# Patient Record
Sex: Female | Born: 1995 | Race: White | Hispanic: No | Marital: Married | Smoking: Never smoker
Health system: Southern US, Community
[De-identification: ages and names within clinical notes are randomized; demographics above are authoritative.]

## PROBLEM LIST (undated history)

## (undated) DIAGNOSIS — O3680X0 Pregnancy with inconclusive fetal viability, not applicable or unspecified: Secondary | ICD-10-CM

## (undated) DIAGNOSIS — Z8041 Family history of malignant neoplasm of ovary: Secondary | ICD-10-CM

## (undated) DIAGNOSIS — O009 Unspecified ectopic pregnancy without intrauterine pregnancy: Secondary | ICD-10-CM

## (undated) DIAGNOSIS — K661 Hemoperitoneum: Secondary | ICD-10-CM

## (undated) HISTORY — DX: Unspecified ectopic pregnancy without intrauterine pregnancy: O00.90

## (undated) HISTORY — DX: Family history of malignant neoplasm of ovary: Z80.41

## (undated) HISTORY — PX: ARTHROSCOPIC REPAIR ACL: SUR80

## (undated) MED ORDER — ERYTHROMYCIN 5 MG/G EYE OINTMENT
5 mg/gram (0. %) | OPHTHALMIC | Status: AC
Start: ? — End: 2013-05-17

## (undated) MED ORDER — DEXTROMETHORPHAN-GUAIFENESIN SR 30 MG-600 MG 12 HR TAB
600-30 mg | ORAL_TABLET | Freq: Two times a day (BID) | ORAL | Status: DC
Start: ? — End: 2014-03-29

---
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
## 2014-03-29 ENCOUNTER — Inpatient Hospital Stay
Admit: 2014-03-29 | Discharge: 2014-03-30 | Disposition: A | Discharge status: Home / Self Care | Payer: BLUE CROSS/BLUE SHIELD | Attending: Emergency Medicine

## 2014-03-29 DIAGNOSIS — N390 Urinary tract infection, site not specified: Secondary | ICD-10-CM

## 2014-03-29 NOTE — ED Notes (Signed)
Had a fever yesterday and headaches x 3 days and body aches with vomiting yesterday.  VOmiting x 6 in last 24 hours.

## 2014-03-29 NOTE — ED Notes (Signed)
Discharged by provider.

## 2014-03-29 NOTE — ED Provider Notes (Signed)
HPI Comments: Deanna Hooper is a 18 y.o. female who presents ambulatory to ER with c/o nausea, vomiting, and headache since past few days. Notes headache began approx 3 days ago and has been off and on in nature. Notes began with nausea, vomiting, and diarrhea yesterday. Denies any abdominal pain, numbness, weakness, blurred vision, and other visual changes. Denies taking anything for sx at home. States nausea and vomiting increasing over the past day and states "I can't keep anything down". Notes multiple people in household with similar sx that began after her sx. Denies any other complaints.   She specifically denies any fevers, chills, chest pain, shortness of breath, rash,  abdominal pain, urinary/bowel changes, sweating or weight loss.    PCP: None   PMHx significant for: Past Medical History:    MRSA infection                                                PSHx significant for: Past Surgical History:    HX ORTHOPAEDIC                                                Social Hx: Tobacco use: denies; EtOH use: denies; Illicit Drug use: denies     -- Chlorhexidine -- Unknown (comments)    --  States she's allergic    There are no other complaints, changes or physical findings at this time.        The history is provided by the patient.        Past Medical History:   Diagnosis Date   ??? MRSA infection        Past Surgical History:   Procedure Laterality Date   ??? Hx orthopaedic           History reviewed. No pertinent family history.    History     Social History   ??? Marital Status: SINGLE     Spouse Name: N/A     Number of Children: N/A   ??? Years of Education: N/A     Occupational History   ??? Not on file.     Social History Main Topics   ??? Smoking status: Never Smoker    ??? Smokeless tobacco: Never Used   ??? Alcohol Use: No   ??? Drug Use: No   ??? Sexual Activity: Not on file     Other Topics Concern   ??? Not on file     Social History Narrative                ALLERGIES: Chlorhexidine      Review of Systems    Constitutional: Negative.  Negative for fever, chills, appetite change and fatigue.   HENT: Negative for congestion and sore throat.    Eyes: Negative.  Negative for visual disturbance.   Respiratory: Negative.  Negative for cough and shortness of breath.    Cardiovascular: Negative.  Negative for chest pain, palpitations and leg swelling.   Gastrointestinal: Positive for nausea and vomiting. Negative for abdominal pain, diarrhea and constipation.   Genitourinary: Negative.  Negative for dysuria, hematuria, flank pain, vaginal bleeding and vaginal discharge.   Musculoskeletal: Negative.  Negative for back pain and neck pain.  Skin: Negative.  Negative for rash.   Neurological: Positive for headaches. Negative for dizziness, syncope, weakness and numbness.   Hematological: Negative.    Psychiatric/Behavioral: Negative.    All other systems reviewed and are negative.      Filed Vitals:    03/29/14 1903   BP: 119/71   Pulse: 91   Temp: 98.9 ??F (37.2 ??C)   Resp: 18   Height: 5\' 8"  (1.727 m)   Weight: 79.379 kg (175 lb)   SpO2: 98%            Physical Exam   Constitutional: She is oriented to person, place, and time. She appears well-developed and well-nourished. No distress.   HENT:   Head: Normocephalic and atraumatic.   Right Ear: Hearing, tympanic membrane, external ear and ear canal normal.   Left Ear: Hearing, tympanic membrane, external ear and ear canal normal.   Nose: Nose normal. Right sinus exhibits no maxillary sinus tenderness and no frontal sinus tenderness. Left sinus exhibits no maxillary sinus tenderness and no frontal sinus tenderness.   Mouth/Throat: Uvula is midline, oropharynx is clear and moist and mucous membranes are normal. No oropharyngeal exudate, posterior oropharyngeal edema, posterior oropharyngeal erythema or tonsillar abscesses.   Eyes: Conjunctivae and EOM are normal. Pupils are equal, round, and reactive to light.   Neck: Normal range of motion. Neck supple.    Cardiovascular: Normal rate, S1 normal, S2 normal, normal heart sounds, intact distal pulses and normal pulses.  Exam reveals no gallop and no friction rub.    No murmur heard.  Pulses:       Dorsalis pedis pulses are 2+ on the right side, and 2+ on the left side.   Pulmonary/Chest: Effort normal and breath sounds normal. No accessory muscle usage. No respiratory distress. She has no decreased breath sounds. She has no wheezes. She has no rhonchi. She has no rales.   Abdominal: Soft. Normal appearance and bowel sounds are normal. She exhibits no distension. There is no hepatosplenomegaly. There is no tenderness. There is no rebound, no guarding and no CVA tenderness.   Musculoskeletal: Normal range of motion. She exhibits no edema or tenderness.   Lymphadenopathy:     She has no cervical adenopathy.   Neurological: She is alert and oriented to person, place, and time. She has normal strength. No sensory deficit.   Skin: Skin is warm and dry. No rash noted. She is not diaphoretic. No erythema. No pallor.   Psychiatric: She has a normal mood and affect. Her behavior is normal.   Nursing note and vitals reviewed.       MDM  Number of Diagnoses or Management Options  Nausea with vomiting:   Urinary tract infection without hematuria, site unspecified:   Diagnosis management comments: DDx: UTI, gastroenteritis, URI, sinusitis       Amount and/or Complexity of Data Reviewed  Clinical lab tests: ordered and reviewed  Discuss the patient with other providers: yes    Patient Progress  Patient progress: stable      Procedures                                                    10:03 PM  Pt has been reevaluated. There are no new complaints, changes, or physical findings at this time. Medications have been reviewed w/ pt and/or  family. Pt and/or family's questions have been answered. Pt and/or family expressed good understanding of the dx/tx/rx and is in agreement with plan  of care. Pt instructed and agreed to f/u w/ PCP and to return to ED upon further deterioration. Pt is ready for discharge.    LABORATORY TESTS:  Recent Results (from the past 12 hour(s))   URINALYSIS W/ REFLEX CULTURE    Collection Time: 03/29/14  7:29 PM   Result Value Ref Range    Color YELLOW/STRAW      Appearance CLEAR CLEAR      Specific gravity 1.025 1.003 - 1.030      pH (UA) 6.5 5.0 - 8.0      Protein TRACE (A) NEG mg/dL    Glucose NEGATIVE  NEG mg/dL    Ketone TRACE (A) NEG mg/dL    Bilirubin NEGATIVE  NEG      Blood NEGATIVE  NEG      Urobilinogen 1.0 0.2 - 1.0 EU/dL    Nitrites NEGATIVE  NEG      Leukocyte Esterase SMALL (A) NEG      WBC 5-10 0 - 4 /hpf    RBC 0-5 0 - 5 /hpf    Epithelial cells MANY (A) FEW /lpf    Bacteria 2+ (A) NEG /hpf    UA:UC IF INDICATED URINE CULTURE ORDERED (A) CNI      Mucus 2+ (A) NEG /lpf   HCG URINE, QL. - POC    Collection Time: 03/29/14  9:51 PM   Result Value Ref Range    Pregnancy test,urine (POC) NEGATIVE  NEG         IMAGING RESULTS:  NA    MEDICATIONS GIVEN:  Medications   ondansetron (ZOFRAN) injection 4 mg (4 mg IntraVENous Given 03/29/14 2155)   butalbital-acetaminophen-caffeine (FIORICET, ESGIC) per tablet 1 Tab (1 Tab Oral Given 03/29/14 2155)       IMPRESSION:  1. Urinary tract infection without hematuria, site unspecified    2. Nausea with vomiting        PLAN:  1. Rx keflex, zofran  2. F/u with PCP      Return to ED if worse

## 2014-03-29 NOTE — ED Notes (Deleted)
Pt. Ambulatory to the bathroom.

## 2014-03-30 LAB — URINALYSIS W/ REFLEX CULTURE
Bilirubin: NEGATIVE
Blood: NEGATIVE
Glucose: NEGATIVE mg/dL
Nitrites: NEGATIVE
Specific gravity: 1.025 (ref 1.003–1.030)
Urobilinogen: 1 EU/dL (ref 0.2–1.0)
pH (UA): 6.5 (ref 5.0–8.0)

## 2014-03-30 LAB — HCG URINE, QL. - POC: Pregnancy test,urine (POC): NEGATIVE

## 2014-03-30 MED ORDER — ONDANSETRON (PF) 4 MG/2 ML INJECTION
4 mg/2 mL | INTRAMUSCULAR | Status: AC
Start: 2014-03-30 — End: 2014-03-29
  Administered 2014-03-30: 03:00:00 via INTRAVENOUS

## 2014-03-30 MED ORDER — CEPHALEXIN 500 MG CAP
500 mg | ORAL_CAPSULE | Freq: Three times a day (TID) | ORAL | Status: AC
Start: 2014-03-30 — End: 2014-04-05

## 2014-03-30 MED ORDER — BUTALBITAL-ACETAMINOPHEN-CAFFEINE 50 MG-325 MG-40 MG TAB
50-325-40 mg | ORAL | Status: AC
Start: 2014-03-30 — End: 2014-03-29
  Administered 2014-03-30: 03:00:00 via ORAL

## 2014-03-30 MED ORDER — ONDANSETRON 4 MG TAB, RAPID DISSOLVE
4 mg | ORAL_TABLET | Freq: Three times a day (TID) | ORAL | Status: DC | PRN
Start: 2014-03-30 — End: 2015-06-04

## 2014-03-30 MED FILL — BUTALBITAL-ACETAMINOPHEN-CAFFEINE 50 MG-325 MG-40 MG TAB: 50-325-40 mg | ORAL | Qty: 1

## 2014-03-30 MED FILL — ONDANSETRON (PF) 4 MG/2 ML INJECTION: 4 mg/2 mL | INTRAMUSCULAR | Qty: 2

## 2014-03-31 LAB — CULTURE, URINE
Colonies Counted: >100000
Colony Count: >100000

## 2015-06-04 ENCOUNTER — Inpatient Hospital Stay
Admit: 2015-06-04 | Discharge: 2015-06-05 | Disposition: A | Discharge status: Home / Self Care | Payer: BLUE CROSS/BLUE SHIELD | Attending: Emergency Medicine

## 2015-06-04 DIAGNOSIS — R112 Nausea with vomiting, unspecified: Secondary | ICD-10-CM

## 2015-06-04 LAB — URINALYSIS W/ REFLEX CULTURE
Bilirubin: NEGATIVE
Blood: NEGATIVE
Glucose: NEGATIVE mg/dL
Ketone: NEGATIVE mg/dL
Leukocyte Esterase: NEGATIVE
Nitrites: NEGATIVE
Protein: NEGATIVE mg/dL
Specific gravity: 1.03 (ref 1.003–1.030)
Urobilinogen: 1 EU/dL (ref 0.2–1.0)
pH (UA): 6 (ref 5.0–8.0)

## 2015-06-04 LAB — METABOLIC PANEL, COMPREHENSIVE
A-G Ratio: 0.9 — ABNORMAL LOW (ref 1.1–2.2)
ALT (SGPT): 23 U/L (ref 12–78)
AST (SGOT): 15 U/L (ref 15–37)
Albumin: 3.6 g/dL (ref 3.5–5.0)
Alk. phosphatase: 69 U/L (ref 45–117)
Anion gap: 8 mmol/L (ref 5–15)
BUN/Creatinine ratio: 14 (ref 12–20)
BUN: 11 MG/DL (ref 6–20)
Bilirubin, total: 0.2 MG/DL (ref 0.2–1.0)
CO2: 27 mmol/L (ref 21–32)
Calcium: 8.4 MG/DL — ABNORMAL LOW (ref 8.5–10.1)
Chloride: 105 mmol/L (ref 97–108)
Creatinine: 0.81 MG/DL (ref 0.55–1.02)
GFR est AA: >60 mL/min/{1.73_m2} (ref >60)
GFR est non-AA: >60 mL/min/{1.73_m2} (ref >60)
Globulin: 3.8 g/dL (ref 2.0–4.0)
Glucose: 96 mg/dL (ref 65–100)
Potassium: 4 mmol/L (ref 3.5–5.1)
Protein, total: 7.4 g/dL (ref 6.4–8.2)
Sodium: 140 mmol/L (ref 136–145)

## 2015-06-04 LAB — CBC WITH AUTOMATED DIFF
ABS. BASOPHILS: 0 10*3/uL (ref 0.0–0.1)
ABS. EOSINOPHILS: 0.2 10*3/uL (ref 0.0–0.4)
ABS. LYMPHOCYTES: 1 10*3/uL (ref 0.8–3.5)
ABS. MONOCYTES: 0.5 10*3/uL (ref 0.0–1.0)
ABS. NEUTROPHILS: 3.6 10*3/uL (ref 1.8–8.0)
BASOPHILS: 0 % (ref 0–1)
EOSINOPHILS: 3 % (ref 0–7)
HCT: 40.4 % (ref 35.0–47.0)
HGB: 12.6 g/dL (ref 11.5–16.0)
LYMPHOCYTES: 20 % (ref 12–49)
MCH: 27.5 PG (ref 26.0–34.0)
MCHC: 31.2 g/dL (ref 30.0–36.5)
MCV: 88.2 FL (ref 80.0–99.0)
MONOCYTES: 9 % (ref 5–13)
NEUTROPHILS: 68 % (ref 32–75)
PLATELET: 219 10*3/uL (ref 150–400)
RBC: 4.58 M/uL (ref 3.80–5.20)
RDW: 13.1 % (ref 11.5–14.5)
WBC: 5.3 10*3/uL (ref 3.6–11.0)

## 2015-06-04 LAB — HCG URINE, QL. - POC: Pregnancy test,urine (POC): NEGATIVE

## 2015-06-04 LAB — LIPASE: Lipase: 174 U/L (ref 73–393)

## 2015-06-04 MED ORDER — ONDANSETRON 4 MG TAB, RAPID DISSOLVE
4 mg | ORAL_TABLET | Freq: Three times a day (TID) | ORAL | 0 refills | Status: AC | PRN
Start: 2015-06-04 — End: ?

## 2015-06-04 MED ORDER — ONDANSETRON (PF) 4 MG/2 ML INJECTION
4 mg/2 mL | INTRAMUSCULAR | Status: AC
Start: 2015-06-04 — End: 2015-06-04
  Administered 2015-06-04: 23:00:00 via INTRAVENOUS

## 2015-06-04 MED ORDER — DICYCLOMINE 10 MG/ML IM SOLN
10 mg/mL | INTRAMUSCULAR | Status: AC
Start: 2015-06-04 — End: 2015-06-04
  Administered 2015-06-04: 23:00:00 via INTRAMUSCULAR

## 2015-06-04 MED ORDER — SODIUM CHLORIDE 0.9% BOLUS IV
0.9 % | Freq: Once | INTRAVENOUS | Status: AC
Start: 2015-06-04 — End: 2015-06-04
  Administered 2015-06-04: 23:00:00 via INTRAVENOUS

## 2015-06-04 MED ORDER — DICYCLOMINE 20 MG TAB
20 mg | ORAL_TABLET | Freq: Four times a day (QID) | ORAL | 0 refills | Status: AC
Start: 2015-06-04 — End: 2015-06-09

## 2015-06-04 MED FILL — BENTYL 10 MG/ML INTRAMUSCULAR SOLUTION: 10 mg/mL | INTRAMUSCULAR | Qty: 2

## 2015-06-04 MED FILL — ONDANSETRON (PF) 4 MG/2 ML INJECTION: 4 mg/2 mL | INTRAMUSCULAR | Qty: 2

## 2015-06-04 MED FILL — SODIUM CHLORIDE 0.9 % IV: INTRAVENOUS | Qty: 1000

## 2015-06-04 NOTE — ED Notes (Signed)
Patient c/o n/v/d since Sunday 0200.  She stated she can not keep any food down but liquid.

## 2015-06-04 NOTE — ED Provider Notes (Signed)
HPI Comments: PAtient presents with nausea, vomiting and diarrhea x 2 days. Patient works at [employer] that has had norovirus. Patient denies fever, chills or abdominal pain. Patient denies any significant medical history.     Patient is a 20 y.o. female presenting with vomiting and diarrhea. The history is provided by the patient.   Vomiting    This is a new problem. The current episode started 2 days ago. The problem has not changed since onset.The emesis has an appearance of stomach contents. There has been no fever. Associated symptoms include diarrhea. Pertinent negatives include no chills, no fever, no sweats, no abdominal pain, no headaches, no arthralgias, no myalgias, no cough, no URI and no headaches. The patient is not pregnant.Her pertinent negatives include no irritable bowel syndrome, no inflammatory bowel disease, no short gut syndrome, no bowel resection, no recent abdominal surgery, no malabsorption, no gastric bypass and no DM.   Diarrhea    Associated symptoms include diarrhea and vomiting. Pertinent negatives include no fever, no headaches, no arthralgias and no myalgias. Her past medical history does not include irritable bowel syndrome or DM.        Past Medical History:   Diagnosis Date   ??? MRSA infection        Past Surgical History:   Procedure Laterality Date   ??? Hx orthopaedic           No family history on file.    Social History     Social History   ??? Marital status: SINGLE     Spouse name: N/A   ??? Number of children: N/A   ??? Years of education: N/A     Occupational History   ??? Not on file.     Social History Main Topics   ??? Smoking status: Never Smoker   ??? Smokeless tobacco: Never Used   ??? Alcohol use No   ??? Drug use: No   ??? Sexual activity: Not on file     Other Topics Concern   ??? Not on file     Social History Narrative         ALLERGIES: Chlorhexidine    Review of Systems   Constitutional: Negative.  Negative for chills and fever.   HENT: Negative.    Eyes: Negative.     Respiratory: Negative.  Negative for cough.    Cardiovascular: Negative.    Gastrointestinal: Positive for diarrhea and vomiting. Negative for abdominal pain.   Endocrine: Negative.    Genitourinary: Negative.    Musculoskeletal: Negative.  Negative for arthralgias and myalgias.   Skin: Negative.    Allergic/Immunologic: Negative.    Neurological: Negative.  Negative for headaches.   Hematological: Negative.    Psychiatric/Behavioral: Negative.    All other systems reviewed and are negative.      Vitals:    06/04/15 1805 06/04/15 1815 06/04/15 1830 06/04/15 1845   BP:  123/66 120/59 121/61   Pulse:       Resp:       Temp:       SpO2: 99% 100%     Weight:       Height:                Physical Exam   Constitutional: She is oriented to person, place, and time. She appears well-developed and well-nourished.  Non-toxic appearance. She does not have a sickly appearance. She does not appear ill. No distress.   Patient is well appearing and in no acute  distress. Patient is non-toxic appearing.      HENT:   Head: Normocephalic and atraumatic.   Right Ear: External ear normal.   Left Ear: External ear normal.   Mouth/Throat: Oropharynx is clear and moist. No oropharyngeal exudate.   Eyes: Conjunctivae and EOM are normal. Pupils are equal, round, and reactive to light. Right eye exhibits no discharge. Left eye exhibits no discharge. No scleral icterus.   Neck: Normal range of motion. No tracheal deviation present. No thyromegaly present.   Cardiovascular: Normal rate, regular rhythm and normal heart sounds.    No murmur heard.  Pulmonary/Chest: Effort normal and breath sounds normal. No respiratory distress. She has no wheezes. She has no rales. She exhibits no tenderness.   Abdominal: Soft. Normal appearance and bowel sounds are normal. She exhibits no distension. There is no tenderness. There is no rebound and no guarding.   Musculoskeletal: Normal range of motion. She exhibits no edema or tenderness.   Lymphadenopathy:      She has no cervical adenopathy.   Neurological: She is alert and oriented to person, place, and time. No cranial nerve deficit. Coordination normal.   Skin: Skin is warm. No erythema.   Psychiatric: She has a normal mood and affect. Her behavior is normal. Judgment and thought content normal.   Nursing note and vitals reviewed.       MDM  Number of Diagnoses or Management Options  Nausea and vomiting, intractability of vomiting not specified, unspecified vomiting type:   Diagnosis management comments: Assesment/Plan- negative work up in ED. Patient feeling improved. Discharge with PCP follow up.       Amount and/or Complexity of Data Reviewed  Clinical lab tests: ordered and reviewed      ED Course       Procedures

## 2015-06-04 NOTE — ED Notes (Signed)
Patient discharged by PA. Patient given the opportunity to ask questions, verbalized understanding of teaching. PA

## 2015-06-06 LAB — CULTURE, URINE
Colonies Counted: 40000
Colony Count: 40000

## 2016-07-10 DIAGNOSIS — F4310 Post-traumatic stress disorder, unspecified: Secondary | ICD-10-CM | POA: Insufficient documentation

## 2016-10-03 ENCOUNTER — Encounter

## 2016-10-03 ENCOUNTER — Encounter: Admit: 2016-10-03

## 2016-10-03 ENCOUNTER — Ambulatory Visit: Admit: 2016-10-03 | Discharge: 2016-10-03 | Payer: PRIVATE HEALTH INSURANCE | Attending: Obstetrics & Gynecology

## 2016-10-03 ENCOUNTER — Ambulatory Visit: Admit: 2016-10-03 | Discharge: 2016-10-03 | Payer: PRIVATE HEALTH INSURANCE

## 2016-10-03 DIAGNOSIS — O3680X0 Pregnancy with inconclusive fetal viability, not applicable or unspecified: Secondary | ICD-10-CM

## 2016-10-03 DIAGNOSIS — Z3400 Encounter for supervision of normal first pregnancy, unspecified trimester: Secondary | ICD-10-CM

## 2016-10-03 NOTE — Progress Notes (Signed)
[facility]  [email or web address]  [phone]    Alfonse Ras, MD, FACOG     Chief complaint:  Irregular cycles  Last cycle; Patient's last menstrual period was 07/30/2016.    This is a new concern and an evaluation is planned.    Current pregnancy history:  Deanna Hooper is a No obstetric history on file., 21 y.o. female WHITE OR CAUCASIAN   She presents for the evaluation of irregular menses and a positive pregnancy test.    LMP history:  Patient's last menstrual period was 07/30/2016..  The date of the beginning of her last menstrual period is certain.    Her menses are regular.    Her cycles occur about every 4 weeks.  A urine pregnancy test was positive about 4 weeks ago.   She was not using contraception at the estimated time of conception.  Pt stopped OCP 1 month prior.    Based on her LMP, her EGA is 9 weeks,2 days with and EDC of 05/16/17.     Ultrasound data:  She had an ultrasound today which revealed a viable singleton pregnancy with a gestational age of [redacted] weeks and 3 days giving an EDC of 05/12/17.    Pregnancy symptoms:  She reports breast tenderness  nausea  positive home pregnancy test.  She denies fatigue  "morning sickness"  frequent urination  Pelvic pain  Vaginal bleeding.  Since she found out she is pregnant, she has gained, 20 lbs.  She reports her prepregnancy weight as 216 pounds.    Relevant past pregnancy history:  She has the following pregnancy history:none.  She does not have a history of preterm delivery.  She does not have a history of a prior cesarean section.    Relevant past medical history:(relevant to this pregnancy):   none     Pap smear history:  Last pap smear: pt has never had one.   Results: N/A    Occupational history  Her occupation is: unemployed.    Substance history:   She does not report current tobacco use.           She does not report current alcohol use.  She does not report current drug use.    Exposure history:    There are not indoor cat(s) in the home.  The patient was instructed not to change cat litter boxes during pregnancy.  She does report close contact with children on a regular basis.   She has chicken pox or the vaccine in the past.   Patient does not report issues with domestic violence.     Genetic Screening/Teratology Counseling:   (Includes patient, baby's father, or anyone in either family with:)  1.  Patient's age >/= 46 at EDC?--21      FOB age: 21 years old.   2.  Thalassemia ([place], [place], Mediterranean, or Asian background): MCV<80?--no  3.  Neural tube defect (meningomyelocele, spina bifida, anencephaly)?--fob: spinal defect? (stenosis and scoliosis)  4.  Congenital heart defect?--pt fob: Aortic stenosis  5.  Down syndrome?--no  6.  Tay-Sachs (Jewish, [place]--no  7.  Canavan's Disease?--no  8.  Familial Dysautonomia?--no   9.  Sickle cell disease or trait (African)?--no   Has she been tested for sickle trait: No  10.  Hemophilia or other blood disorders?--no  11.  Muscular dystrophy?--no  12.  Cystic fibrosis?--no  13.  Huntington's Chorea?--no  14.  Mental retardation/autism (if yes was person tested for Fragile X)?-no  15.  Other  inherited genetic or chromosomal disorder?- no  16.  Maternal metabolic disorder (DM, PKU, etc)?--no  17.  Patient or FOB with a child with a birth defect not listed above?--no  17a.  Patient or FOB with a birth defect themselves?--no  18.  Recurrent pregnancy loss, or stillbirth?--no  19.  Any medications since LMP other than prenatal vitamins (include vitamins, supplements, OTC meds, drugs, alcohol)?--   PNV only  20.  Any other genetic/environmental exposure to discuss?--no.    Infection History:  1.  Lives with someone with TB or TB exposed?--no  2.  Patient or partner has history of genital herpes?--no  3.  Rash or viral illness since LMP?--no  4.  History of STD (GC, CT, HPV, syphilis, HIV)?--no   5.  Have you received a flu vaccine for the most recent flu season? -- no  6.  Have you or your sexual partner(s) travelled to a [place] invested area in the last 3 months? -- no    Past Medical History:   Diagnosis Date   ??? MRSA infection      Past Surgical History:   Procedure Laterality Date   ??? HX ORTHOPAEDIC  2003    right wrist   ??? HX ORTHOPAEDIC  10/06/2012    right knee/ ACL     Social History     Occupational History   ??? Not on file.     Social History Main Topics   ??? Smoking status: Never Smoker   ??? Smokeless tobacco: Never Used   ??? Alcohol use No   ??? Drug use: No   ??? Sexual activity: Yes     Partners: Male     Birth control/ protection: None     Family History   Problem Relation Age of Onset   ??? Hypertension Father    ??? Diabetes Father    ??? Heart Disease Father      aortic stenosis, mechanical heart valve   ??? Other Father      gastroparesis   ??? Diabetes Sister      OB History   Gravida Para Term Preterm AB Living   1        SAB TAB Ectopic Molar Multiple Live Births              # Outcome Date GA Lbr Len/2nd Weight Sex Delivery Anes PTL Lv   1 Current                 Allergies   Allergen Reactions   ??? Chlorhexidine Unknown (comments)     States she's allergic     Prior to Admission medications    Medication Sig Start Date End Date Taking? Authorizing Provider   PNV no.95/ferrous fum/folic ac (PRENATAL PO) Take  by mouth.   Yes Historical Provider   ondansetron (ZOFRAN ODT) 4 mg disintegrating tablet Take 1 Tab by mouth every eight (8) hours as needed for Nausea. 06/04/15   Salomon Fick, PA        Review of Systems - History obtained from the patient  Constitutional: negative for weight loss, fever, night sweats  HEENT: negative for hearing loss, earache, congestion, snoring, sorethroat  CV: negative for chest pain, palpitations, edema  Resp: negative for cough, shortness of breath, wheezing  GI: negative for change in bowel habits, abdominal pain, black or bloody stools   GU: negative for frequency, dysuria, hematuria, vaginal discharge  MSK: negative for back pain, joint pain, muscle pain  Breast: negative  for breast lumps, nipple discharge, galactorrhea  Skin :negative for itching, rash, hives  Neuro: negative for dizziness, headache, confusion, weakness  Psych: negative for anxiety, depression, change in mood  Heme/lymph: negative for bleeding, bruising, pallor    Objective:  Visit Vitals   ??? BP 118/74   ??? Ht 5\' 8"  (1.727 m)   ??? Wt 236 lb (107 kg)   ??? LMP 07/30/2016   ??? BMI 35.88 kg/m2       Physical Exam:   Constitutional  ?? Appearance: well-nourished, well developed, alert, in no acute distress    HENT  ?? Head  ?? Face: appears normal  ?? Eyes: appear normal  ?? Ears: normal  ?? Mouth: normal  ?? Lips: no lesions    Neck  ?? Inspection/Palpation: normal appearance, no masses or tenderness  ?? Lymph Nodes: no lymphadenopathy present  ?? Thyroid: gland size normal, nontender, no nodules or masses present on palpation    Chest  ?? Respiratory Effort: breathing unlabored  ?? Auscultation: normal breath sounds    Cardiovascular  ?? Heart:  ?? Auscultation: regular rate and rhythm without murmur    Breasts  ?? Inspection of Breasts: breasts symmetrical, no skin changes, no discharge present, nipple appearance normal, no skin retraction present  ?? Palpation of Breasts and Axillae: no masses present on palpation, no breast tenderness  ?? Axillary Lymph Nodes: no lymphadenopathy present    Gastrointestinal  ?? Abdominal Examination: abdomen non-tender to palpation, normal bowel sounds, no masses present  ?? Liver and spleen: no hepatomegaly present, spleen not palpable  ?? Hernias: no hernias identified    Genitourinary  ?? External Genitalia: normal appearance for age, no discharge present, no tenderness present, no inflammatory lesions present, no masses present, no atrophy present  ?? Vagina: normal vaginal vault without central or paravaginal defects, no  discharge present, no inflammatory lesions present, no masses present  ?? Bladder: non-tender to palpation  ?? Urethra: appears normal  ?? Cervix: normal appearing with discharge or lesions, os closed  ?? Uterus: enlarged, normal shape, soft  ?? Adnexa: no adnexal tenderness present, no adnexal masses present  ?? Perineum: perineum within normal limits, no evidence of trauma, no rashes or skin lesions present  ?? Anus: anus within normal limits, no hemorrhoids present  ?? Inguinal Lymph Nodes: no lymphadenopathy present    Skin  ?? General Inspection: no rash, no lesions identified    Neurologic/Psychiatric  ?? Mental Status:  ?? Orientation: grossly oriented to person, place and time  ?? Mood and Affect: mood normal, affect appropriate    Assessment:   Irregular cycles  Encounter Diagnosis   Name Primary?   ??? Supervision of normal first pregnancy, antepartum Yes   FH spinal problem and aortic stenosis.  nauses  Due date: [place] EDC: stopped OCP    Plan:   We discussed options of genetic screening and diagnostic testing including:  CF testing, CVS, amniocentesis first trimester screening/NT, MSAFP, and NIPT (handout given to patient for review and consent)  She is not interested in prenatal genetic testing of her fetus, but she will notify us if desires testing/screening  Plan: FS  The course of pregnancy discussed including visit schedule, ultrasounds, lab testing, etc.  Pt advised to avoid alcoholic beverages and illicit/recreational drugs use  Recommend taking prenatal vitamins or folic acid daily with DHA/fish oil.  The hospital and practice style discussed with coverage system.  We discussed nutrition, toxoplasmosis precautions, sexual activity, exercise, need for influenza vaccine,  environmental and work hazards, travel advice, screen for domestic violence, need for seat belts.  We discussed seafood, unpasteurized dairy products, deli meat, artificial sweeteners, and caffeine intake.     We recommend avoiding chemical and toxin exposures when possible.   Information on prenatal and breastfeeding classes given.  Information on circumcision given  Patient encouraged not to smoke.  Discussed current prescription drug use. Given medication list.  Discussed the use of over the counter medications and chemicals.  She is advised to contact her MD with any questions.     Pt understands risk of hemorrhage during pregnancy and post delivery and would accept blood products if necessary in life-threatening emergencies  We discussed signs and symptoms of abnormal pregnancies and miscarriage.  Handouts given to pt.  N/v handout, consider diclegis if NI    Physician review of ultrasound performed by technician  Today's ultrasound report and images were reviewed and discussed with the patient.  Please see images and imaging report entered by technician in PACS for more detail and progress note and diagnosis entered by MD.    Baruch MerlAMARA Levia Waltermire, MD    TA ULTRASOUND PERFORMED.  A SINGLE VIABLE [redacted]W[redacted]D IUP IS SEEN WITH NORMAL CARDIAC RHYTHM.  GESTATIONAL AGE BASED ON TODAYS US.  A NORMAL APPEARING YOLK SAC IS SEEN.  RIGHT & LEFT OVARIES APPEAR WITHIN NORMAL LIMITS.  NO FREE FLUID SEEN IN THE CDS.    Orders Placed This Encounter   ??? CULTURE, URINE   ??? HEP B SURFACE AG   ??? HIV SCREEN, 4TH GEN. W/REFLEX CONFIRM   ??? CBC W/O DIFF   ??? RUBELLA AB, IGG   ??? T PALLIDUM SCREEN W/REFLEX   ??? TYPE, ABO & RH   ??? ANTIBODY SCREEN   ??? PNV no.95/ferrous fum/folic ac (PRENATAL PO)   ??? PAP IG, CT-NG, RFX HPV [id number])     Follow-up Disposition: Not on File

## 2016-10-03 NOTE — Progress Notes (Signed)
Physician review of ultrasound performed by technician    Today's ultrasound report and images were reviewed and discussed with the patient.  Please see images and imaging report entered by technician in PACS for more detail and progress note and diagnosis entered by MD.    Jamariah Tony, MD

## 2016-10-03 NOTE — Patient Instructions (Signed)
Weeks 6 to 10 of Your Pregnancy: Care Instructions  Your Care Instructions    Congratulations on your pregnancy. This is an exciting and important time for you.  During the first 6 to 10 weeks of your pregnancy, your body goes through many changes. Your baby grows very fast, even though you cannot feel it yet. You may start to notice that you feel different, both in your body and your emotions. Because each woman's pregnancy is unique, there is no right way to feel. You may feel the healthiest you have ever been, or you may feel tired or sick to your stomach ("morning sickness").  These early weeks are a time to make healthy choices and to eat the best foods for you and your baby. This care sheet will give you some ideas.  This is also a good time to think about birth defects testing. These are tests done during pregnancy to look for possible problems with the baby. First trimester tests for birth defects can be done between 10 and 13 weeks of pregnancy, depending on the test. Talk with your doctor about what kinds of tests are available.  Follow-up care is a key part of your treatment and safety. Be sure to make and go to all appointments, and call your doctor if you are having problems. It's also a good idea to know your test results and keep a list of the medicines you take.  How can you care for yourself at home?  Eat well  ?? Eat at least 3 meals and 2 healthy snacks every day. Eat fresh, whole foods, including:  ?? 7 or more servings of bread, tortillas, cereal, rice, pasta, or oatmeal.  ?? 3 or more servings of vegetables, especially leafy green vegetables.  ?? 2 or more servings of fruits.  ?? 3 or more servings of milk, yogurt, or cheese.  ?? 2 or more servings of meat, turkey, chicken, fish, eggs, or dried beans.  ?? Drink plenty of fluids, especially water. Avoid sodas and other sweetened drinks.  ?? Choose foods that have important vitamins for your baby, such as calcium, iron, and folate.   ?? Dairy products, tofu, canned fish with bones, almonds, broccoli, dark leafy greens, corn tortillas, and fortified orange juice are good sources of calcium.  ?? Beef, poultry, liver, spinach, lentils, dried beans, fortified cereals, and dried fruits are rich in iron.  ?? Dark leafy greens, broccoli, asparagus, liver, fortified cereals, orange juice, peanuts, and almonds are good sources of folate.  ?? Avoid foods that could harm your baby.  ?? Do not eat raw or undercooked meat, chicken, or fish (such as sushi or raw oysters).  ?? Do not eat raw eggs or foods that contain raw eggs, such as Caesar dressing.  ?? Do not eat soft cheeses and unpasteurized dairy foods, such as Brie, feta, or blue cheese.  ?? Do not eat fish that contains a lot of mercury, such as shark, swordfish, tilefish, or king mackerel. Do not eat more than 6 ounces of tuna each week.  ?? Do not eat raw sprouts, especially alfalfa sprouts.  ?? Cut down on caffeine, such as coffee, tea, and cola.  Protect yourself and your baby  ?? Do not touch kitty litter or cat feces. They can cause an infection that could harm your baby.  ?? High body temperature can be harmful to your baby. So if you want to use a sauna or hot tub, be sure to talk to your doctor   about how to use it safely.  Cope with morning sickness  ?? Sip small amounts of water, juices, or shakes. Try drinking between meals, not with meals.  ?? Eat 5 or 6 small meals a day. Try dry toast or crackers when you first get up, and eat breakfast a little later.  ?? Avoid spicy, greasy, and fatty foods.  ?? When you feel sick, open your windows or go for a short walk to get fresh air.  ?? Try nausea wristbands. These help some women.  ?? Tell your doctor if you think your prenatal vitamins make you sick.  Where can you learn more?  Go to http://www.healthwise.net/[employer]Help[employer]ctions.  Enter G112 in the search box to learn more about "Weeks 6 to 10 of Your Pregnancy: Care Instructions."   Current as of: July 12, 2015  Content Version: 11.4  ?? 2006-2017 [employer]. Care instructions adapted under license by [employer] (which disclaims liability or warranty for this information). If you have questions about a medical condition or this instruction, always ask your healthcare professional. Healthwise, Incorporated disclaims any warranty or liability for your use of this information.

## 2016-10-05 LAB — CULTURE, URINE
Urine Culture, Routine: NO GROWTH
Urine Culture, Routine: NO GROWTH

## 2016-10-05 NOTE — Progress Notes (Signed)
Normal results, add to prenatal records.   We can review in detail with patient at next visit.

## 2016-10-07 LAB — PAP IG, CT-NG, RFX HPV ASCU(183194, 507301)
.: 0
CHLAMYDIA, NUC. ACID AMP, 186114: NEGATIVE
Chlamydia, Nuc. Acid Amp.: NEGATIVE
GONOCOCCUS, NUC. ACID AMP, 186122: NEGATIVE
Gonococcus, Nuc. Acid Amp.: NEGATIVE
LABCORP 019018: 0

## 2016-10-07 LAB — BLOOD TYPE, (ABO+RH)
Rh (D): POSITIVE
Rh Type: POSITIVE

## 2016-10-07 LAB — HIV 1/2 ANTIGEN/ANTIBODY, FOURTH GENERATION W/RFL: HIV Screen 4th Generation wRfx: NONREACTIVE

## 2016-10-07 LAB — HIV-1 AB, EXTERNAL
HIV, EXTERNAL: NEGATIVE
HIV, External: NEGATIVE

## 2016-10-07 LAB — TYPE, ABO & RH, EXTERNAL
ABO,Rh: B POS
TYPE, ABO & RH, EXTERNAL: B POS

## 2016-10-07 LAB — PAP SMEAR, EXTERNAL: Pap smear, External: NEGATIVE

## 2016-10-07 LAB — CBC W/O DIFF
HCT: 37.2 % (ref 34.0–46.6)
HGB: 12.6 g/dL (ref 11.1–15.9)
MCH: 28.5 pg (ref 26.6–33.0)
MCHC: 33.9 g/dL (ref 31.5–35.7)
MCV: 84 fL (ref 79–97)
PLATELET: 218 10*3/uL (ref 150–379)
RBC: 4.42 x10E6/uL (ref 3.77–5.28)
RDW: 13.7 % (ref 12.3–15.4)
WBC: 8.4 10*3/uL (ref 3.4–10.8)

## 2016-10-07 LAB — ANTIBODY SCREEN: Antibody screen: NEGATIVE

## 2016-10-07 LAB — N GONORRHOEAE, DNA PROBE, EXTERNAL: Gonorrhea, External: NEGATIVE

## 2016-10-07 LAB — URINALYSIS W/O MICRO, EXTERNAL: Urine, External: NEGATIVE

## 2016-10-07 LAB — HIV 1/2 AG/AB, 4TH GENERATION,W RFLX CONFIRM: HIV SCREEN 4TH GENERATION WRFX: NONREACTIVE

## 2016-10-07 LAB — HEPATITIS B SURFACE AG, EXTERNAL: HBsAg, External: NEGATIVE

## 2016-10-07 LAB — CHLAMYDIA DNA PROBE, EXTERNAL: Chlamydia, External: NEGATIVE

## 2016-10-07 LAB — RUBELLA AB, IGG, EXTERNAL: Rubella, External: NON-IMMUNE/NOT IMMUNE

## 2016-10-07 LAB — T PALLIDUM AB, EXTERNAL: T. Pallidum Antibody, External: NEGATIVE

## 2016-10-07 LAB — PLATELET, EXTERNAL: Platelet cnt., External: 218

## 2016-10-07 LAB — T PALLIDUM SCREEN W/REFLEX: T PALLIDUM AB: NEGATIVE

## 2016-10-07 LAB — HEMOGLOBIN, EXTERNAL: Hgb, External: 12.6

## 2016-10-07 LAB — RUBELLA AB, IGG: Rubella Ab, IgG: <0.9 index — ABNORMAL LOW (ref >0.99)

## 2016-10-07 LAB — HEP B SURFACE AG: Hep B surface Ag screen: NEGATIVE

## 2016-10-07 LAB — ANTIBODY SCREEN, EXTERNAL: Antibody screen, External: NEGATIVE

## 2016-10-07 LAB — HEMATOCRIT, EXTERNAL: Hct, External: 37.2

## 2016-10-07 NOTE — Progress Notes (Signed)
Abnormal results.  Please notify pt.appears to be RUBELLA non immune, other pnlabs wnl, can discuss in detail at [place]  Update PNL, encourage MC use and sign up  Notify pt: rubella NI, we will repeat this test at 28 weeks if she thinks he was vaccinated in the past and if still non immune will recommend MMR vaccine prior to leaving the hospital after delivery: add to PL  This vaccine cannot be given while pregnant

## 2016-10-10 DIAGNOSIS — Z3400 Encounter for supervision of normal first pregnancy, unspecified trimester: Secondary | ICD-10-CM

## 2016-10-10 HISTORY — DX: Encounter for supervision of normal first pregnancy, unspecified trimester: Z34.00

## 2016-10-10 NOTE — Progress Notes (Signed)
Patient aware of results and MD recommendations by phone.

## 2016-10-10 NOTE — Progress Notes (Signed)
LMTCB

## 2016-10-10 NOTE — Progress Notes (Signed)
PNL/PL updated.

## 2018-06-27 HISTORY — PX: DILATION AND EVACUATION: SHX1459

## 2018-07-12 ENCOUNTER — Other Ambulatory Visit: Payer: Self-pay

## 2018-07-12 ENCOUNTER — Encounter: Payer: Self-pay | Admitting: *Deleted

## 2018-07-12 ENCOUNTER — Emergency Department
Admission: EM | Admit: 2018-07-12 | Discharge: 2018-07-12 | Disposition: A | Discharge status: Home / Self Care | Payer: Medicaid Other | Attending: Emergency Medicine | Admitting: Emergency Medicine

## 2018-07-12 ENCOUNTER — Emergency Department: Payer: Medicaid Other

## 2018-07-12 DIAGNOSIS — O209 Hemorrhage in early pregnancy, unspecified: Secondary | ICD-10-CM

## 2018-07-12 DIAGNOSIS — Z3A00 Weeks of gestation of pregnancy not specified: Secondary | ICD-10-CM | POA: Diagnosis not present

## 2018-07-12 DIAGNOSIS — R109 Unspecified abdominal pain: Secondary | ICD-10-CM | POA: Diagnosis not present

## 2018-07-12 DIAGNOSIS — O208 Other hemorrhage in early pregnancy: Secondary | ICD-10-CM | POA: Insufficient documentation

## 2018-07-12 DIAGNOSIS — O26899 Other specified pregnancy related conditions, unspecified trimester: Secondary | ICD-10-CM | POA: Diagnosis not present

## 2018-07-12 LAB — COMPREHENSIVE METABOLIC PANEL
ALT: 13 U/L (ref 0–44)
AST: 21 U/L (ref 15–41)
Albumin: 4.4 g/dL (ref 3.5–5.0)
Alkaline Phosphatase: 72 U/L (ref 38–126)
Anion gap: 9 (ref 5–15)
BUN: 9 mg/dL (ref 6–20)
CO2: 23 mmol/L (ref 22–32)
Calcium: 9 mg/dL (ref 8.9–10.3)
Chloride: 107 mmol/L (ref 98–111)
Creatinine, Ser: 0.66 mg/dL (ref 0.44–1.00)
GFR calc Af Amer: >60 mL/min (ref >60)
GFR calc non Af Amer: >60 mL/min (ref >60)
Glucose, Bld: 91 mg/dL (ref 70–99)
Potassium: 4.1 mmol/L (ref 3.5–5.1)
Sodium: 139 mmol/L (ref 135–145)
Total Bilirubin: 0.6 mg/dL (ref 0.3–1.2)
Total Protein: 7.3 g/dL (ref 6.5–8.1)

## 2018-07-12 LAB — CBC WITH DIFFERENTIAL/PLATELET
ABS IMMATURE GRANULOCYTES: 0.02 10*3/uL (ref 0.00–0.07)
Basophils Absolute: 0 10*3/uL (ref 0.0–0.1)
Basophils Relative: 0 %
Eosinophils Absolute: 0.1 10*3/uL (ref 0.0–0.5)
Eosinophils Relative: 1 %
HCT: 41 % (ref 36.0–46.0)
Hemoglobin: 13.3 g/dL (ref 12.0–15.0)
Immature Granulocytes: 0 %
Lymphocytes Relative: 23 %
Lymphs Abs: 1.3 10*3/uL (ref 0.7–4.0)
MCH: 28.9 pg (ref 26.0–34.0)
MCHC: 32.4 g/dL (ref 30.0–36.0)
MCV: 88.9 fL (ref 80.0–100.0)
Monocytes Absolute: 0.4 10*3/uL (ref 0.1–1.0)
Monocytes Relative: 8 %
NEUTROS ABS: 3.8 10*3/uL (ref 1.7–7.7)
Neutrophils Relative %: 68 %
PLATELETS: 229 10*3/uL (ref 150–400)
RBC: 4.61 MIL/uL (ref 3.87–5.11)
RDW: 12.8 % (ref 11.5–15.5)
WBC: 5.6 10*3/uL (ref 4.0–10.5)
nRBC: 0 % (ref 0.0–0.2)

## 2018-07-12 LAB — ABO/RH: ABO/RH(D): B POS

## 2018-07-12 LAB — HCG, QUANTITATIVE, PREGNANCY: hCG, Beta Chain, Quant, S: 515 m[IU]/mL — ABNORMAL HIGH (ref <5)

## 2018-07-12 LAB — POCT PREGNANCY, URINE
PREG TEST UR: POSITIVE — AB
Preg Test, Ur: POSITIVE — AB

## 2018-07-12 NOTE — ED Notes (Signed)
Patient transported to Ultrasound 

## 2018-07-12 NOTE — ED Triage Notes (Signed)
Pt states that her last regular menstrual period was  1/8. She had a positive pregnancy test on Saturday and she is now having bleeding that is more than a regular period.  She states that she had some spotting on 2/4.  Pt is also having cramping that feels like labor.

## 2018-07-12 NOTE — ED Provider Notes (Addendum)
[facility] Emergency Department Provider Note   ____________________________________________   First MD Initiated Contact with Patient 07/12/18 1515     (approximate)  I have reviewed the triage vital signs and the nursing notes.   HISTORY  Chief Complaint Threatened Miscarriage    HPI Heather Jacobs is a 23 y.o. female G2 P1-0-0-1.  Patient reports last regular menstrual period was on January 8 she had small brief episode of spotting in the beginning of February and little bit of spotting again March 3.  She is now having more spotting and cramping.  Pregnancy test is positive.  Quant is 515.         History reviewed. No pertinent past medical history.  There are no active problems to display for this patient.   Past Surgical History:  Procedure Laterality Date  . ARTHROSCOPIC REPAIR ACL      Prior to Admission medications   Not on File    Allergies Patient has no known allergies.  History reviewed. No pertinent family history.  Social History Social History   Tobacco Use  . Smoking status: Never Smoker  . Smokeless tobacco: Never Used  Substance Use Topics  . Alcohol use: Never    Frequency: Never  . Drug use: Never    Review of Systems  Constitutional: No fever/chills Eyes: No visual changes. ENT: No sore throat. Cardiovascular: Denies chest pain. Respiratory: Denies shortness of breath. Gastrointestinal: AP lower abdominal pain.  No nausea, no vomiting.  No diarrhea.  No constipation. Genitourinary: Negative for dysuria. Musculoskeletal: Negative for back pain. Skin: Negative for rash. Neurological: Negative for headaches, focal weakness    ____________________________________________   PHYSICAL EXAM:  VITAL SIGNS: ED Triage Vitals  Enc Vitals Group     BP 07/12/18 1411 140/89     Pulse Rate 07/12/18 1411 92     Resp 07/12/18 1411 16     Temp 07/12/18 1411 98.2 F (36.8 C)     Temp Source 07/12/18 1411  Oral     SpO2 07/12/18 1411 100 %     Weight 07/12/18 1412 220 lb (99.8 kg)     Height --      Head Circumference --      Peak Flow --      Pain Score 07/12/18 1411 4     Pain Loc --      Pain Edu? --      Excl. in GC? --     Constitutional: Alert and oriented. Well appearing and in no acute distress. Eyes: Conjunctivae are normal. PERRL. EOMI. Head: Atraumatic. Nose: No congestion/rhinnorhea. Mouth/Throat: Mucous membranes are moist.  Oropharynx non-erythematous. Neck: No stridor. Cardiovascular: Normal rate, regular rhythm. Grossly normal heart sounds.  Good peripheral circulation. Respiratory: Normal respiratory effort.  No retractions. Lungs CTAB. Gastrointestinal: Soft mild lower abd pain. No distention. No abdominal bruits. No CVA tenderness. Genitourinary: os closed, occasional drops of blood expressed from os. No tenderness when cervix manipulated w. Speculum. Musculoskeletal: No lower extremity tenderness nor edema.  No joint effusions. Neurologic:  Normal speech and language. No gross focal neurologic deficits are appreciated. No gait instability. Skin:  Skin is warm, dry and intact. No rash noted. Psychiatric: Mood and affect are normal. Speech and behavior are normal.  ____________________________________________   LABS (all labs ordered are listed, but only abnormal results are displayed)  Labs Reviewed  HCG, QUANTITATIVE, PREGNANCY - Abnormal; Notable for the following components:      Result Value   hCG,  Beta Chain, Quant, S 515 (*)    All other components within normal limits  POCT PREGNANCY, URINE - Abnormal; Notable for the following components:   Preg Test, Ur POSITIVE (*)    All other components within normal limits  POCT PREGNANCY, URINE - Abnormal; Notable for the following components:   Preg Test, Ur POSITIVE (*)    All other components within normal limits  CBC WITH DIFFERENTIAL/PLATELET  COMPREHENSIVE METABOLIC PANEL  POC URINE PREG, ED  ABO/RH    ____________________________________________  EKG   ____________________________________________  RADIOLOGY  ED MD interpretation: Ultrasound read by radiology reviewed by me shows per radiology small amount of free fluid cannot see if pregnancy cannot rule out an early pregnancy or an ectopic or miscarriage.  Official radiology report(s): No results found.  ____________________________________________   PROCEDURES  Procedure(s) performed (including Critical Care):  Procedures   ____________________________________________   INITIAL IMPRESSION / ASSESSMENT AND PLAN / ED COURSE  ----------------------------------------- 5:33 PM on 07/12/2018 -----------------------------------------  Discussed patient with Dr. Gaynelle Arabian Dr. Gaynelle Arabian wants to come down to see the patient this is great patient is getting ready. ----------------------------------------- 6:18 PM on 07/12/2018 -----------------------------------------  Dr. Gaynelle Arabian is seen the patient.  She will follow-up with the patient on Wednesday in the office.  Patient will return here for worse bleeding pain or cramping.  Also she is lightheaded.  Dr. Gaynelle Arabian thinks patient may have had a pregnancy in the tubes and miscarried it.  Have instructed the patient to return at once if she has worse pain bleeding or feels weak or lightheaded.  She will follow-up on Wednesday as I noted. On discharge patient vitals are stable.     ____________________________________________   FINAL CLINICAL IMPRESSION(S) / ED DIAGNOSES  Final diagnoses:  Vaginal bleeding affecting early pregnancy  Abdominal cramping complicating pregnancy     ED Discharge Orders    None       Note:  This document was prepared using Dragon voice recognition software and may include unintentional dictation errors.    Arnaldo Natal, MD 07/12/18 1821    Arnaldo Natal, MD 07/12/18 [phone]

## 2018-07-12 NOTE — ED Notes (Signed)
LMP 06/30/2018  "super light" No period February Regular period 05/05/2018

## 2018-07-12 NOTE — Discharge Instructions (Signed)
Please return here for worse pain fever vomiting heavier bleeding or if you get lightheaded or weak.  Please be sure to call [facility] and schedule an appointment on Wednesday.  Let them know you were seen in the ER and the Dr. Gaynelle Arabian saw you in the ER and wants you seen on Wednesday.  At this point it is impossible to tell if you are having a very early pregnancy that will continue to term or if you had a pregnancy in the tubes and have already miscarried at and are still having some bleeding or if you were having threatened miscarriage.  I have included the instructions for threatened miscarriage.  Please be sure to return at once if you get worse as I noted above.

## 2018-07-12 NOTE — ED Notes (Signed)
Called lab spoke to [facility] about add on for CBC and MetC. Peggy stated she would add on.

## 2018-07-14 ENCOUNTER — Encounter: Payer: Self-pay | Admitting: Obstetrics & Gynecology

## 2018-07-14 ENCOUNTER — Other Ambulatory Visit: Payer: Self-pay | Admitting: Obstetrics and Gynecology

## 2018-07-14 ENCOUNTER — Other Ambulatory Visit: Payer: Self-pay

## 2018-07-14 ENCOUNTER — Other Ambulatory Visit: Payer: Medicaid Other

## 2018-07-14 DIAGNOSIS — O3680X0 Pregnancy with inconclusive fetal viability, not applicable or unspecified: Secondary | ICD-10-CM

## 2018-07-15 ENCOUNTER — Encounter: Payer: Self-pay | Admitting: Obstetrics and Gynecology

## 2018-07-15 ENCOUNTER — Other Ambulatory Visit: Payer: Self-pay

## 2018-07-15 ENCOUNTER — Ambulatory Visit (INDEPENDENT_AMBULATORY_CARE_PROVIDER_SITE_OTHER): Payer: Medicaid Other | Admitting: Obstetrics and Gynecology

## 2018-07-15 VITALS — BP 118/70 | HR 85 | Ht 68.5 in | Wt 212.0 lb

## 2018-07-15 DIAGNOSIS — O3680X0 Pregnancy with inconclusive fetal viability, not applicable or unspecified: Secondary | ICD-10-CM

## 2018-07-15 LAB — BETA HCG QUANT (REF LAB): hCG Quant: 823 m[IU]/mL

## 2018-07-15 NOTE — Progress Notes (Signed)
Patient ID: Heather Jacobs, female   DOB: February 12, 1996, 23 y.o.   MRN: [id number]  Reason for Consult: Follow-up (ER follow up, any pressure on the abdomin causes pain, vaginal bleeding, nausea and fatigue )   Referred by No ref. provider found  Subjective:     HPI:  Heather Jacobs is a 23 y.o. female . She presents today for follow up from a recent ER visit. Beta hcg values have been increasing normally however she has continue vaginal bleeding and abdominal pain. She denies lightheadedness, dizziness or fainting. She denies nausea or vomiting. This is a desired pregnancy and she would like to avoid intervention until location or the pregnancy is determined. She is staying with family and has easy access/transport to the emergency room if needed.   Unknown LMP, she has been breastfeeding and her periods have been irregular.   History reviewed. No pertinent past medical history. History reviewed. No pertinent family history. Past Surgical History:  Procedure Laterality Date  . ARTHROSCOPIC REPAIR ACL      Short Social History:  Social History   Tobacco Use  . Smoking status: Never Smoker  . Smokeless tobacco: Never Used  Substance Use Topics  . Alcohol use: Never    Frequency: Never    Allergies  Allergen Reactions  . Chlorhexidine Other (See Comments)    States she's allergic    Current Outpatient Medications  Medication Sig Dispense Refill  . prenatal vitamin w/FE, FA (PRENATAL 1 + 1) 27-1 MG TABS tablet Take 1 tablet by mouth daily at 12 noon.     No current facility-administered medications for this visit.     Review of Systems  Constitutional: Negative for chills, fatigue, fever and unexpected weight change.  HENT: Negative for trouble swallowing.  Eyes: Negative for loss of vision.  Respiratory: Negative for cough, shortness of breath and wheezing.  Cardiovascular: Negative for chest pain, leg swelling, palpitations and syncope.  GI: Negative for abdominal  pain, blood in stool, diarrhea, nausea and vomiting.  GU: Negative for difficulty urinating, dysuria, frequency and hematuria.  Musculoskeletal: Negative for back pain, leg pain and joint pain.  Skin: Negative for rash.  Neurological: Negative for dizziness, headaches, light-headedness, numbness and seizures.  Psychiatric: Negative for behavioral problem, confusion, depressed mood and sleep disturbance.        Objective:  Objective   Vitals:   07/15/18 1436  BP: 118/70  Pulse: 85  Weight: 212 lb (96.2 kg)  Height: 5' 8.5" (1.74 m)   Body mass index is 31.77 kg/m.  Physical Exam Vitals signs and nursing note reviewed.  Constitutional:      Appearance: She is well-developed.  HENT:     Head: Normocephalic and atraumatic.  Eyes:     Pupils: Pupils are equal, round, and reactive to light.  Cardiovascular:     Rate and Rhythm: Normal rate and regular rhythm.  Pulmonary:     Effort: Pulmonary effort is normal. No respiratory distress.  Skin:    General: Skin is warm and dry.  Neurological:     Mental Status: She is alert and oriented to person, place, and time.  Psychiatric:        Behavior: Behavior normal.        Thought Content: Thought content normal.        Judgment: Judgment normal.        Assessment/Plan:     23 yo G2P1001  1. Pregnancy of unknown location. Unknown LMP.  This is a desired  pregnancy. She has been offered a diagnostic laparoscopy to help with determination of this being an ectopic pregnancy because of symptoms and [place] suggesting free fluid in cul de sac. She would like to avoid surgery if possible. So far, beta hcg has increased normally. She will continue to receive beta hcg levels every 48 hours. Will repeat US in 1 week to see if pregnancy location can be determined.   She was counseled in detail abut the risks of a ruptured ectopic pregnancy such as abdominal bleeding, death, hemorrhage, etc. She will practice ectopic pregnancy precautions by not  having intercourse, remaining close to medical treatment, not traveling, and not being alone at home.   More than 15 minutes were spent face to face with the patient in the room with more than 50% of the time spent providing counseling and discussing the plan of management.  Follow up in 1 week.  Follow up tomorrow for labs.   Adelene Idler MD [facility], [facility] 07/15/2018 3:10 PM

## 2018-07-16 ENCOUNTER — Other Ambulatory Visit: Payer: Self-pay

## 2018-07-16 ENCOUNTER — Other Ambulatory Visit: Payer: Medicaid Other

## 2018-07-16 DIAGNOSIS — O3680X0 Pregnancy with inconclusive fetal viability, not applicable or unspecified: Secondary | ICD-10-CM

## 2018-07-17 LAB — BETA HCG QUANT (REF LAB): hCG Quant: 1573 m[IU]/mL

## 2018-07-18 ENCOUNTER — Emergency Department
Admission: EM | Admit: 2018-07-18 | Discharge: 2018-07-18 | Disposition: A | Discharge status: Home / Self Care | Payer: Medicaid Other | Attending: Obstetrics & Gynecology | Admitting: Obstetrics & Gynecology

## 2018-07-18 ENCOUNTER — Encounter: Payer: Self-pay | Admitting: Emergency Medicine

## 2018-07-18 ENCOUNTER — Emergency Department: Payer: Medicaid Other | Admitting: Anesthesiology

## 2018-07-18 ENCOUNTER — Other Ambulatory Visit: Payer: Self-pay

## 2018-07-18 ENCOUNTER — Emergency Department: Payer: Medicaid Other

## 2018-07-18 ENCOUNTER — Encounter
Admission: EM | Disposition: A | Discharge status: Home / Self Care | Payer: Self-pay | Source: Home / Self Care | Attending: Emergency Medicine

## 2018-07-18 DIAGNOSIS — N838 Other noninflammatory disorders of ovary, fallopian tube and broad ligament: Secondary | ICD-10-CM | POA: Insufficient documentation

## 2018-07-18 DIAGNOSIS — O00101 Right tubal pregnancy without intrauterine pregnancy: Secondary | ICD-10-CM

## 2018-07-18 DIAGNOSIS — K661 Hemoperitoneum: Secondary | ICD-10-CM

## 2018-07-18 DIAGNOSIS — N83202 Unspecified ovarian cyst, left side: Secondary | ICD-10-CM | POA: Insufficient documentation

## 2018-07-18 DIAGNOSIS — R109 Unspecified abdominal pain: Secondary | ICD-10-CM | POA: Diagnosis present

## 2018-07-18 HISTORY — PX: LAPAROSCOPY: SHX197

## 2018-07-18 HISTORY — DX: Hemoperitoneum: K66.1

## 2018-07-18 HISTORY — DX: Right tubal pregnancy without intrauterine pregnancy: O00.101

## 2018-07-18 LAB — URINALYSIS, ROUTINE W REFLEX MICROSCOPIC
Bacteria, UA: NONE SEEN
Bilirubin Urine: NEGATIVE
GLUCOSE, UA: NEGATIVE mg/dL
Ketones, ur: NEGATIVE mg/dL
Leukocytes,Ua: NEGATIVE
Nitrite: NEGATIVE
PH: 7 (ref 5.0–8.0)
Protein, ur: NEGATIVE mg/dL
Specific Gravity, Urine: 1.021 (ref 1.005–1.030)

## 2018-07-18 LAB — HCG, QUANTITATIVE, PREGNANCY: hCG, Beta Chain, Quant, S: 3353 m[IU]/mL — ABNORMAL HIGH (ref <5)

## 2018-07-18 SURGERY — LAPAROSCOPY, DIAGNOSTIC
Anesthesia: General | Site: Abdomen

## 2018-07-18 MED ORDER — FENTANYL CITRATE (PF) 100 MCG/2ML IJ SOLN: 25.0000 ug | INTRAMUSCULAR | Status: DC | PRN

## 2018-07-18 MED ORDER — LIDOCAINE HCL (CARDIAC) PF 100 MG/5ML IV SOSY
PREFILLED_SYRINGE | INTRAVENOUS | Status: DC | PRN
  Administered 2018-07-18: 100 mg via INTRAVENOUS

## 2018-07-18 MED ORDER — MIDAZOLAM HCL 2 MG/2ML IJ SOLN
INTRAMUSCULAR | Status: AC
  Filled 2018-07-18: qty 2

## 2018-07-18 MED ORDER — DEXMEDETOMIDINE HCL 200 MCG/2ML IV SOLN
INTRAVENOUS | Status: DC | PRN
  Administered 2018-07-18: 8 ug via INTRAVENOUS
  Administered 2018-07-18: 12 ug via INTRAVENOUS

## 2018-07-18 MED ORDER — SUCCINYLCHOLINE CHLORIDE 20 MG/ML IJ SOLN
INTRAMUSCULAR | Status: DC | PRN
  Administered 2018-07-18: 80 mg via INTRAVENOUS

## 2018-07-18 MED ORDER — FENTANYL CITRATE (PF) 100 MCG/2ML IJ SOLN
INTRAMUSCULAR | Status: DC | PRN
  Administered 2018-07-18: 25 ug via INTRAVENOUS
  Administered 2018-07-18: 150 ug via INTRAVENOUS
  Administered 2018-07-18: 25 ug via INTRAVENOUS
  Administered 2018-07-18: 50 ug via INTRAVENOUS

## 2018-07-18 MED ORDER — MORPHINE SULFATE (PF) 2 MG/ML IV SOLN
2.0000 mg | Freq: Once | INTRAVENOUS | Status: AC
  Administered 2018-07-18: 2 mg via INTRAVENOUS
  Filled 2018-07-18: qty 1

## 2018-07-18 MED ORDER — KETOROLAC TROMETHAMINE 30 MG/ML IJ SOLN
30.0000 mg | Freq: Four times a day (QID) | INTRAMUSCULAR | Status: DC
  Filled 2018-07-18: qty 1

## 2018-07-18 MED ORDER — DEXMEDETOMIDINE HCL IN NACL 200 MCG/50ML IV SOLN
INTRAVENOUS | Status: AC
  Filled 2018-07-18: qty 50

## 2018-07-18 MED ORDER — ONDANSETRON HCL 4 MG/2ML IJ SOLN
INTRAMUSCULAR | Status: AC
  Filled 2018-07-18: qty 2

## 2018-07-18 MED ORDER — MIDAZOLAM HCL 2 MG/2ML IJ SOLN
INTRAMUSCULAR | Status: DC | PRN
  Administered 2018-07-18: 2 mg via INTRAVENOUS

## 2018-07-18 MED ORDER — LACTATED RINGERS IV SOLN: INTRAVENOUS | Status: DC

## 2018-07-18 MED ORDER — ONDANSETRON HCL 4 MG/2ML IJ SOLN: 4.0000 mg | Freq: Once | INTRAMUSCULAR | Status: DC | PRN

## 2018-07-18 MED ORDER — FENTANYL CITRATE (PF) 250 MCG/5ML IJ SOLN
INTRAMUSCULAR | Status: AC
  Filled 2018-07-18: qty 5

## 2018-07-18 MED ORDER — PROPOFOL 10 MG/ML IV BOLUS
INTRAVENOUS | Status: AC
  Filled 2018-07-18: qty 20

## 2018-07-18 MED ORDER — PROPOFOL 10 MG/ML IV BOLUS
INTRAVENOUS | Status: DC | PRN
  Administered 2018-07-18: 150 mg via INTRAVENOUS
  Administered 2018-07-18: 50 mg via INTRAVENOUS

## 2018-07-18 MED ORDER — ONDANSETRON HCL 4 MG/2ML IJ SOLN
INTRAMUSCULAR | Status: DC | PRN
  Administered 2018-07-18: 4 mg via INTRAVENOUS

## 2018-07-18 MED ORDER — SUGAMMADEX SODIUM 200 MG/2ML IV SOLN
INTRAVENOUS | Status: DC | PRN
  Administered 2018-07-18: 300 mg via INTRAVENOUS

## 2018-07-18 MED ORDER — SUGAMMADEX SODIUM 200 MG/2ML IV SOLN
INTRAVENOUS | Status: AC
  Filled 2018-07-18: qty 2

## 2018-07-18 MED ORDER — OXYCODONE-ACETAMINOPHEN 5-325 MG PO TABS: 1.0000 | ORAL_TABLET | ORAL | Status: DC | PRN

## 2018-07-18 MED ORDER — BUPIVACAINE HCL (PF) 0.5 % IJ SOLN
INTRAMUSCULAR | Status: DC | PRN
  Administered 2018-07-18: 12 mL

## 2018-07-18 MED ORDER — DEXAMETHASONE SODIUM PHOSPHATE 10 MG/ML IJ SOLN
INTRAMUSCULAR | Status: DC | PRN
  Administered 2018-07-18: 10 mg via INTRAVENOUS

## 2018-07-18 MED ORDER — BUPIVACAINE HCL (PF) 0.5 % IJ SOLN
INTRAMUSCULAR | Status: AC
  Filled 2018-07-18: qty 30

## 2018-07-18 MED ORDER — LACTATED RINGERS IV SOLN
INTRAVENOUS | Status: DC | PRN
  Administered 2018-07-18 (×2): via INTRAVENOUS

## 2018-07-18 MED ORDER — ROCURONIUM BROMIDE 100 MG/10ML IV SOLN
INTRAVENOUS | Status: DC | PRN
  Administered 2018-07-18: 10 mg via INTRAVENOUS
  Administered 2018-07-18: 30 mg via INTRAVENOUS

## 2018-07-18 MED ORDER — SUCCINYLCHOLINE CHLORIDE 20 MG/ML IJ SOLN
INTRAMUSCULAR | Status: AC
  Filled 2018-07-18: qty 1

## 2018-07-18 MED ORDER — LACTATED RINGERS IV SOLN
INTRAVENOUS | Status: AC | PRN
  Administered 2018-07-18: 300 mL via INTRAUTERINE

## 2018-07-18 MED ORDER — ONDANSETRON HCL 4 MG/2ML IJ SOLN
4.0000 mg | Freq: Once | INTRAMUSCULAR | Status: AC
  Administered 2018-07-18: 4 mg via INTRAVENOUS
  Filled 2018-07-18: qty 2

## 2018-07-18 MED ORDER — ACETAMINOPHEN 325 MG PO TABS: 650.0000 mg | ORAL_TABLET | ORAL | Status: DC | PRN

## 2018-07-18 MED ORDER — ACETAMINOPHEN 650 MG RE SUPP
650.0000 mg | RECTAL | Status: DC | PRN
  Filled 2018-07-18: qty 1

## 2018-07-18 MED ORDER — LIDOCAINE HCL (PF) 2 % IJ SOLN
INTRAMUSCULAR | Status: AC
  Filled 2018-07-18: qty 10

## 2018-07-18 MED ORDER — DEXAMETHASONE SODIUM PHOSPHATE 10 MG/ML IJ SOLN
INTRAMUSCULAR | Status: AC
  Filled 2018-07-18: qty 1

## 2018-07-18 MED ORDER — MORPHINE SULFATE (PF) 4 MG/ML IV SOLN: 1.0000 mg | INTRAVENOUS | Status: DC | PRN

## 2018-07-18 MED ORDER — OXYCODONE-ACETAMINOPHEN 5-325 MG PO TABS: 1.0000 | ORAL_TABLET | ORAL | 0 refills | Status: DC | PRN

## 2018-07-18 MED ORDER — ROCURONIUM BROMIDE 50 MG/5ML IV SOLN
INTRAVENOUS | Status: AC
  Filled 2018-07-18: qty 1

## 2018-07-18 SURGICAL SUPPLY — 39 items
BLADE SURG SZ11 CARB STEEL (BLADE) ×3 IMPLANT
CANISTER SUCT 1200ML W/VALVE (MISCELLANEOUS) ×3 IMPLANT
CATH ROBINSON RED A/P 16FR (CATHETERS) ×3 IMPLANT
CHLORAPREP W/TINT 26 (MISCELLANEOUS) IMPLANT
COVER WAND RF STERILE (DRAPES) IMPLANT
DERMABOND ADVANCED (GAUZE/BANDAGES/DRESSINGS) ×2
DERMABOND ADVANCED .7 DNX12 (GAUZE/BANDAGES/DRESSINGS) ×1 IMPLANT
DRSG TELFA 4X3 1S NADH ST (GAUZE/BANDAGES/DRESSINGS) IMPLANT
GLOVE BIO SURGEON STRL SZ8 (GLOVE) ×6 IMPLANT
GLOVE INDICATOR 8.0 STRL GRN (GLOVE) ×6 IMPLANT
GOWN STRL REUS W/ TWL LRG LVL3 (GOWN DISPOSABLE) ×1 IMPLANT
GOWN STRL REUS W/ TWL XL LVL3 (GOWN DISPOSABLE) ×1 IMPLANT
GOWN STRL REUS W/TWL LRG LVL3 (GOWN DISPOSABLE) ×2
GOWN STRL REUS W/TWL XL LVL3 (GOWN DISPOSABLE) ×2
GRASPER SUT TROCAR 14GX15 (MISCELLANEOUS) ×3 IMPLANT
IRRIGATION STRYKERFLOW (MISCELLANEOUS) ×1 IMPLANT
IRRIGATOR STRYKERFLOW (MISCELLANEOUS) ×3
IV LACTATED RINGERS 1000ML (IV SOLUTION) ×3 IMPLANT
KIT PINK PAD W/HEAD ARE REST (MISCELLANEOUS) ×3
KIT PINK PAD W/HEAD ARM REST (MISCELLANEOUS) ×1 IMPLANT
LABEL OR SOLS (LABEL) IMPLANT
NEEDLE VERESS 14GA 120MM (NEEDLE) ×3 IMPLANT
NS IRRIG 500ML POUR BTL (IV SOLUTION) ×3 IMPLANT
PACK GYN LAPAROSCOPIC (MISCELLANEOUS) ×3 IMPLANT
PAD PREP 24X41 OB/GYN DISP (PERSONAL CARE ITEMS) ×3 IMPLANT
POUCH SPECIMEN RETRIEVAL 10MM (ENDOMECHANICALS) IMPLANT
SCISSORS METZENBAUM CVD 33 (INSTRUMENTS) ×3 IMPLANT
SET TUBE SMOKE EVAC HIGH FLOW (TUBING) ×3 IMPLANT
SHEARS HARMONIC ACE PLUS 36CM (ENDOMECHANICALS) ×3 IMPLANT
SLEEVE ENDOPATH XCEL 5M (ENDOMECHANICALS) IMPLANT
SPONGE GAUZE 2X2 8PLY STER LF (GAUZE/BANDAGES/DRESSINGS)
SPONGE GAUZE 2X2 8PLY STRL LF (GAUZE/BANDAGES/DRESSINGS) IMPLANT
STRAP SAFETY 5IN WIDE (MISCELLANEOUS) ×3 IMPLANT
SUT VIC AB 2-0 UR6 27 (SUTURE) IMPLANT
SUT VIC AB 4-0 PS2 18 (SUTURE) IMPLANT
SUT VICRYL 0 AB UR-6 (SUTURE) ×3 IMPLANT
SYR 10ML LL (SYRINGE) ×3 IMPLANT
TROCAR ENDO BLADELESS 11MM (ENDOMECHANICALS) ×3 IMPLANT
TROCAR XCEL NON-BLD 5MMX100MML (ENDOMECHANICALS) ×3 IMPLANT

## 2018-07-18 NOTE — ED Notes (Signed)
Pelvic card is at bedside.

## 2018-07-18 NOTE — Anesthesia Preprocedure Evaluation (Signed)
Anesthesia Evaluation  Patient identified by MRN, date of birth, ID band Patient awake    Reviewed: Allergy & Precautions, NPO status , Patient's Chart, lab work & pertinent test results, reviewed documented beta blocker date and time   Airway Mallampati: II  TM Distance: >3 FB     Dental  (+) Chipped   Pulmonary           Cardiovascular      Neuro/Psych    GI/Hepatic   Endo/Other    Renal/GU      Musculoskeletal   Abdominal   Peds  Hematology   Anesthesia Other Findings   Reproductive/Obstetrics                             Anesthesia Physical Anesthesia Plan  ASA: II  Anesthesia Plan: General   Post-op Pain Management:    Induction: Intravenous  PONV Risk Score and Plan:   Airway Management Planned: Oral ETT  Additional Equipment:   Intra-op Plan:   Post-operative Plan:   Informed Consent: I have reviewed the patients History and Physical, chart, labs and discussed the procedure including the risks, benefits and alternatives for the proposed anesthesia with the patient or authorized representative who has indicated his/her understanding and acceptance.     Plan Discussed with: CRNA  Anesthesia Plan Comments:         Anesthesia Quick Evaluation  

## 2018-07-18 NOTE — Anesthesia Post-op Follow-up Note (Signed)
Anesthesia QCDR form completed.        

## 2018-07-18 NOTE — Anesthesia Postprocedure Evaluation (Signed)
Anesthesia Post Note  Patient: [name], academic  Procedure(s) Performed: LAPAROSCOPY DIAGNOSTIC,right salpingostomy (N/A Abdomen)  Patient location during evaluation: PACU Anesthesia Type: General Level of consciousness: awake and alert Pain management: pain level controlled Vital Signs Assessment: post-procedure vital signs reviewed and stable Respiratory status: spontaneous breathing, nonlabored ventilation, respiratory function stable and patient connected to nasal cannula oxygen Cardiovascular status: blood pressure returned to baseline and stable Postop Assessment: no apparent nausea or vomiting Anesthetic complications: no     Last Vitals:  Vitals:   07/18/18 0740 07/18/18 0741  BP: (!) 121/54 121/71  Pulse: 81 67  Resp: 18 17  Temp:    SpO2: 100% 100%    Last Pain:  Vitals:   07/18/18 0741  TempSrc:   PainSc: 0-No pain                 Astraea Gaughran S

## 2018-07-18 NOTE — Discharge Instructions (Signed)
Ectopic Pregnancy ° °An ectopic pregnancy is when the fertilized egg attaches (implants) outside the uterus. Most ectopic pregnancies occur in one of the tubes where eggs travel from the ovary to the uterus (fallopian tubes), but the implanting can occur in other locations. In rare cases, ectopic pregnancies occur on the ovary, intestine, pelvis, abdomen, or cervix. In an ectopic pregnancy, the fertilized egg does not have the ability to develop into a normal, healthy baby. °A ruptured ectopic pregnancy is one in which tearing or bursting of a fallopian tube causes internal bleeding. Often, there is intense lower abdominal pain, and vaginal bleeding sometimes occurs. Having an ectopic pregnancy can be life-threatening. If this dangerous condition is not treated, it can lead to blood loss, shock, or even death. °What are the causes? °The most common cause of this condition is damage to one of the fallopian tubes. A fallopian tube may be narrowed or blocked, and that keeps the fertilized egg from reaching the uterus. °What increases the risk? °This condition is more likely to develop in women of childbearing age who have different levels of risk. The levels of risk can be divided into three categories. °High risk °· You have gone through infertility treatment. °· You have had an ectopic pregnancy before. °· You have had surgery on the fallopian tubes, or another surgical procedure, such as an abortion. °· You have had surgery to have the fallopian tubes tied (tubal ligation). °· You have problems or diseases of the fallopian tubes. °· You have been exposed to diethylstilbestrol (DES). This medicine was used until 1971, and it had effects on babies whose mothers took the medicine. °· You become pregnant while using an IUD (intrauterine device) for birth control. °Moderate risk °· You have a history of infertility. °· You have had an STI (sexually transmitted infection). °· You have a history of pelvic inflammatory  disease (PID). °· You have scarring from endometriosis. °· You have multiple sexual partners. °· You smoke. °Low risk °· You have had pelvic surgery. °· You use vaginal douches. °· You became sexually active before age 18. °What are the signs or symptoms? °Common symptoms of this condition include normal pregnancy symptoms, such as missing a period, nausea, tiredness, abdominal pain, breast tenderness, and bleeding. However, ectopic pregnancy will have additional symptoms, such as: °· Pain with intercourse. °· Irregular vaginal bleeding or spotting. °· Cramping or pain on one side or in the lower abdomen. °· Fast heartbeat, low blood pressure, and sweating. °· Passing out while having a bowel movement. °Symptoms of a ruptured ectopic pregnancy and internal bleeding may include: °· Sudden, severe pain in the abdomen and pelvis. °· Dizziness, weakness, light-headedness, or fainting. °· Pain in the shoulder or neck area. °How is this diagnosed? °This condition is diagnosed by: °· A pelvic exam to locate pain or a mass in the abdomen. °· A pregnancy test. This blood test checks for the presence as well as the specific level of pregnancy hormone in the bloodstream. °· Ultrasound. This is performed if a pregnancy test is positive. In this test, a probe is inserted into the vagina. The probe will detect a fetus, possibly in a location other than the uterus. °· Taking a sample of uterus tissue (dilation and curettage, or D&C). °· Surgery to perform a visual exam of the inside of the abdomen using a thin, lighted tube that has a tiny camera on the end (laparoscope). °· Culdocentesis. This procedure involves inserting a needle at the top of   the vagina, behind the uterus. If blood is present in this area, it may indicate that a fallopian tube is torn. How is this treated? This condition is treated with medicine or surgery. Medicine  An injection of a medicine (methotrexate) may be given to cause the pregnancy tissue to be  absorbed. This medicine may save your fallopian tube. It may be given if: ? The diagnosis is made early, with no signs of active bleeding. ? The fallopian tube has not ruptured. ? You are considered to be a good candidate for the medicine. Usually, pregnancy hormone blood levels are checked after methotrexate treatment. This is to be sure that the medicine is effective. It may take 4-6 weeks for the pregnancy to be absorbed. Most pregnancies will be absorbed by 3 weeks. Surgery  A laparoscope may be used to remove the pregnancy tissue.  If severe internal bleeding occurs, a larger cut (incision) may be made in the lower abdomen (laparotomy) to remove the fetus and placenta. This is done to stop the bleeding.  Part or all of the fallopian tube may be removed (salpingectomy) along with the fetus and placenta. The fallopian tube may also be repaired during the surgery.  In very rare circumstances, removal of the uterus (hysterectomy) may be required.  After surgery, pregnancy hormone testing may be done to be sure that there is no pregnancy tissue left. Whether your treatment is medicine or surgery, you may receive a Rho (D) immune globulin shot to prevent problems with any future pregnancy. This shot may be given if:  You are Rh-negative and the baby's father is Rh-positive.  You are Rh-negative and you do not know the Rh type of the baby's father. Follow these instructions at home:  Rest and limit your activity after the procedure for as long as told by your health care provider.  Until your health care provider says that it is safe: ? Do not lift anything that is heavier than 10 lb (4.5 kg), or the limit that your health care provider tells you. ? Avoid physical exercise and any movement that requires effort (is strenuous).  To help prevent constipation: ? Eat a healthy diet that includes fruits, vegetables, and whole grains. ? Drink 6-8 glasses of water per day. Get help right away  if:  You develop worsening pain that is not relieved by medicine.  You have: ? A fever or chills. ? Vaginal bleeding. ? Redness and swelling at the incision site. ? Nausea and vomiting.  You feel dizzy or weak.  You feel light-headed or you faint. This information is not intended to replace advice given to you by your health care provider. Make sure you discuss any questions you have with your health care provider. Document Released: 05/22/2004 Document Revised: 12/12/2015 Document Reviewed: 11/14/2015 Elsevier Interactive Patient Education  2019 [employer]   Operative Laparoscopy, Care After This sheet gives you information about how to care for yourself after your procedure. Your health care provider may also give you more specific instructions. If you have problems or questions, contact your health care provider. What can I expect after the procedure? After the procedure, it is common to have:  Mild discomfort in the abdomen.  Sore throat. Women who have laparoscopy with pelvic examination may have mild cramping and fluid coming from the vagina for a few days after the procedure. Follow these instructions at home: Medicines  Take over-the-counter and prescription medicines only as told by your health care provider.  If you  were prescribed an antibiotic medicine, take it as told by your health care provider. Do not stop taking the antibiotic even if you start to feel better. Driving  Do not drive for 24 hours if you were given a medicine to help you relax (sedative) during your procedure.  Do not drive or use heavy machinery while taking prescription pain medicine. Bathing  Do not take baths, swim, or use a hot tub until your health care provider approves. You may take showers. Incision care   Follow instructions from your health care provider about how to take care of your incisions. Make sure you: ? Wash your hands with soap and water before you change your  bandage (dressing). If soap and water are not available, use hand sanitizer. ? Change your dressing as told by your health care provider. ? Leave stitches (sutures), skin glue, or adhesive strips in place. These skin closures may need to stay in place for 2 weeks or longer. If adhesive strip edges start to loosen and curl up, you may trim the loose edges. Do not remove adhesive strips completely unless your health care provider tells you to do that.  Check your incision areas every day for signs of infection. Check for: ? Redness, swelling, or pain. ? Fluid or blood. ? Warmth. ? Pus or a bad smell. Activity  Return to your normal activities as told by your health care provider. Ask your health care provider what activities are safe for you.  Do not lift anything that is heavier than 10 lb (4.5 kg), or the limit that you are told, until your health care provider says that it is safe. General instructions  To prevent or treat constipation while you are taking prescription pain medicine, your health care provider may recommend that you: ? Drink enough fluid to keep your urine pale yellow. ? Take over-the-counter or prescription medicines. ? Eat foods that are high in fiber, such as fresh fruits and vegetables, whole grains, and beans. ? Limit foods that are high in fat and processed sugars, such as fried and sweet foods.  Do not use any products that contain nicotine or tobacco, such as cigarettes and e-cigarettes. If you need help quitting, ask your health care provider.  Keep all follow-up visits as told by your health care provider. This is important. Contact a health care provider if:  You develop shoulder pain.  You feel lightheaded or faint.  You are unable to pass gas or have a bowel movement.  You feel nauseous or you vomit.  You develop a rash.  You have redness, swelling, or pain around any incision.  You have fluid or blood coming from any incision.  Any incision  feels warm to the touch.  You have pus or a bad smell coming from any incision.  You have a fever or chills. Get help right away if:  You have severe pain.  You have vomiting that does not go away.  You have heavy bleeding from the vagina.  Any incision opens.  You have trouble breathing.  You have chest pain. Summary  After the procedure, it is common to have mild discomfort in the abdomen and a sore throat.  Check your incision areas every day for signs of infection.  Return to your normal activities as told by your health care provider. Ask your health care provider what activities are safe for you. This information is not intended to replace advice given to you by your health care provider. Make  sure you discuss any questions you have with your health care provider. Document Released: 03/26/2015 Document Revised: 10/08/2016 Document Reviewed: 10/08/2016 Elsevier Interactive Patient Education  [employer].

## 2018-07-18 NOTE — ED Notes (Signed)
Pt in ultrasound

## 2018-07-18 NOTE — Anesthesia Procedure Notes (Signed)
Procedure Name: Intubation Date/Time: 07/18/2018 5:42 AM Performed by: Clinton Sawyer, CRNA Pre-anesthesia Checklist: Patient identified, Emergency Drugs available, Suction available, Patient being monitored and Timeout performed Patient Re-evaluated:Patient Re-evaluated prior to induction Oxygen Delivery Method: Circle system utilized Preoxygenation: Pre-oxygenation with 100% oxygen Induction Type: IV induction and Rapid sequence Laryngoscope Size: Mac and 4 Grade View: Grade I Tube type: Oral Tube size: 7.0 mm Number of attempts: 1 Airway Equipment and Method: Stylet Placement Confirmation: ETT inserted through vocal cords under direct vision,  positive ETCO2 and breath sounds checked- equal and bilateral Secured at: 22 cm Tube secured with: Tape Dental Injury: Teeth and Oropharynx as per pre-operative assessment

## 2018-07-18 NOTE — ED Triage Notes (Addendum)
Pt seen here on 07/12/18 following positive pregnancy test at home; pt had [place] and was told to return here for any increase in abd pain; pt presents tonight with increased abd cramping; pt says she is "still bleeding"

## 2018-07-18 NOTE — ED Notes (Signed)
Report given to [place] from [place]

## 2018-07-18 NOTE — Transfer of Care (Signed)
Immediate Anesthesia Transfer of Care Note  Patient: Heather Jacobs  Procedure(s) Performed: LAPAROSCOPY DIAGNOSTIC,right salpingostomy (N/A Abdomen)  Patient Location: PACU  Anesthesia Type:General  Level of Consciousness: sedated  Airway & Oxygen Therapy: Patient Spontanous Breathing and Patient connected to face mask oxygen  Post-op Assessment: Report given to RN and Post -op Vital signs reviewed and stable  Post vital signs: Reviewed and stable  Last Vitals:  Vitals Value Taken Time  BP    Temp    Pulse    Resp    SpO2      Last Pain:  Vitals:   07/18/18 0500  TempSrc:   PainSc: 3          Complications: No apparent anesthesia complications

## 2018-07-18 NOTE — H&P (Signed)
Obstetrics & Gynecology History and Physical Note  Date of Consultation: 07/18/2018   [facility] Provider[facility]  [facility] Primary Care Provider: Patient, No Pcp Per  Reason for Consultation: Right lower quadrant pain  History of Present Illness: Ms. Heather Jacobs is a [name] (Patient's last menstrual period was 05/05/2018 (exact date).), with the above CC. Pt has been followed for pain and bleeding since 07/12/2018.  She has had a rise in beta hCG levels from 515 to 823 to 1573 to 3353 today; ultrasound done 3/16 and again today, see below.  She awoke this am (0200) in acute pain expecially in the RLQ but that also radiates to the LLQ and is associated with nausea this am as well as bright red vaginal bleeding.  She has no modifiers, requiring narcotics in ER w some help.  She has prior NSVD 04/2017.  ROS: A review of systems was performed and was complete and comprehensive, except as stated in the above HPI.  OBGYN History: As per HPI. OB History    Gravida  2   Para  1   Term  1   Preterm      AB      Living  1     SAB      TAB      Ectopic      Multiple      Live Births  1            Past Medical History: History reviewed. No pertinent past medical history.  Past Surgical History: Past Surgical History:  Procedure Laterality Date  . ARTHROSCOPIC REPAIR ACL      Family History:  No family history on file. She denies any female cancers, bleeding or blood clotting disorders.   Social History:  Social History   Socioeconomic History  . Marital status: Married    Spouse name: Not on file  . Number of children: Not on file  . Years of education: Not on file  . Highest education level: Not on file  Occupational History  . Not on file  Social Needs  . Financial resource strain: Not on file  . Food insecurity:    Worry: Not on file    Inability: Not on file  . Transportation needs:    Medical: Not on file     Non-medical: Not on file  Tobacco Use  . Smoking status: Never Smoker  . Smokeless tobacco: Never Used  Substance and Sexual Activity  . Alcohol use: Never    Frequency: Never  . Drug use: Never  . Sexual activity: Yes    Birth control/protection: None  Lifestyle  . Physical activity:    Days per week: Not on file    Minutes per session: Not on file  . Stress: Not on file  Relationships  . Social connections:    Talks on phone: Not on file    Gets together: Not on file    Attends religious service: Not on file    Active member of club or organization: Not on file    Attends meetings of clubs or organizations: Not on file    Relationship status: Not on file  . Intimate partner violence:    Fear of current or ex partner: Not on file    Emotionally abused: Not on file    Physically abused: Not on file    Forced sexual activity: Not on file  Other Topics Concern  . Not on file  Social History Narrative  . Not on file    Allergy: Allergies  Allergen Reactions  . Chlorhexidine Other (See Comments)    States she's allergic    Current Outpatient Medications: (Not in a hospital admission)   Hospital Medications: No current facility-administered medications for this encounter.    Current Outpatient Medications  Medication Sig Dispense Refill  . prenatal vitamin w/FE, FA (PRENATAL 1 + 1) 27-1 MG TABS tablet Take 1 tablet by mouth daily at 12 noon.      Physical Exam: Vitals:   07/18/18 0139 07/18/18 0141 07/18/18 0405  BP: 126/83  116/70  Pulse: (!) 101    Resp: 18    Temp: (!) 97.4 F (36.3 C)    TempSrc: Oral    SpO2: 100%    Weight:  96.2 kg   Height:   (1.727 m)     Temp:  [97.4 F (36.3 C)] 97.4 F (36.3 C) (03/22 0139) Pulse Rate:  [101] 101 (03/22 0139) Resp:  [18] 18 (03/22 0139) BP: (116-126)/(70-83) 116/70 (03/22 0405) SpO2:  [100 %] 100 % (03/22 0139) Weight:  [96.2 kg] 96.2 kg (03/22 0141) No intake/output data recorded. No  intake/output data recorded. No intake or output data in the 24 hours ending 07/18/18 0452  Body mass index is 32.23 kg/m. Constitutional: Well nourished, well developed female in no acute distress.  HEENT: normal Neck:  Supple, normal appearance, and no thyromegaly  Cardiovascular:Regular rate and rhythm.  No murmurs, rubs or gallops. Respiratory:  Clear to auscultation bilateral. Normal respiratory effort Abdomen: positive bowel sounds and no masses, hernias; diffusely  Tender in lower quadrants with R>L pain to palpation, non distended Neuro: grossly intact Psych:  Normal mood and affect.  Skin:  Warm and dry.  MS: normal gait and normal bilateral lower extremity strength/ROM/symmetry Lymphatic:  No inguinal lymphadenopathy.   Recent Results (from the past 2160 hour(s))  CBC with Differential     Status: None   Collection Time: 07/12/18  2:15 PM  Result Value Ref Range   WBC 5.6 4.0 - 10.5 K/uL   RBC 4.61 3.87 - 5.11 MIL/uL   Hemoglobin 13.3 12.0 - 15.0 g/dL   HCT 65.7 84.6 - 96.2 %   MCV 88.9 80.0 - 100.0 fL   MCH 28.9 26.0 - 34.0 pg   MCHC 32.4 30.0 - 36.0 g/dL   RDW 95.2 84.1 - 32.4 %   Platelets 229 150 - 400 K/uL   nRBC 0.0 0.0 - 0.2 %   Neutrophils Relative % 68 %   Neutro Abs 3.8 1.7 - 7.7 K/uL   Lymphocytes Relative 23 %   Lymphs Abs 1.3 0.7 - 4.0 K/uL   Monocytes Relative 8 %   Monocytes Absolute 0.4 0.1 - 1.0 K/uL   Eosinophils Relative 1 %   Eosinophils Absolute 0.1 0.0 - 0.5 K/uL   Basophils Relative 0 %   Basophils Absolute 0.0 0.0 - 0.1 K/uL   Immature Granulocytes 0 %   Abs Immature Granulocytes 0.02 0.00 - 0.07 K/uL    Comment: Performed at [facility], [address] [place], [place] [address]  Comprehensive metabolic panel     Status: None   Collection Time: 07/12/18  2:15 PM  Result Value Ref Range   Sodium 139 135 - 145 mmol/L   Potassium 4.1 3.5 - 5.1 mmol/L   Chloride 107 98 - 111 mmol/L   CO2 23 22 - 32 mmol/L   Glucose, Bld 91  70 -  99 mg/dL   BUN 9 6 - 20 mg/dL   Creatinine, Ser 1.61 0.44 - 1.00 mg/dL   Calcium 9.0 8.9 - 09.6 mg/dL   Total Protein 7.3 6.5 - 8.1 g/dL   Albumin 4.4 3.5 - 5.0 g/dL   AST 21 15 - 41 U/L   ALT 13 0 - 44 U/L   Alkaline Phosphatase 72 38 - 126 U/L   Total Bilirubin 0.6 0.3 - 1.2 mg/dL   GFR calc non Af Amer >60 >60 mL/min   GFR calc Af Amer >60 >60 mL/min   Anion gap 9 5 - 15    Comment: Performed at [facility], [address] [place], [place] [address]  hCG, quantitative, pregnancy     Status: Abnormal   Collection Time: 07/12/18  2:16 PM  Result Value Ref Range   hCG, Beta Chain, Quant, S 515 (H) <5 mIU/mL    Comment:          GEST. AGE      CONC.  (mIU/mL)   <=1 WEEK        5 - 50     2 WEEKS       50 - 500     3 WEEKS       100 - 10,000     4 WEEKS     1,000 - 30,000     5 WEEKS     3,500 - 115,000   6-8 WEEKS     12,000 - 270,000    12 WEEKS     15,000 - 220,000        FEMALE AND NON-PREGNANT FEMALE:     LESS THAN 5 mIU/mL Performed at [facility], [address] [place], [place] [address]   ABO/Rh     Status: None   Collection Time: 07/12/18  2:16 PM  Result Value Ref Range   ABO/RH(D)      B POS Performed at [facility], [address] [place], [place] [address]   Pregnancy, urine POC     Status: Abnormal   Collection Time: 07/12/18  2:24 PM  Result Value Ref Range   Preg Test, Ur POSITIVE (A) NEGATIVE    Comment:        THE SENSITIVITY OF THIS METHODOLOGY IS >24 mIU/mL   Pregnancy, urine POC     Status: Abnormal   Collection Time: 07/12/18  2:37 PM  Result Value Ref Range   Preg Test, Ur POSITIVE (A) NEGATIVE    Comment:        THE SENSITIVITY OF THIS METHODOLOGY IS >24 mIU/mL   Beta hCG quant (ref lab)     Status: None   Collection Time: 07/14/18  2:42 PM  Result Value Ref Range   hCG Quant 823 mIU/mL    Comment:                      Female (Non-pregnant)    0 -     5                              (Postmenopausal)  0 -     8                      Female (Pregnant)                      Weeks  of Gestation                              3                6 -    71                              4               10 -   750                              5              217 -  7138                              6              158 - 31795                              7             [id number] [id number]                              8            32065 -[phone]                              9            63803 -[phone]                             10            46509 -[phone]                             12            27832 -[phone]                             14            13950 - 62530                             15            12039 - 70971                             16             9040 - 56451                             17             [phone] - [phone]  18             8099 - [phone] Roche E CLIA methodology   Beta hCG quant (ref lab)     Status: None   Collection Time: 07/16/18 10:55 AM  Result Value Ref Range   hCG Quant 1,573 mIU/mL    Comment:                      Female (Non-pregnant)    0 -     5                             (Postmenopausal)  0 -     8                      Female (Pregnant)                      Weeks of Gestation                              3                6 -    71                              4               10 -   750                              5              217 -  7138                              6              158 - 31795                              7             3697 -[phone]                              8            32065 -045409                              8            11914 -782956                             21            30865 -784696                             29            [phone] -[phone]  14            13950 - 62530                             15            12039 - 70971                             16              9040 - 56451                             17             8175 - 55868                             18             [phone] Roche E CLIA methodology   Urinalysis, Routine w reflex microscopic     Status: Abnormal   Collection Time: 07/18/18  1:53 AM  Result Value Ref Range   Color, Urine YELLOW (A) YELLOW   APPearance CLEAR (A) CLEAR   Specific Gravity, Urine 1.021 1.005 - 1.030   pH 7.0 5.0 - 8.0   Glucose, UA NEGATIVE NEGATIVE mg/dL   Hgb urine dipstick SMALL (A) NEGATIVE   Bilirubin Urine NEGATIVE NEGATIVE   Ketones, ur NEGATIVE NEGATIVE mg/dL   Protein, ur NEGATIVE NEGATIVE mg/dL   Nitrite NEGATIVE NEGATIVE   Leukocytes,Ua NEGATIVE NEGATIVE   RBC / HPF 11-20 0 - 5 RBC/hpf   WBC, UA 0-5 0 - 5 WBC/hpf   Bacteria, UA NONE SEEN NONE SEEN   Squamous Epithelial / LPF 0-5 0 - 5   Mucus PRESENT     Comment: Performed at [facility], [address] [place], [place] [address]  hCG, quantitative, pregnancy     Status: Abnormal   Collection Time: 07/18/18  1:59 AM  Result Value Ref Range   hCG, Beta Chain, Quant, S 3,353 (H) <5 mIU/mL    Comment:          GEST. AGE      CONC.  (mIU/mL)   <=1 WEEK        5 - 50     2 WEEKS       50 - 500     3 WEEKS       100 - 10,000     4 WEEKS     1,000 - 30,000     5 WEEKS     3,500 - 115,000   6-8 WEEKS     12,000 - 270,000    12 WEEKS     15,000 - 220,000        FEMALE AND NON-PREGNANT FEMALE:     LESS THAN 5 mIU/mL Performed at [facility], [address], [place], [place] [address]    Imaging:  Ultrasound independently reviewed/interpreted by self.  EXAM: OBSTETRIC <14 WK [place] AND TRANSVAGINAL OB US  TECHNIQUE: Both transabdominal and transvaginal ultrasound examinations were performed for complete evaluation of the gestation as well as the maternal uterus, adnexal regions, and pelvic cul-de-sac. Transvaginal technique was performed to assess early pregnancy.  COMPARISON:  Prior ultrasound from  07/12/2018  FINDINGS: Intrauterine  gestational sac: Not visualized.  Yolk sac:  Negative.  Embryo:  Negative.  Cardiac Activity: N/A  Heart Rate: N/A  bpm  Subchorionic hemorrhage:  None visualized.  Maternal uterus/adnexae: 4.3 x 3.4 x 4.1 cm simple anechoic cyst seen within the left ovary. Left ovary otherwise unremarkable.  Large amount of complex free fluid seen surrounding the right ovary and within the right adnexa, which could reflect blood products. Fluid extends into the left adnexa as well. No definite discrete ectopic pregnancy or other adnexal mass.  IMPRESSION: Persistent non localization of the pregnancy, with no IUP identified. Large amount of complex fluid surrounding the right ovary and within the right adnexa, which could reflect blood products. Findings raise the possibility for an occult ruptured ectopic pregnancy, although no definite discrete ectopic or other adnexal mass visualized on this exam. Gynecologic consultation with close clinical monitoring as well as correlation with symptomatology and serum beta HCG levels recommended. Additionally, close interval follow-up ultrasound recommended as clinically warranted.   Assessment: Ms. Gambrell is a [name] (Patient's last menstrual period was 05/05/2018 (exact date).) who presented to the ED with complaints of RLQ pain; findings are consistent with right ruptured ectopic pregnancy. Although the beta hCG levels are still rising, the pattern along with pain and also ultrasound findings are most concerning for ectopic pregnancy and urgent sequele necessitating laparoscopy to evaluate further and to treat.  Methotrexate discussed but not a considered option with the degree of pain she is having and potential hemorrhage int the abdomen.  Plan: Laparoscopy with right salpingostomy possible salpingectomy discussed.  The pros and cons of surgery discussed.  Risks of future repeat ectopic and  fertility concerns also discussed.  The risks of surgery discussed with the patient included but were not limited to: bleeding which may require transfusion or reoperation; infection which may require antibiotics; injury to bowel, bladder, ureters or other surrounding organs; injury to the fetus; need for additional procedures including hysterectomy in the event of a life-threatening hemorrhage;  incisional problems, thromboembolic phenomenon and other postoperative/anesthesia complications. The patient concurred with the proposed plan, giving informed written consent for the procedure.   Annamarie Major, MD, [facility], [facility] 07/18/2018  4:52 AM Pager [phone]

## 2018-07-18 NOTE — Op Note (Signed)
Heather Jacobs PROCEDURE DATE: 07/18/2018  PREOPERATIVE DIAGNOSIS: Ruptured ectopic pregnancy POSTOPERATIVE DIAGNOSIS: Ruptured right fallopian tube ectopic pregnancy PROCEDURE: Laparoscopic right salpingostomy and removal of ectopic pregnancy SURGEON:  Dr. Annamarie Major ANESTHESIOLOGIST: Berdine Addison, MD  ANES: General  INDICATIONS: [name] here with the preoperative diagnoses as listed above.  Please refer to preoperative notes for more details. Patient was counseled regarding need for laparoscopic salpingectomy. Risks of surgery including bleeding which may require transfusion or reoperation, infection, injury to bowel or other surrounding organs, need for additional procedures including laparotomy and other postoperative/anesthesia complications were explained to patient.  Written informed consent was obtained.  FINDINGS:  Large amount of hemoperitoneum estimated to be about of blood and clots.  Dilated right fallopian tube containing likely ectopic gestation. Small normal appearing uterus, normal left fallopian tube, right ovary and left ovary with small ovarian cyst.  ANESTHESIA: General ESTIMATED BLOOD LOSS: 800 ml blood and clot aspirated by end of case URINE OUTPUT: 25 ml SPECIMENS: Right ectopic gestation within blood clots and from salpingostomy COMPLICATIONS: None immediate  PROCEDURE IN DETAIL:  The patient was taken to the operating room where general anesthesia was administered and was found to be adequate.  She was placed in the dorsal lithotomy position, and was prepped and draped in a sterile manner.  A Foley catheter was inserted into her bladder and attached to constant drainage and a sponge stick was placed vaginally for any manipulation purposes.  Attention was turned to the abdomen where an umbilical incision was made with the scalpel.  Veress needle is placed with confirmation using the hanging drop technique.   The abdomen was then insufflated with carbon  dioxide gas and adequate pneumoperitoneum was obtained.  The 5-mm trocar and sleeve were then advanced without difficulty with the laparoscope under direct visualization into the abdomen.   A survey of the patient's pelvis and abdomen revealed the findings above.  A 11-mm right lower quadrant port was then placed under direct visualization. Additional 5-mm trocar placed in suprapubic region. The suction irrigator was then used to suction the hemoperitoneum and irrigate the pelvis.  Attention was then turned to the right fallopian tube which was gently grasped and dissected ina linear salpingostomy approach using the Harmonic instrument.  Good hemostasis was noted.  Using the suction irrigator, the tube is flushed with clear fluid noted through and out the fimbriated end of the tube.  The blood clots were considered specimen.  Pelvic cavity irrigated, excellent hemostasis noted.  The right lower quadrant fascia incision is closed with a 0 vicryl suture.  The abdomen was desufflated, and all instruments were removed.  All skin incisions were closed Dermabond. The patient tolerated the procedure well.  All instruments, needles, and sponge counts were correct x 2. The patient was taken to the recovery room in stable condition.   The patient will be discharged to home as per PACU criteria.  Routine postoperative instructions given.   Annamarie Major, MD, [facility], [facility] 07/18/2018  6:49 AM

## 2018-07-18 NOTE — ED Provider Notes (Addendum)
[facility] Emergency Department Provider Note    First MD Initiated Contact with Patient 07/18/18 0205     (approximate)  I have reviewed the triage vital signs and the nursing notes.   HISTORY  Chief Complaint Abdominal Pain    HPI Heather Jacobs is a 23 y.o. female G2 P1 last menstrual period January 8 returns to the emergency department with progressive pelvic discomfort and vaginal bleeding.  Patient was seen on 07/12/2018 following a positive home pregnancy test with hCG quantitative was at 515 and subsequently 823 on the 18th and 1573 on the 20th.  Patient states tonight pain worsened as well as bleeding.  Patient states that she has used 3 pads today.  Current pain score 8 out of 10        History reviewed. No pertinent past medical history.  There are no active problems to display for this patient.   Past Surgical History:  Procedure Laterality Date   ARTHROSCOPIC REPAIR ACL      Prior to Admission medications   Medication Sig Start Date End Date Taking? Authorizing Provider  prenatal vitamin w/FE, FA (PRENATAL 1 + 1) 27-1 MG TABS tablet Take 1 tablet by mouth daily at 12 noon.    [provider]    Allergies Chlorhexidine  No family history on file.  Social History Social History   Tobacco Use   Smoking status: Never Smoker   Smokeless tobacco: Never Used  Substance Use Topics   Alcohol use: Never    Frequency: Never   Drug use: Never    Review of Systems Constitutional: No fever/chills Eyes: No visual changes. ENT: No sore throat. Cardiovascular: Denies chest pain. Respiratory: Denies shortness of breath. Gastrointestinal: No abdominal pain.  No nausea, no vomiting.  No diarrhea.  No constipation. Genitourinary: Positive for pelvic pain and vaginal bleeding. Musculoskeletal: Negative for neck pain.  Negative for back pain. Integumentary: Negative for rash. Neurological: Negative for headaches, focal  weakness or numbness.  ____________________________________________   PHYSICAL EXAM:  VITAL SIGNS: ED Triage Vitals  Enc Vitals Group     BP 07/18/18 0139 126/83     Pulse Rate 07/18/18 0139 (!) 101     Resp 07/18/18 0139 18     Temp 07/18/18 0139 (!) 97.4 F (36.3 C)     Temp Source 07/18/18 0139 Oral     SpO2 07/18/18 0139 100 %     Weight 07/18/18 0141 96.2 kg (212 lb)     Height 07/18/18 0141 1.727 m (5\' 8" )     Head Circumference --      Peak Flow --      Pain Score 07/18/18 0141 8     Pain Loc --      Pain Edu? --      Excl. in GC? --     Constitutional: Alert and oriented.  Apparent discomfort Eyes: Conjunctivae are normal.  Mouth/Throat: Mucous membranes are moist.  Oropharynx non-erythematous. Neck: No stridor.   Cardiovascular: Normal rate, regular rhythm. Good peripheral circulation. Grossly normal heart sounds. Respiratory: Normal respiratory effort.  No retractions. Lungs CTAB. Gastrointestinal: Right lower quadrant/left lower quadrant tenderness to palpation.  No distention.  Genitourinary: No abnormality noted on external genitalia.  Approximately 10 to 20 mL's of dark blood noted in the vagina with scant bleeding from the cervical office. Musculoskeletal: No lower extremity tenderness nor edema. No gross deformities of extremities. Neurologic:  Normal speech and language. No gross focal neurologic deficits are appreciated.  Skin:  Skin is warm, dry and intact. No rash noted. Psychiatric: Mood and affect are normal. Speech and behavior are normal.  ____________________________________________   LABS (all labs ordered are listed, but only abnormal results are displayed)  Labs Reviewed  URINALYSIS, ROUTINE W REFLEX MICROSCOPIC - Abnormal; Notable for the following components:      Result Value   Color, Urine YELLOW (*)    APPearance CLEAR (*)    Hgb urine dipstick SMALL (*)    All other components within normal limits  HCG, QUANTITATIVE, PREGNANCY -  Abnormal; Notable for the following components:   hCG, Beta Chain, Quant, S 3,353 (*)    All other components within normal limits   ____________________________________  RADIOLOGY I, [place] N Denesha Brouse, personally viewed and evaluated these images (plain radiographs) as part of my medical decision making, as well as reviewing the written report by the radiologist.  ED MD interpretation: No IUP identified large amount of complex fluid surrounding the right ovary and within the right adnexa could reflect blood products  Official radiology report(s): US Ob Comp Less 14 Wks  Result Date: 07/18/2018 CLINICAL DATA:  Initial evaluation for acute vaginal bleeding, early pregnancy. EXAM: OBSTETRIC <14 WK [place] AND TRANSVAGINAL OB US TECHNIQUE: Both transabdominal and transvaginal ultrasound examinations were performed for complete evaluation of the gestation as well as the maternal uterus, adnexal regions, and pelvic cul-de-sac. Transvaginal technique was performed to assess early pregnancy. COMPARISON:  Prior ultrasound from 07/12/2018 FINDINGS: Intrauterine gestational sac: Not visualized. Yolk sac:  Negative. Embryo:  Negative. Cardiac Activity: N/A Heart Rate: N/A  bpm Subchorionic hemorrhage:  None visualized. Maternal uterus/adnexae: 4.3 x 3.4 x 4.1 cm simple anechoic cyst seen within the left ovary. Left ovary otherwise unremarkable. Large amount of complex free fluid seen surrounding the right ovary and within the right adnexa, which could reflect blood products. Fluid extends into the left adnexa as well. No definite discrete ectopic pregnancy or other adnexal mass. IMPRESSION: Persistent non localization of the pregnancy, with no IUP identified. Large amount of complex fluid surrounding the right ovary and within the right adnexa, which could reflect blood products. Findings raise the possibility for an occult ruptured ectopic pregnancy, although no definite discrete ectopic or other adnexal mass  visualized on this exam. Gynecologic consultation with close clinical monitoring as well as correlation with symptomatology and serum beta HCG levels recommended. Additionally, close interval follow-up ultrasound recommended as clinically warranted. Critical Value/emergent results were called by telephone at the time of interpretation on 07/18/2018 at 4:11 am to Dr. Chiquita Loth , who verbally acknowledged these results. Electronically Signed   By: Rise Mu M.D.   On: 07/18/2018 04:16   US Ob Transvaginal  Result Date: 07/18/2018 CLINICAL DATA:  Initial evaluation for acute vaginal bleeding, early pregnancy. EXAM: OBSTETRIC <14 WK [place] AND TRANSVAGINAL OB US TECHNIQUE: Both transabdominal and transvaginal ultrasound examinations were performed for complete evaluation of the gestation as well as the maternal uterus, adnexal regions, and pelvic cul-de-sac. Transvaginal technique was performed to assess early pregnancy. COMPARISON:  Prior ultrasound from 07/12/2018 FINDINGS: Intrauterine gestational sac: Not visualized. Yolk sac:  Negative. Embryo:  Negative. Cardiac Activity: N/A Heart Rate: N/A  bpm Subchorionic hemorrhage:  None visualized. Maternal uterus/adnexae: 4.3 x 3.4 x 4.1 cm simple anechoic cyst seen within the left ovary. Left ovary otherwise unremarkable. Large amount of complex free fluid seen surrounding the right ovary and within the right adnexa, which could reflect blood products. Fluid extends into the left  adnexa as well. No definite discrete ectopic pregnancy or other adnexal mass. IMPRESSION: Persistent non localization of the pregnancy, with no IUP identified. Large amount of complex fluid surrounding the right ovary and within the right adnexa, which could reflect blood products. Findings raise the possibility for an occult ruptured ectopic pregnancy, although no definite discrete ectopic or other adnexal mass visualized on this exam. Gynecologic consultation with close clinical  monitoring as well as correlation with symptomatology and serum beta HCG levels recommended. Additionally, close interval follow-up ultrasound recommended as clinically warranted. Critical Value/emergent results were called by telephone at the time of interpretation on 07/18/2018 at 4:11 am to Dr. Chiquita Loth , who verbally acknowledged these results. Electronically Signed   By: Rise Mu M.D.   On: 07/18/2018 04:16    _____________ .Critical Care Performed by: Darci Current, MD Authorized by: Darci Current, MD   Critical care provider statement:    Critical care time (minutes):  30   Critical care time was exclusive of:  Separately billable procedures and treating other patients (Ruptured ectopic pregnancy)   Critical care was time spent personally by me on the following activities:  Development of treatment plan with patient or surrogate, discussions with consultants, evaluation of patient's response to treatment, examination of patient, obtaining history from patient or surrogate, ordering and performing treatments and interventions, ordering and review of laboratory studies, ordering and review of radiographic studies, pulse oximetry, re-evaluation of patient's condition and review of old charts     ____________________________________________   INITIAL IMPRESSION / MDM / ASSESSMENT AND PLAN / ED COURSE  As part of my medical decision making, I reviewed the following data within the electronic MEDICAL RECORD NUMBER  23 year old female presenting with above-stated history and physical exam concerning for possible ectopic pregnancy versus miscarriage.  Patient's hCG quantitative increased to 3353.  Pelvic ultrasound performed revealed no intrauterine pregnancy with large amount of complex fluid surrounding the right ovary and within the right adnexa concerning for possible blood products.  Given finding of hCG beyond the discriminatory zone as well as ultrasound findings suspect  ruptured ectopic pregnancy.  Patient discussed with Dr. Tiburcio Pea who evaluated patient emergency department with plan to take the patient to the operating room.     ____________________________________________  FINAL CLINICAL IMPRESSION(S) / ED DIAGNOSES  Final diagnoses:  Ruptured right tubal ectopic pregnancy causing hemoperitoneum     MEDICATIONS GIVEN DURING THIS VISIT:  Medications  morphine 2 MG/ML injection 2 mg (2 mg Intravenous Given 07/18/18 0417)  ondansetron (ZOFRAN) injection 4 mg (4 mg Intravenous Given 07/18/18 0418)     ED Discharge Orders    None       Note:  This document was prepared using Dragon voice recognition software and may include unintentional dictation errors.   Darci Current, MD 07/18/18 0448    Darci Current, MD 07/18/18 [phone]

## 2018-07-19 ENCOUNTER — Encounter: Payer: Self-pay | Admitting: Obstetrics & Gynecology

## 2018-07-20 LAB — SURGICAL PATHOLOGY

## 2018-07-22 ENCOUNTER — Ambulatory Visit: Payer: Medicaid Other | Admitting: Obstetrics and Gynecology

## 2018-07-22 ENCOUNTER — Other Ambulatory Visit: Payer: Medicaid Other

## 2018-07-27 ENCOUNTER — Ambulatory Visit (INDEPENDENT_AMBULATORY_CARE_PROVIDER_SITE_OTHER): Payer: Medicaid Other | Admitting: Obstetrics & Gynecology

## 2018-07-27 ENCOUNTER — Encounter: Payer: Self-pay | Admitting: Obstetrics & Gynecology

## 2018-07-27 ENCOUNTER — Other Ambulatory Visit: Payer: Self-pay

## 2018-07-27 VITALS — BP 130/80 | Ht 68.5 in | Wt 208.0 lb

## 2018-07-27 DIAGNOSIS — O00101 Right tubal pregnancy without intrauterine pregnancy: Secondary | ICD-10-CM

## 2018-07-27 NOTE — Progress Notes (Signed)
  Postoperative Follow-up Patient presents post op from laparoscopy and right salingostomy for right sided ectopic pregnancy, 1 week ago.  Subjective: Patient reports marked improvement in her preop symptoms. Eating a regular diet without difficulty. The patient is not having any pain.  Activity: normal activities of daily living. Patient reports additional symptom's since surgery of No further bleedin (bled for two days)  Pt preop had beta rise from 800 to 1500 to 3000.  However sx's of pain and bleeding along w US showing right sided tubal mass and blood led to need for surgery.  Surgery revealed swollen and bleeding right tube.  Pathology did not reveal any pregnancy related tissue.  Denies nausea, breast T, current bleeding or pain.  Objective: BP 130/80   Ht 5' 8.5" (1.74 m)   Wt 208 lb (94.3 kg)   LMP 05/05/2018 (Exact Date)   BMI 31.17 kg/m  Physical Exam Constitutional:      General: She is not in acute distress.    Appearance: She is well-developed.  Cardiovascular:     Rate and Rhythm: Normal rate.  Pulmonary:     Effort: Pulmonary effort is normal.  Abdominal:     General: There is no distension.     Palpations: Abdomen is soft.     Tenderness: There is no abdominal tenderness.     Comments: Incision Healing Well   Musculoskeletal: Normal range of motion.  Neurological:     Mental Status: She is alert and oriented to person, place, and time.     Cranial Nerves: No cranial nerve deficit.  Skin:    General: Skin is warm and dry.     Assessment: s/p :  laparoscopy and right salpingostomy progressing well  Plan: Patient has done well after surgery with no apparent complications.  I have discussed the post-operative course to date, and the expected progress moving forward.  The patient understands what complications to be concerned about.  I will see the patient in routine follow up, or sooner if needed.    Beta today. As pathology had no POC, need to confirm it  returns to zero (No IUP).    Activity plan: No restriction. Plan PAP soon.  Also may consider IUD for birth control; info given and counseled as to all options.  Letitia Libra 07/27/2018, 11:02 AM

## 2018-07-27 NOTE — Patient Instructions (Signed)

## 2018-07-28 LAB — BETA HCG QUANT (REF LAB): hCG Quant: 30 m[IU]/mL

## 2018-11-12 ENCOUNTER — Other Ambulatory Visit (HOSPITAL_COMMUNITY)
Admission: RE | Admit: 2018-11-12 | Discharge: 2018-11-12 | Disposition: A | Discharge status: Home / Self Care | Payer: Medicaid Other | Source: Ambulatory Visit | Attending: Maternal Newborn | Admitting: Maternal Newborn

## 2018-11-12 ENCOUNTER — Ambulatory Visit (INDEPENDENT_AMBULATORY_CARE_PROVIDER_SITE_OTHER): Payer: Medicaid Other | Admitting: Maternal Newborn

## 2018-11-12 ENCOUNTER — Encounter: Payer: Self-pay | Admitting: Maternal Newborn

## 2018-11-12 ENCOUNTER — Other Ambulatory Visit: Payer: Self-pay

## 2018-11-12 VITALS — BP 124/78 | Wt 222.0 lb

## 2018-11-12 DIAGNOSIS — Z3480 Encounter for supervision of other normal pregnancy, unspecified trimester: Secondary | ICD-10-CM | POA: Insufficient documentation

## 2018-11-12 DIAGNOSIS — Z113 Encounter for screening for infections with a predominantly sexual mode of transmission: Secondary | ICD-10-CM

## 2018-11-12 DIAGNOSIS — Z3481 Encounter for supervision of other normal pregnancy, first trimester: Secondary | ICD-10-CM | POA: Diagnosis not present

## 2018-11-12 DIAGNOSIS — Z6833 Body mass index (BMI) 33.0-33.9, adult: Secondary | ICD-10-CM

## 2018-11-12 DIAGNOSIS — Z3A10 10 weeks gestation of pregnancy: Secondary | ICD-10-CM | POA: Diagnosis not present

## 2018-11-12 DIAGNOSIS — Z369 Encounter for antenatal screening, unspecified: Secondary | ICD-10-CM

## 2018-11-12 NOTE — Patient Instructions (Signed)
First Trimester of Pregnancy The first trimester of pregnancy is from week 1 until the end of week 13 (months 1 through 3). A week after a sperm fertilizes an egg, the egg will implant on the wall of the uterus. This embryo will begin to develop into a baby. Genes from you and your partner will form the baby. The female genes will determine whether the baby will be a boy or a girl. At 6-8 weeks, the eyes and face will be formed, and the heartbeat can be seen on ultrasound. At the end of 12 weeks, all the baby's organs will be formed. Now that you are pregnant, you will want to do everything you can to have a healthy baby. Two of the most important things are to get good prenatal care and to follow your health care provider's instructions. Prenatal care is all the medical care you receive before the baby's birth. This care will help prevent, find, and treat any problems during the pregnancy and childbirth. Body changes during your first trimester Your body goes through many changes during pregnancy. The changes vary from woman to woman.  You may gain or lose a couple of pounds at first.  You may feel sick to your stomach (nauseous) and you may throw up (vomit). If the vomiting is uncontrollable, call your health care provider.  You may tire easily.  You may develop headaches that can be relieved by medicines. All medicines should be approved by your health care provider.  You may urinate more often. Painful urination may mean you have a bladder infection.  You may develop heartburn as a result of your pregnancy.  You may develop constipation because certain hormones are causing the muscles that push stool through your intestines to slow down.  You may develop hemorrhoids or swollen veins (varicose veins).  Your breasts may begin to grow larger and become tender. Your nipples may stick out more, and the tissue that surrounds them (areola) may become darker.  Your gums may bleed and may be  sensitive to brushing and flossing.  Dark spots or blotches (chloasma, mask of pregnancy) may develop on your face. This will likely fade after the baby is born.  Your menstrual periods will stop.  You may have a loss of appetite.  You may develop cravings for certain kinds of food.  You may have changes in your emotions from day to day, such as being excited to be pregnant or being concerned that something may go wrong with the pregnancy and baby.  You may have more vivid and strange dreams.  You may have changes in your hair. These can include thickening of your hair, rapid growth, and changes in texture. Some women also have hair loss during or after pregnancy, or hair that feels dry or thin. Your hair will most likely return to normal after your baby is born. What to expect at prenatal visits During a routine prenatal visit:  You will be weighed to make sure you and the baby are growing normally.  Your blood pressure will be taken.  Your abdomen will be measured to track your baby's growth.  The fetal heartbeat will be listened to between weeks 10 and 14 of your pregnancy.  Test results from any previous visits will be discussed. Your health care provider may ask you:  How you are feeling.  If you are feeling the baby move.  If you have had any abnormal symptoms, such as leaking fluid, bleeding, severe headaches, or abdominal   cramping.  If you are using any tobacco products, including cigarettes, chewing tobacco, and electronic cigarettes.  If you have any questions. Other tests that may be performed during your first trimester include:  Blood tests to find your blood type and to check for the presence of any previous infections. The tests will also be used to check for low iron levels (anemia) and protein on red blood cells (Rh antibodies). Depending on your risk factors, or if you previously had diabetes during pregnancy, you may have tests to check for high blood sugar  that affects pregnant women (gestational diabetes).  Urine tests to check for infections, diabetes, or protein in the urine.  An ultrasound to confirm the proper growth and development of the baby.  Fetal screens for spinal cord problems (spina bifida) and Down syndrome.  HIV (human immunodeficiency virus) testing. Routine prenatal testing includes screening for HIV, unless you choose not to have this test.  You may need other tests to make sure you and the baby are doing well. Follow these instructions at home: Medicines  Follow your health care provider's instructions regarding medicine use. Specific medicines may be either safe or unsafe to take during pregnancy.  Take a prenatal vitamin that contains at least 600 micrograms (mcg) of folic acid.  If you develop constipation, try taking a stool softener if your health care provider approves. Eating and drinking   Eat a balanced diet that includes fresh fruits and vegetables, whole grains, good sources of protein such as meat, eggs, or tofu, and low-fat dairy. Your health care provider will help you determine the amount of weight gain that is right for you.  Avoid raw meat and uncooked cheese. These carry germs that can cause birth defects in the baby.  Eating four or five small meals rather than three large meals a day may help relieve nausea and vomiting. If you start to feel nauseous, eating a few soda crackers can be helpful. Drinking liquids between meals, instead of during meals, also seems to help ease nausea and vomiting.  Limit foods that are high in fat and processed sugars, such as fried and sweet foods.  To prevent constipation: ? Eat foods that are high in fiber, such as fresh fruits and vegetables, whole grains, and beans. ? Drink enough fluid to keep your urine clear or pale yellow. Activity  Exercise only as directed by your health care provider. Most women can continue their usual exercise routine during  pregnancy. Try to exercise for 30 minutes at least 5 days a week. Exercising will help you: ? Control your weight. ? Stay in shape. ? Be prepared for labor and delivery.  Experiencing pain or cramping in the lower abdomen or lower back is a good sign that you should stop exercising. Check with your health care provider before continuing with normal exercises.  Try to avoid standing for long periods of time. Move your legs often if you must stand in one place for a long time.  Avoid heavy lifting.  Wear low-heeled shoes and practice good posture.  You may continue to have sex unless your health care provider tells you not to. Relieving pain and discomfort  Wear a good support bra to relieve breast tenderness.  Take warm sitz baths to soothe any pain or discomfort caused by hemorrhoids. Use hemorrhoid cream if your health care provider approves.  Rest with your legs elevated if you have leg cramps or low back pain.  If you develop varicose veins in   your legs, wear support hose. Elevate your feet for 15 minutes, 3-4 times a day. Limit salt in your diet. Prenatal care  Schedule your prenatal visits by the twelfth week of pregnancy. They are usually scheduled monthly at first, then more often in the last 2 months before delivery.  Write down your questions. Take them to your prenatal visits.  Keep all your prenatal visits as told by your health care provider. This is important. Safety  Wear your seat belt at all times when driving.  Make a list of emergency phone numbers, including numbers for family, friends, the hospital, and police and fire departments. General instructions  Ask your health care provider for a referral to a local prenatal education class. Begin classes no later than the beginning of month 6 of your pregnancy.  Ask for help if you have counseling or nutritional needs during pregnancy. Your health care provider can offer advice or refer you to specialists for help  with various needs.  Do not use hot tubs, steam rooms, or saunas.  Do not douche or use tampons or scented sanitary pads.  Do not cross your legs for long periods of time.  Avoid cat litter boxes and soil used by cats. These carry germs that can cause birth defects in the baby and possibly loss of the fetus by miscarriage or stillbirth.  Avoid all smoking, herbs, alcohol, and medicines not prescribed by your health care provider. Chemicals in these products affect the formation and growth of the baby.  Do not use any products that contain nicotine or tobacco, such as cigarettes and e-cigarettes. If you need help quitting, ask your health care provider. You may receive counseling support and other resources to help you quit.  Schedule a dentist appointment. At home, brush your teeth with a soft toothbrush and be gentle when you floss. Contact a health care provider if:  You have dizziness.  You have mild pelvic cramps, pelvic pressure, or nagging pain in the abdominal area.  You have persistent nausea, vomiting, or diarrhea.  You have a bad smelling vaginal discharge.  You have pain when you urinate.  You notice increased swelling in your face, hands, legs, or ankles.  You are exposed to fifth disease or chickenpox.  You are exposed to German measles (rubella) and have never had it. Get help right away if:  You have a fever.  You are leaking fluid from your vagina.  You have spotting or bleeding from your vagina.  You have severe abdominal cramping or pain.  You have rapid weight gain or loss.  You vomit blood or material that looks like coffee grounds.  You develop a severe headache.  You have shortness of breath.  You have any kind of trauma, such as from a fall or a car accident. Summary  The first trimester of pregnancy is from week 1 until the end of week 13 (months 1 through 3).  Your body goes through many changes during pregnancy. The changes vary from  woman to woman.  You will have routine prenatal visits. During those visits, your health care provider will examine you, discuss any test results you may have, and talk with you about how you are feeling. This information is not intended to replace advice given to you by your health care provider. Make sure you discuss any questions you have with your health care provider. Document Released: 04/08/2001 Document Revised: 03/27/2017 Document Reviewed: 03/26/2016 Elsevier Patient Education  2020 [employer]  

## 2018-11-12 NOTE — Progress Notes (Signed)
NOB today. LMP 08/30/2018

## 2018-11-12 NOTE — Progress Notes (Signed)
11/12/2018   Chief Complaint: Desires prenatal care.  Transfer of Care Patient: no  History of Present Illness: Ms. Heather Jacobs is a 23 y.o. G3P1011 at 4791w4d based on Patient's last menstrual period on 08/30/2018, with an Estimated Date of Delivery: 06/06/2019, with the above CC.   Her periods were: irregular periods since having an ectopic pregnancy in March 2020. Last cycle May 4-10. She has Positive signs or symptoms of nausea/vomiting of pregnancy. She has Negative signs or symptoms of miscarriage or preterm labor She identifies Negative Zika risk factors for her and her partner On any different medications around the time she conceived/early pregnancy: No  History of varicella: No   Review of Systems  Constitutional: Positive for malaise/fatigue.       Change in appetite  HENT: Negative.   Eyes: Negative.   Respiratory: Negative for shortness of breath and wheezing.   Cardiovascular: Negative for chest pain and palpitations.  Gastrointestinal: Positive for nausea and vomiting.  Genitourinary: Positive for frequency and urgency. Negative for dysuria.  Musculoskeletal: Negative.   Skin: Negative.   Neurological: Positive for dizziness.  Endo/Heme/Allergies: Does not bruise/bleed easily.       Hot flashes  Psychiatric/Behavioral: Negative for depression. The patient is not nervous/anxious.   Breasts: Positive for breast tenderness. Negative for breast lumps.  Review of systems was otherwise negative, except as stated in the above HPI.  OBGYN History: As per HPI. OB History  Gravida Para Term Preterm AB Living  3 1 1   1 1   SAB TAB Ectopic Multiple Live Births      1   1    # Outcome Date GA Lbr Len/2nd Weight Sex Delivery Anes PTL Lv  3 Current           2 Ectopic 06/2018          1 Term 05/17/17 3048w0d  8 lb 12 oz (3.969 kg) M Vag-Spont EPI  LIV     Birth Comments: Punctured Spine with epidural     Any issues with any prior pregnancies: yes, ectopic pregnancy with D&C with  G2 Any prior children are healthy, doing well, without any problems or issues: yes History of pap smears: Yes. Last pap smear 10/03/2016. NILM History of STIs: No   Past Medical History: Past Medical History:  Diagnosis Date  . Ectopic pregnancy     Past Surgical History: Past Surgical History:  Procedure Laterality Date  . ARTHROSCOPIC REPAIR ACL    . LAPAROSCOPY N/A 07/18/2018   Procedure: LAPAROSCOPY DIAGNOSTIC,right salpingostomy;  Surgeon: Nadara MustardHarris, Robert P, MD;  Location: [facility] ORS;  Service: Gynecology;  Laterality: N/A;  ectopic supected to be in blood loss    Family History:  Family History  Problem Relation Age of Onset  . Diabetes Mellitus I Father   . Heart defect Father    She denies any female cancers, bleeding or blood clotting disorders.  She reports that her father had a congenital heart defect. No other history of genetic disorders/birth defect in her or her partner's family.  Social History:  Social History   Socioeconomic History  . Marital status: Married    Spouse name: Not on file  . Number of children: Not on file  . Years of education: Not on file  . Highest education level: Not on file  Occupational History  . Not on file  Social Needs  . Financial resource strain: Not on file  . Food insecurity    Worry: Not on file  Inability: Not on file  . Transportation needs    Medical: Not on file    Non-medical: Not on file  Tobacco Use  . Smoking status: Never Smoker  . Smokeless tobacco: Never Used  Substance and Sexual Activity  . Alcohol use: Never    Frequency: Never  . Drug use: Never  . Sexual activity: Yes    Birth control/protection: None  Lifestyle  . Physical activity    Days per week: Not on file    Minutes per session: Not on file  . Stress: Not on file  Relationships  . Social Herbalist on phone: Not on file    Gets together: Not on file    Attends religious service: Not on file    Active member of club or  organization: Not on file    Attends meetings of clubs or organizations: Not on file    Relationship status: Not on file  . Intimate partner violence    Fear of current or ex partner: Not on file    Emotionally abused: Not on file    Physically abused: Not on file    Forced sexual activity: Not on file  Other Topics Concern  . Not on file  Social History Narrative  . Not on file   Any cats in the household: no Domestic violence screening is negative.  Allergy: Allergies  Allergen Reactions  . Chlorhexidine Other (See Comments)    States she's allergic    Current Outpatient Medications:  Current Outpatient Medications:  .  prenatal vitamin w/FE, FA (PRENATAL 1 + 1) 27-1 MG TABS tablet, Take 1 tablet by mouth daily at 12 noon., Disp: , Rfl:  .  oxyCODONE-acetaminophen (PERCOCET/ROXICET) 5-325 MG tablet, Take 1 tablet by mouth every 4 (four) hours as needed for moderate pain. (Patient not taking: Reported on 07/27/2018), Disp: 30 tablet, Rfl: 0   Physical Exam:   BP 124/78   Wt 222 lb (100.7 kg)   LMP 08/30/2018   BMI 33.26 kg/m  Body mass index is 33.26 kg/m. Constitutional: Well nourished, well developed female in no acute distress.  Neck:  Supple, normal appearance, and no thyromegaly  Cardiovascular: S1, S2 normal, no murmur, rub or gallop, regular rate and rhythm Respiratory:  Clear to auscultation bilaterally. Normal respiratory effort Abdomen: No masses, hernias; diffusely non tender to palpation, non distended Breasts: lactating, declines exam Neuro/Psych:  Normal mood and affect.  Skin:  Warm and dry.  Lymphatic:  No inguinal lymphadenopathy.   Pelvic exam: is not limited by body habitus External genitalia, Bartholin's glands, Urethra, Skene's glands: within normal limits Vagina: within normal limits and with no blood in the vault  Cervix: deferred speculum exam Uterus:  enlarged, c/w early pregnancy Adnexa:  no mass, fullness, tenderness  Assessment: Ms.  Bou is a 23 y.o. G3P1011 at [redacted]w[redacted]d based on Patient's last menstrual period on 08/30/2018, with an Estimated Date of Delivery: 06/06/2019, presenting for prenatal care.  Plan:  1) Avoid alcoholic beverages. 2) Patient encouraged not to smoke.  3) Discontinue the use of all non-medicinal drugs and chemicals.  4) Take prenatal vitamins daily.  5) Seatbelt use advised 6) Nutrition, food safety (fish, cheese advisories, and high nitrite foods) and exercise discussed. 7) Hospital and practice style delivering at [facility] discussed  8) Patient is asked about travel to areas at risk for the [place] virus, and counseled to avoid travel and exposure to mosquitoes or partners who may have themselves been exposed to  the virus.  9) Genetic Screening, such as with 1st Trimester Screening, cell free fetal DNA, AFP testing, and Ultrasound was discussed with patient. She plans to have genetic testing this pregnancy. 10) GTT, NOB labs, and dating scan ordered; to return next week for these. 11) Sample of Bonjesta given for nausea/vomiting, she will call if she wants a prescription.  Problem list reviewed and updated.  Marcelyn BruinsJacelyn , CNM [facility], [facility] 11/12/2018  3:07 PM

## 2018-11-13 LAB — URINE DRUG PANEL 7
Amphetamines, Urine: NEGATIVE ng/mL
Barbiturate Quant, Ur: NEGATIVE ng/mL
Benzodiazepine Quant, Ur: NEGATIVE ng/mL
Cannabinoid Quant, Ur: NEGATIVE ng/mL
Cocaine (Metab.): NEGATIVE ng/mL
Opiate Quant, Ur: NEGATIVE ng/mL
PCP Quant, Ur: NEGATIVE ng/mL

## 2018-11-14 LAB — URINE CULTURE

## 2018-11-16 LAB — CERVICOVAGINAL ANCILLARY ONLY
Chlamydia: NEGATIVE
Neisseria Gonorrhea: NEGATIVE

## 2018-11-20 ENCOUNTER — Other Ambulatory Visit: Payer: Self-pay

## 2018-11-20 ENCOUNTER — Emergency Department
Admission: EM | Admit: 2018-11-20 | Discharge: 2018-11-20 | Disposition: A | Discharge status: Home / Self Care | Payer: Medicaid Other | Attending: Emergency Medicine | Admitting: Emergency Medicine

## 2018-11-20 ENCOUNTER — Emergency Department: Payer: Medicaid Other

## 2018-11-20 DIAGNOSIS — Z8759 Personal history of other complications of pregnancy, childbirth and the puerperium: Secondary | ICD-10-CM | POA: Diagnosis not present

## 2018-11-20 DIAGNOSIS — O26899 Other specified pregnancy related conditions, unspecified trimester: Secondary | ICD-10-CM

## 2018-11-20 DIAGNOSIS — R1031 Right lower quadrant pain: Secondary | ICD-10-CM | POA: Insufficient documentation

## 2018-11-20 DIAGNOSIS — O26891 Other specified pregnancy related conditions, first trimester: Secondary | ICD-10-CM | POA: Diagnosis present

## 2018-11-20 DIAGNOSIS — Z3A09 9 weeks gestation of pregnancy: Secondary | ICD-10-CM | POA: Diagnosis not present

## 2018-11-20 DIAGNOSIS — R102 Pelvic and perineal pain: Secondary | ICD-10-CM

## 2018-11-20 LAB — COMPREHENSIVE METABOLIC PANEL
ALT: 12 U/L (ref 0–44)
AST: 15 U/L (ref 15–41)
Albumin: 4.1 g/dL (ref 3.5–5.0)
Alkaline Phosphatase: 60 U/L (ref 38–126)
Anion gap: 6 (ref 5–15)
BUN: 9 mg/dL (ref 6–20)
CO2: 22 mmol/L (ref 22–32)
Calcium: 8.6 mg/dL — ABNORMAL LOW (ref 8.9–10.3)
Chloride: 108 mmol/L (ref 98–111)
Creatinine, Ser: 0.46 mg/dL (ref 0.44–1.00)
GFR calc Af Amer: >60 mL/min (ref >60)
GFR calc non Af Amer: >60 mL/min (ref >60)
Glucose, Bld: 91 mg/dL (ref 70–99)
Potassium: 3.6 mmol/L (ref 3.5–5.1)
Sodium: 136 mmol/L (ref 135–145)
Total Bilirubin: 0.7 mg/dL (ref 0.3–1.2)
Total Protein: 7.3 g/dL (ref 6.5–8.1)

## 2018-11-20 LAB — URINALYSIS, COMPLETE (UACMP) WITH MICROSCOPIC
Bilirubin Urine: NEGATIVE
Glucose, UA: NEGATIVE mg/dL
Hgb urine dipstick: NEGATIVE
Ketones, ur: NEGATIVE mg/dL
Leukocytes,Ua: NEGATIVE
Nitrite: NEGATIVE
Protein, ur: NEGATIVE mg/dL
Specific Gravity, Urine: 1.025 (ref 1.005–1.030)
pH: 6 (ref 5.0–8.0)

## 2018-11-20 LAB — CBC
HCT: 38.6 % (ref 36.0–46.0)
Hemoglobin: 12.6 g/dL (ref 12.0–15.0)
MCH: 27.9 pg (ref 26.0–34.0)
MCHC: 32.6 g/dL (ref 30.0–36.0)
MCV: 85.6 fL (ref 80.0–100.0)
Platelets: 191 10*3/uL (ref 150–400)
RBC: 4.51 MIL/uL (ref 3.87–5.11)
RDW: 15 % (ref 11.5–15.5)
WBC: 7.3 10*3/uL (ref 4.0–10.5)
nRBC: 0 % (ref 0.0–0.2)

## 2018-11-20 LAB — HCG, QUANTITATIVE, PREGNANCY: hCG, Beta Chain, Quant, S: 123715 m[IU]/mL — ABNORMAL HIGH (ref <5)

## 2018-11-20 LAB — LIPASE, BLOOD: Lipase: 36 U/L (ref 11–51)

## 2018-11-20 NOTE — ED Notes (Addendum)
Urine preg was not obtained in triage - blood hcg ordered with lab aware

## 2018-11-20 NOTE — ED Notes (Signed)
Unable to print labels due to failure in scanning. Chart labels sent to avoid delay in patient care.

## 2018-11-20 NOTE — ED Triage Notes (Signed)
Pt presents via POV c/o RLQ pain. Reports [redacted]wks pregnant. Hx Ectopic in March. Non-painful with palpation RLQ. Reports N/V.

## 2018-11-20 NOTE — ED Provider Notes (Signed)
[facility] Emergency Department Provider Note       Time seen: ----------------------------------------- 5:12 PM on 11/20/2018 -----------------------------------------   I have reviewed the triage vital signs and the nursing notes.  HISTORY   Chief Complaint Abdominal Pain    HPI Heather Jacobs is a 23 y.o. female with a history of ectopic pregnancy who presents to the ED for right lower quadrant pain.  Reportedly she is [redacted] weeks pregnant.  She had an ectopic pregnancy earlier this year.  She is G3 P1 Ab0.  She has an ultrasound scheduled on Tuesday.  She has not had any vaginal bleeding or leakage of fluid.  Past Medical History:  Diagnosis Date  . Ectopic pregnancy   . Ruptured right tubal ectopic pregnancy causing hemoperitoneum 07/18/2018    Patient Active Problem List   Diagnosis Date Noted  . Supervision of other normal pregnancy, antepartum 11/12/2018    Past Surgical History:  Procedure Laterality Date  . ARTHROSCOPIC REPAIR ACL    . LAPAROSCOPY N/A 07/18/2018   Procedure: LAPAROSCOPY DIAGNOSTIC,right salpingostomy;  Surgeon: Nadara MustardHarris, Robert P, MD;  Location: [facility] ORS;  Service: Gynecology;  Laterality: N/A;  ectopic supected to be in blood loss    Allergies Chlorhexidine  Social History Social History   Tobacco Use  . Smoking status: Never Smoker  . Smokeless tobacco: Never Used  Substance Use Topics  . Alcohol use: Never    Frequency: Never  . Drug use: Never   Review of Systems Constitutional: Negative for fever. Cardiovascular: Negative for chest pain. Respiratory: Negative for shortness of breath. Gastrointestinal: Positive for abdominal pain Musculoskeletal: Negative for back pain. Skin: Negative for rash. Neurological: Negative for headaches, focal weakness or numbness.  All systems negative/normal/unremarkable except as stated in the HPI  ____________________________________________   PHYSICAL EXAM:  VITAL  SIGNS: ED Triage Vitals  Enc Vitals Group     BP 11/20/18 1443 122/68     Pulse Rate 11/20/18 1443 96     Resp 11/20/18 1443 20     Temp 11/20/18 1443 98.7 F (37.1 C)     Temp Source 11/20/18 1443 Oral     SpO2 11/20/18 1443 98 %     Weight 11/20/18 1443 222 lb (100.7 kg)     Height 11/20/18 1443 5\' 8"  (1.727 m)     Head Circumference --      Peak Flow --      Pain Score 11/20/18 1450 1     Pain Loc --      Pain Edu? --      Excl. in GC? --     Constitutional: Alert and oriented. Well appearing and in no distress. Eyes: Conjunctivae are normal. Normal extraocular movements. Cardiovascular: Normal rate, regular rhythm. No murmurs, rubs, or gallops. Respiratory: Normal respiratory effort without tachypnea nor retractions. Breath sounds are clear and equal bilaterally. No wheezes/rales/rhonchi. Gastrointestinal: Right lower quadrant and right-sided pelvic tenderness is noted, no rebound or guarding.  Normal bowel sounds. Musculoskeletal: Nontender with normal range of motion in extremities. No lower extremity tenderness nor edema. Neurologic:  Normal speech and language. No gross focal neurologic deficits are appreciated.  Skin:  Skin is warm, dry and intact. No rash noted. Psychiatric: Mood and affect are normal. Speech and behavior are normal.  ____________________________________________  ED COURSE:  As part of my medical decision making, I reviewed the following data within the electronic MEDICAL RECORD NUMBER History obtained from family if available, nursing notes, old chart and ekg, as  well as notes from prior ED visits. Patient presented for abdominal pain in early pregnancy, we will assess with labs and imaging as indicated at this time. Clinical Course as of Nov 19 1952  Sat Nov 20, 2018  1814 HCG, Bosie Helper(!): [phone] [JW]    Clinical Course User Index [JW] Earleen Newport, MD   Procedures  Heather Jacobs was evaluated in Emergency Department on  11/20/2018 for the symptoms described in the history of present illness. She was evaluated in the context of the global COVID-19 pandemic, which necessitated consideration that the patient might be at risk for infection with the SARS-CoV-2 virus that causes COVID-19. Institutional protocols and algorithms that pertain to the evaluation of patients at risk for COVID-19 are in a state of rapid change based on information released by regulatory bodies including the CDC and federal and state organizations. These policies and algorithms were followed during the patient's care in the ED.  ____________________________________________   LABS (pertinent positives/negatives)  Labs Reviewed  COMPREHENSIVE METABOLIC PANEL - Abnormal; Notable for the following components:      Result Value   Calcium 8.6 (*)    All other components within normal limits  URINALYSIS, COMPLETE (UACMP) WITH MICROSCOPIC - Abnormal; Notable for the following components:   Color, Urine YELLOW (*)    APPearance HAZY (*)    Bacteria, UA RARE (*)    All other components within normal limits  HCG, QUANTITATIVE, PREGNANCY - Abnormal; Notable for the following components:   hCG, Beta Chain, Quant, S [id number] (*)    All other components within normal limits  LIPASE, BLOOD  CBC    RADIOLOGY Images were viewed by me  Pelvic ultrasound IMPRESSION: 1. Single live intrauterine pregnancy with an estimated gestational age of [redacted] weeks, 5 days based on today's ultrasound. 2. Doppler detected flow to both ovaries. ____________________________________________   DIFFERENTIAL DIAGNOSIS   Round ligament pain, appendicitis, renal colic, UTI, pyelonephritis, scar tissue, ectopic pregnancy  FINAL ASSESSMENT AND PLAN  Abdominal pain in early pregnancy   Plan: The patient had presented for 1 week of abdominal pain in early pregnancy. Patient's labs were reassuring. Patient's imaging revealed a 9-week 5-day pregnancy with no other acute  process identified.  She looks well and has no leukocytosis.  I will advise close outpatient follow-up with her OB/GYN doctor.   Laurence Aly, MD    Note: This note was generated in part or whole with voice recognition software. Voice recognition is usually quite accurate but there are transcription errors that can and very often do occur. I apologize for any typographical errors that were not detected and corrected.     Earleen Newport, MD 11/20/18 [phone]

## 2018-11-23 ENCOUNTER — Ambulatory Visit (INDEPENDENT_AMBULATORY_CARE_PROVIDER_SITE_OTHER): Payer: Medicaid Other

## 2018-11-23 ENCOUNTER — Encounter: Payer: Self-pay | Admitting: Maternal Newborn

## 2018-11-23 ENCOUNTER — Other Ambulatory Visit: Payer: Medicaid Other

## 2018-11-23 ENCOUNTER — Other Ambulatory Visit: Payer: Self-pay

## 2018-11-23 ENCOUNTER — Ambulatory Visit (INDEPENDENT_AMBULATORY_CARE_PROVIDER_SITE_OTHER): Payer: Medicaid Other | Admitting: Maternal Newborn

## 2018-11-23 VITALS — BP 120/80 | Wt 223.0 lb

## 2018-11-23 DIAGNOSIS — Z369 Encounter for antenatal screening, unspecified: Secondary | ICD-10-CM

## 2018-11-23 DIAGNOSIS — Z3481 Encounter for supervision of other normal pregnancy, first trimester: Secondary | ICD-10-CM

## 2018-11-23 DIAGNOSIS — Z3480 Encounter for supervision of other normal pregnancy, unspecified trimester: Secondary | ICD-10-CM

## 2018-11-23 DIAGNOSIS — O3481 Maternal care for other abnormalities of pelvic organs, first trimester: Secondary | ICD-10-CM | POA: Diagnosis not present

## 2018-11-23 DIAGNOSIS — N8311 Corpus luteum cyst of right ovary: Secondary | ICD-10-CM

## 2018-11-23 DIAGNOSIS — Z3A10 10 weeks gestation of pregnancy: Secondary | ICD-10-CM | POA: Diagnosis not present

## 2018-11-23 DIAGNOSIS — Z6833 Body mass index (BMI) 33.0-33.9, adult: Secondary | ICD-10-CM

## 2018-11-23 DIAGNOSIS — Z1379 Encounter for other screening for genetic and chromosomal anomalies: Secondary | ICD-10-CM

## 2018-11-23 DIAGNOSIS — Z3A12 12 weeks gestation of pregnancy: Secondary | ICD-10-CM

## 2018-11-23 LAB — OB RESULTS CONSOLE VARICELLA ZOSTER ANTIBODY, IGG: Varicella: IMMUNE

## 2018-11-23 NOTE — Patient Instructions (Signed)
First Trimester of Pregnancy The first trimester of pregnancy is from week 1 until the end of week 13 (months 1 through 3). A week after a sperm fertilizes an egg, the egg will implant on the wall of the uterus. This embryo will begin to develop into a baby. Genes from you and your partner will form the baby. The female genes will determine whether the baby will be a boy or a girl. At 6-8 weeks, the eyes and face will be formed, and the heartbeat can be seen on ultrasound. At the end of 12 weeks, all the baby's organs will be formed. Now that you are pregnant, you will want to do everything you can to have a healthy baby. Two of the most important things are to get good prenatal care and to follow your health care provider's instructions. Prenatal care is all the medical care you receive before the baby's birth. This care will help prevent, find, and treat any problems during the pregnancy and childbirth. Body changes during your first trimester Your body goes through many changes during pregnancy. The changes vary from woman to woman.  You may gain or lose a couple of pounds at first.  You may feel sick to your stomach (nauseous) and you may throw up (vomit). If the vomiting is uncontrollable, call your health care provider.  You may tire easily.  You may develop headaches that can be relieved by medicines. All medicines should be approved by your health care provider.  You may urinate more often. Painful urination may mean you have a bladder infection.  You may develop heartburn as a result of your pregnancy.  You may develop constipation because certain hormones are causing the muscles that push stool through your intestines to slow down.  You may develop hemorrhoids or swollen veins (varicose veins).  Your breasts may begin to grow larger and become tender. Your nipples may stick out more, and the tissue that surrounds them (areola) may become darker.  Your gums may bleed and may be  sensitive to brushing and flossing.  Dark spots or blotches (chloasma, mask of pregnancy) may develop on your face. This will likely fade after the baby is born.  Your menstrual periods will stop.  You may have a loss of appetite.  You may develop cravings for certain kinds of food.  You may have changes in your emotions from day to day, such as being excited to be pregnant or being concerned that something may go wrong with the pregnancy and baby.  You may have more vivid and strange dreams.  You may have changes in your hair. These can include thickening of your hair, rapid growth, and changes in texture. Some women also have hair loss during or after pregnancy, or hair that feels dry or thin. Your hair will most likely return to normal after your baby is born. What to expect at prenatal visits During a routine prenatal visit:  You will be weighed to make sure you and the baby are growing normally.  Your blood pressure will be taken.  Your abdomen will be measured to track your baby's growth.  The fetal heartbeat will be listened to between weeks 10 and 14 of your pregnancy.  Test results from any previous visits will be discussed. Your health care provider may ask you:  How you are feeling.  If you are feeling the baby move.  If you have had any abnormal symptoms, such as leaking fluid, bleeding, severe headaches, or abdominal   cramping.  If you are using any tobacco products, including cigarettes, chewing tobacco, and electronic cigarettes.  If you have any questions. Other tests that may be performed during your first trimester include:  Blood tests to find your blood type and to check for the presence of any previous infections. The tests will also be used to check for low iron levels (anemia) and protein on red blood cells (Rh antibodies). Depending on your risk factors, or if you previously had diabetes during pregnancy, you may have tests to check for high blood sugar  that affects pregnant women (gestational diabetes).  Urine tests to check for infections, diabetes, or protein in the urine.  An ultrasound to confirm the proper growth and development of the baby.  Fetal screens for spinal cord problems (spina bifida) and Down syndrome.  HIV (human immunodeficiency virus) testing. Routine prenatal testing includes screening for HIV, unless you choose not to have this test.  You may need other tests to make sure you and the baby are doing well. Follow these instructions at home: Medicines  Follow your health care provider's instructions regarding medicine use. Specific medicines may be either safe or unsafe to take during pregnancy.  Take a prenatal vitamin that contains at least 600 micrograms (mcg) of folic acid.  If you develop constipation, try taking a stool softener if your health care provider approves. Eating and drinking   Eat a balanced diet that includes fresh fruits and vegetables, whole grains, good sources of protein such as meat, eggs, or tofu, and low-fat dairy. Your health care provider will help you determine the amount of weight gain that is right for you.  Avoid raw meat and uncooked cheese. These carry germs that can cause birth defects in the baby.  Eating four or five small meals rather than three large meals a day may help relieve nausea and vomiting. If you start to feel nauseous, eating a few soda crackers can be helpful. Drinking liquids between meals, instead of during meals, also seems to help ease nausea and vomiting.  Limit foods that are high in fat and processed sugars, such as fried and sweet foods.  To prevent constipation: ? Eat foods that are high in fiber, such as fresh fruits and vegetables, whole grains, and beans. ? Drink enough fluid to keep your urine clear or pale yellow. Activity  Exercise only as directed by your health care provider. Most women can continue their usual exercise routine during  pregnancy. Try to exercise for 30 minutes at least 5 days a week. Exercising will help you: ? Control your weight. ? Stay in shape. ? Be prepared for labor and delivery.  Experiencing pain or cramping in the lower abdomen or lower back is a good sign that you should stop exercising. Check with your health care provider before continuing with normal exercises.  Try to avoid standing for long periods of time. Move your legs often if you must stand in one place for a long time.  Avoid heavy lifting.  Wear low-heeled shoes and practice good posture.  You may continue to have sex unless your health care provider tells you not to. Relieving pain and discomfort  Wear a good support bra to relieve breast tenderness.  Take warm sitz baths to soothe any pain or discomfort caused by hemorrhoids. Use hemorrhoid cream if your health care provider approves.  Rest with your legs elevated if you have leg cramps or low back pain.  If you develop varicose veins in   your legs, wear support hose. Elevate your feet for 15 minutes, 3-4 times a day. Limit salt in your diet. Prenatal care  Schedule your prenatal visits by the twelfth week of pregnancy. They are usually scheduled monthly at first, then more often in the last 2 months before delivery.  Write down your questions. Take them to your prenatal visits.  Keep all your prenatal visits as told by your health care provider. This is important. Safety  Wear your seat belt at all times when driving.  Make a list of emergency phone numbers, including numbers for family, friends, the hospital, and police and fire departments. General instructions  Ask your health care provider for a referral to a local prenatal education class. Begin classes no later than the beginning of month 6 of your pregnancy.  Ask for help if you have counseling or nutritional needs during pregnancy. Your health care provider can offer advice or refer you to specialists for help  with various needs.  Do not use hot tubs, steam rooms, or saunas.  Do not douche or use tampons or scented sanitary pads.  Do not cross your legs for long periods of time.  Avoid cat litter boxes and soil used by cats. These carry germs that can cause birth defects in the baby and possibly loss of the fetus by miscarriage or stillbirth.  Avoid all smoking, herbs, alcohol, and medicines not prescribed by your health care provider. Chemicals in these products affect the formation and growth of the baby.  Do not use any products that contain nicotine or tobacco, such as cigarettes and e-cigarettes. If you need help quitting, ask your health care provider. You may receive counseling support and other resources to help you quit.  Schedule a dentist appointment. At home, brush your teeth with a soft toothbrush and be gentle when you floss. Contact a health care provider if:  You have dizziness.  You have mild pelvic cramps, pelvic pressure, or nagging pain in the abdominal area.  You have persistent nausea, vomiting, or diarrhea.  You have a bad smelling vaginal discharge.  You have pain when you urinate.  You notice increased swelling in your face, hands, legs, or ankles.  You are exposed to fifth disease or chickenpox.  You are exposed to German measles (rubella) and have never had it. Get help right away if:  You have a fever.  You are leaking fluid from your vagina.  You have spotting or bleeding from your vagina.  You have severe abdominal cramping or pain.  You have rapid weight gain or loss.  You vomit blood or material that looks like coffee grounds.  You develop a severe headache.  You have shortness of breath.  You have any kind of trauma, such as from a fall or a car accident. Summary  The first trimester of pregnancy is from week 1 until the end of week 13 (months 1 through 3).  Your body goes through many changes during pregnancy. The changes vary from  woman to woman.  You will have routine prenatal visits. During those visits, your health care provider will examine you, discuss any test results you may have, and talk with you about how you are feeling. This information is not intended to replace advice given to you by your health care provider. Make sure you discuss any questions you have with your health care provider. Document Released: 04/08/2001 Document Revised: 03/27/2017 Document Reviewed: 03/26/2016 Elsevier Patient Education  2020 [employer]  

## 2018-11-23 NOTE — Progress Notes (Signed)
    Routine Prenatal Care Visit  Subjective  Heather Jacobs is a 23 y.o. G3P1011 at [redacted]w[redacted]d being seen today for ongoing prenatal care.  She is currently monitored for the following issues for this low-risk pregnancy and has Supervision of other normal pregnancy, antepartum on their problem list.  ----------------------------------------------------------------------------------- Patient reports nausea is improving. Bonjesta made her too sleepy, so she was unable to continue taking it. Peppermints are helping with nausea. Contractions: Not present. Vag. Bleeding: None.  Movement: Absent ----------------------------------------------------------------------------------- The following portions of the patient's history were reviewed and updated as appropriate: allergies, current medications, past family history, past medical history, past social history, past surgical history and problem list. Problem list updated.   Objective  Blood pressure 120/80, weight 223 lb (101.2 kg), last menstrual period 08/30/2018. Pregravid weight 222 lb (100.7 kg) Total Weight Gain 1 lb (0.454 kg)  Fetal Status: Fetal Heart Rate (bpm): 162 ([place])   Movement: Absent     General:  Alert, oriented and cooperative. Patient is in no acute distress.  Skin: Skin is warm and dry. No rash noted.   Cardiovascular: Normal heart rate noted  Respiratory: Normal respiratory effort, no problems with respiration noted  Abdomen: Soft, gravid, appropriate for gestational age. Pain/Pressure: Absent     Pelvic:  Cervical exam deferred        Extremities: Normal range of motion.  Edema: None  Mental Status: Normal mood and affect. Normal behavior. Normal judgment and thought content.     Assessment   23 y.o. G3P1011 at [redacted]w[redacted]d, EDD 06/06/2019 by Last Menstrual Period presenting for a routine prenatal visit.  Plan   pregnancy Problems (from 11/12/18 to present)    Problem Noted Resolved   Supervision of other normal pregnancy,  antepartum 11/12/2018 by Rexene Agent, CNM No   Overview Signed 11/12/2018  3:17 PM by Rexene Agent, [place] Prenatal Labs  Dating  Blood type: --/--/B POS Performed at [facility], [address] [place], [place] [address]  [phone])   Genetic Screen 1 Screen:    AFP:     Quad:     NIPS: Antibody:   Anatomic [place]  Rubella:   Varicella:    GTT Early:               Third trimester:  RPR:     Rhogam  HBsAg:     TDaP vaccine                       Flu Shot: HIV:     Baby Food                                GBS:   Contraception  Pap:  CBB     CS/VBAC    Support Person               Dating scan shows a singleton IUP at 10w 3d gestation, FHR 162 bpm. Dating changed based on ACOG Guidelines, new EDD is 06/18/2019. Results and dating reviewed with patient.  GTT, NOB labs, and MaternT21 collected today.  Please refer to After Visit Summary for other counseling recommendations.   Return in about 4 weeks (around 12/21/2018) for [place].  Avel Sensor, CNM 11/23/2018  3:30 PM

## 2018-11-25 LAB — RPR+RH+ABO+RUB AB+AB SCR+CB...
Antibody Screen: NEGATIVE
HIV Screen 4th Generation wRfx: NONREACTIVE
Hematocrit: 36.1 % (ref 34.0–46.6)
Hemoglobin: 12 g/dL (ref 11.1–15.9)
Hepatitis B Surface Ag: NEGATIVE
MCH: 27.6 pg (ref 26.6–33.0)
MCHC: 33.2 g/dL (ref 31.5–35.7)
MCV: 83 fL (ref 79–97)
Platelets: 184 10*3/uL (ref 150–450)
RBC: 4.34 x10E6/uL (ref 3.77–5.28)
RDW: 15 % (ref 11.7–15.4)
RPR Ser Ql: NONREACTIVE
Rh Factor: POSITIVE
Rubella Antibodies, IGG: <0.9 index — ABNORMAL LOW (ref >0.99)
Varicella zoster IgG: 211 index (ref >165)
WBC: 5.8 10*3/uL (ref 3.4–10.8)

## 2018-11-25 LAB — GLUCOSE, 1 HOUR GESTATIONAL: Gestational Diabetes Screen: 83 mg/dL (ref 65–139)

## 2018-11-27 LAB — MATERNIT 21 PLUS CORE, BLOOD
Fetal Fraction: 4
Result (T21): NEGATIVE
Trisomy 13 (Patau syndrome): NEGATIVE
Trisomy 18 (Edwards syndrome): NEGATIVE
Trisomy 21 (Down syndrome): NEGATIVE

## 2018-12-17 ENCOUNTER — Telehealth: Payer: Self-pay | Admitting: Obstetrics & Gynecology

## 2018-12-17 NOTE — Telephone Encounter (Signed)
Left voicemail for patient to know appointment is via phone

## 2018-12-17 NOTE — Telephone Encounter (Signed)
-----   Message from Gae Dry, MD sent at 12/17/2018  9:19 AM EDT ----- Regarding: change ROB to TELE on 8/25

## 2018-12-19 ENCOUNTER — Other Ambulatory Visit: Payer: Self-pay

## 2018-12-19 ENCOUNTER — Emergency Department: Payer: Medicaid Other

## 2018-12-19 ENCOUNTER — Emergency Department
Admission: EM | Admit: 2018-12-19 | Discharge: 2018-12-19 | Disposition: A | Discharge status: Home / Self Care | Payer: Medicaid Other | Attending: Student in an Organized Health Care Education/Training Program | Admitting: Student in an Organized Health Care Education/Training Program

## 2018-12-19 ENCOUNTER — Encounter: Payer: Self-pay | Admitting: Emergency Medicine

## 2018-12-19 DIAGNOSIS — R519 Headache, unspecified: Secondary | ICD-10-CM

## 2018-12-19 DIAGNOSIS — R0789 Other chest pain: Secondary | ICD-10-CM | POA: Diagnosis present

## 2018-12-19 DIAGNOSIS — Z79899 Other long term (current) drug therapy: Secondary | ICD-10-CM | POA: Diagnosis not present

## 2018-12-19 DIAGNOSIS — R0602 Shortness of breath: Secondary | ICD-10-CM | POA: Insufficient documentation

## 2018-12-19 DIAGNOSIS — R51 Headache: Secondary | ICD-10-CM | POA: Insufficient documentation

## 2018-12-19 DIAGNOSIS — R079 Chest pain, unspecified: Secondary | ICD-10-CM

## 2018-12-19 LAB — CBC WITH DIFFERENTIAL/PLATELET
Abs Immature Granulocytes: 0.03 10*3/uL (ref 0.00–0.07)
Basophils Absolute: 0 10*3/uL (ref 0.0–0.1)
Basophils Relative: 0 %
Eosinophils Absolute: 0 10*3/uL (ref 0.0–0.5)
Eosinophils Relative: 1 %
HCT: 34.9 % — ABNORMAL LOW (ref 36.0–46.0)
Hemoglobin: 12.1 g/dL (ref 12.0–15.0)
Immature Granulocytes: 0 %
Lymphocytes Relative: 22 %
Lymphs Abs: 1.6 10*3/uL (ref 0.7–4.0)
MCH: 29.7 pg (ref 26.0–34.0)
MCHC: 34.7 g/dL (ref 30.0–36.0)
MCV: 85.5 fL (ref 80.0–100.0)
Monocytes Absolute: 0.4 10*3/uL (ref 0.1–1.0)
Monocytes Relative: 6 %
Neutro Abs: 5.4 10*3/uL (ref 1.7–7.7)
Neutrophils Relative %: 71 %
Platelets: 172 10*3/uL (ref 150–400)
RBC: 4.08 MIL/uL (ref 3.87–5.11)
RDW: 13.9 % (ref 11.5–15.5)
WBC: 7.5 10*3/uL (ref 4.0–10.5)
nRBC: 0 % (ref 0.0–0.2)

## 2018-12-19 LAB — COMPREHENSIVE METABOLIC PANEL
ALT: 14 U/L (ref 0–44)
AST: 18 U/L (ref 15–41)
Albumin: 3.6 g/dL (ref 3.5–5.0)
Alkaline Phosphatase: 52 U/L (ref 38–126)
Anion gap: 9 (ref 5–15)
BUN: 8 mg/dL (ref 6–20)
CO2: 22 mmol/L (ref 22–32)
Calcium: 8.9 mg/dL (ref 8.9–10.3)
Chloride: 106 mmol/L (ref 98–111)
Creatinine, Ser: 0.53 mg/dL (ref 0.44–1.00)
GFR calc Af Amer: >60 mL/min (ref >60)
GFR calc non Af Amer: >60 mL/min (ref >60)
Glucose, Bld: 85 mg/dL (ref 70–99)
Potassium: 3.4 mmol/L — ABNORMAL LOW (ref 3.5–5.1)
Sodium: 137 mmol/L (ref 135–145)
Total Bilirubin: 0.3 mg/dL (ref 0.3–1.2)
Total Protein: 6.9 g/dL (ref 6.5–8.1)

## 2018-12-19 LAB — URINALYSIS, COMPLETE (UACMP) WITH MICROSCOPIC
Bilirubin Urine: NEGATIVE
Glucose, UA: NEGATIVE mg/dL
Hgb urine dipstick: NEGATIVE
Ketones, ur: NEGATIVE mg/dL
Leukocytes,Ua: NEGATIVE
Nitrite: NEGATIVE
Protein, ur: NEGATIVE mg/dL
Specific Gravity, Urine: 1.009 (ref 1.005–1.030)
pH: 6 (ref 5.0–8.0)

## 2018-12-19 LAB — FIBRIN DERIVATIVES D-DIMER (ARMC ONLY): Fibrin derivatives D-dimer (ARMC): 301.43 ng/mL (FEU) (ref 0.00–499.00)

## 2018-12-19 MED ORDER — CEPHALEXIN 500 MG PO CAPS: 500.0000 mg | ORAL_CAPSULE | Freq: Three times a day (TID) | ORAL | 0 refills | Status: AC

## 2018-12-19 MED ORDER — SODIUM CHLORIDE 0.9 % IV BOLUS
500.0000 mL | Freq: Once | INTRAVENOUS | Status: AC
  Administered 2018-12-19: 08:00:00 500 mL via INTRAVENOUS

## 2018-12-19 NOTE — Discharge Instructions (Addendum)
Follow up with Ob/gyn.  Return for any additional questions or concerns.

## 2018-12-19 NOTE — ED Provider Notes (Signed)
[facility] Emergency Department Provider Note    First MD Initiated Contact with Patient 12/19/18 [phone]     (approximate)  I have reviewed the triage vital signs and the nursing notes.   HISTORY  Chief Complaint Chest Pain, Shortness of Breath, and Headache    HPI Heather Jacobs is a 23 y.o. female presents the ER for evaluation of intermittent brief episodes of right-sided chest pain for the past several weeks.  States she is also been having intermittent brief headaches lasting roughly an hour.  These headaches are associated with blurred vision and lightheadedness.  Has not had any fevers.  No cough.  She does not have any chest pain or shortness of breath at this time.  She came to the ER because she called her OB/GYN helpline and was directed to the ER for evaluation.  She does not have any nausea or vomiting.  No abdominal pain.  No previous history of DVT.    Past Medical History:  Diagnosis Date  . Ectopic pregnancy   . Ruptured right tubal ectopic pregnancy causing hemoperitoneum 07/18/2018   Family History  Problem Relation Age of Onset  . Diabetes Mellitus I Father   . Heart defect Father    Past Surgical History:  Procedure Laterality Date  . ARTHROSCOPIC REPAIR ACL    . LAPAROSCOPY N/A 07/18/2018   Procedure: LAPAROSCOPY DIAGNOSTIC,right salpingostomy;  Surgeon: Nadara MustardHarris, Robert P, MD;  Location: [facility] ORS;  Service: Gynecology;  Laterality: N/A;  ectopic supected to be in blood loss   Patient Active Problem List   Diagnosis Date Noted  . Supervision of other normal pregnancy, antepartum 11/12/2018      Prior to Admission medications   Medication Sig Start Date End Date Taking? Authorizing Provider  acetaminophen (TYLENOL) 325 MG tablet Take 650 mg by mouth every 6 (six) hours as needed.    [provider]  prenatal vitamin w/FE, FA (PRENATAL 1 + 1) 27-1 MG TABS tablet Take 1 tablet by mouth daily at 12 noon.    [provider]    Allergies Chlorhexidine    Social History Social History   Tobacco Use  . Smoking status: Never Smoker  . Smokeless tobacco: Never Used  Substance Use Topics  . Alcohol use: Never    Frequency: Never  . Drug use: Never    Review of Systems Patient denies headaches, rhinorrhea, blurry vision, numbness, shortness of breath, chest pain, edema, cough, abdominal pain, nausea, vomiting, diarrhea, dysuria, fevers, rashes or hallucinations unless otherwise stated above in HPI. ____________________________________________   PHYSICAL EXAM:  VITAL SIGNS: Vitals:   12/19/18 0800 12/19/18 0830  BP: 132/64 125/61  Pulse: 70 71  Resp: 16 18  Temp:    SpO2: 100% 100%    Constitutional: Alert and oriented.  Eyes: Conjunctivae are normal.  Head: Atraumatic. Nose: No congestion/rhinnorhea. Mouth/Throat: Mucous membranes are moist.   Neck: No stridor. Painless ROM.  Cardiovascular: Normal rate, regular rhythm. Grossly normal heart sounds.  Good peripheral circulation. Respiratory: Normal respiratory effort.  No retractions. Lungs CTAB. Gastrointestinal: Soft and nontender. No distention. No abdominal bruits. No CVA tenderness. Genitourinary: deferred Musculoskeletal: No lower extremity tenderness nor edema.  No joint effusions. Neurologic:  Normal speech and language. No gross focal neurologic deficits are appreciated. No facial droop Skin:  Skin is warm, dry and intact. No rash noted. Psychiatric: Mood and affect are normal. Speech and behavior are normal.  ____________________________________________   LABS (all labs ordered are listed,  but only abnormal results are displayed)  Results for orders placed or performed during the hospital encounter of 12/19/18 (from the past 24 hour(s))  CBC with Differential/Platelet     Status: Abnormal   Collection Time: 12/19/18  7:45 AM  Result Value Ref Range   WBC 7.5 4.0 - 10.5 K/uL   RBC 4.08 3.87 - 5.11 MIL/uL    Hemoglobin 12.1 12.0 - 15.0 g/dL   HCT 56.234.9 (L) 13.036.0 - 86.546.0 %   MCV 85.5 80.0 - 100.0 fL   MCH 29.7 26.0 - 34.0 pg   MCHC 34.7 30.0 - 36.0 g/dL   RDW 78.413.9 69.611.5 - 29.515.5 %   Platelets 172 150 - 400 K/uL   nRBC 0.0 0.0 - 0.2 %   Neutrophils Relative % 71 %   Neutro Abs 5.4 1.7 - 7.7 K/uL   Lymphocytes Relative 22 %   Lymphs Abs 1.6 0.7 - 4.0 K/uL   Monocytes Relative 6 %   Monocytes Absolute 0.4 0.1 - 1.0 K/uL   Eosinophils Relative 1 %   Eosinophils Absolute 0.0 0.0 - 0.5 K/uL   Basophils Relative 0 %   Basophils Absolute 0.0 0.0 - 0.1 K/uL   Immature Granulocytes 0 %   Abs Immature Granulocytes 0.03 0.00 - 0.07 K/uL  Comprehensive metabolic panel     Status: Abnormal   Collection Time: 12/19/18  7:45 AM  Result Value Ref Range   Sodium 137 135 - 145 mmol/L   Potassium 3.4 (L) 3.5 - 5.1 mmol/L   Chloride 106 98 - 111 mmol/L   CO2 22 22 - 32 mmol/L   Glucose, Bld 85 70 - 99 mg/dL   BUN 8 6 - 20 mg/dL   Creatinine, Ser 2.840.53 0.44 - 1.00 mg/dL   Calcium 8.9 8.9 - 13.210.3 mg/dL   Total Protein 6.9 6.5 - 8.1 g/dL   Albumin 3.6 3.5 - 5.0 g/dL   AST 18 15 - 41 U/L   ALT 14 0 - 44 U/L   Alkaline Phosphatase 52 38 - 126 U/L   Total Bilirubin 0.3 0.3 - 1.2 mg/dL   GFR calc non Af Amer >60 >60 mL/min   GFR calc Af Amer >60 >60 mL/min   Anion gap 9 5 - 15  Urinalysis, Complete w Microscopic     Status: Abnormal   Collection Time: 12/19/18  7:45 AM  Result Value Ref Range   Color, Urine STRAW (A) YELLOW   APPearance CLEAR (A) CLEAR   Specific Gravity, Urine 1.009 1.005 - 1.030   pH 6.0 5.0 - 8.0   Glucose, UA NEGATIVE NEGATIVE mg/dL   Hgb urine dipstick NEGATIVE NEGATIVE   Bilirubin Urine NEGATIVE NEGATIVE   Ketones, ur NEGATIVE NEGATIVE mg/dL   Protein, ur NEGATIVE NEGATIVE mg/dL   Nitrite NEGATIVE NEGATIVE   Leukocytes,Ua NEGATIVE NEGATIVE   RBC / HPF 0-5 0 - 5 RBC/hpf   WBC, UA 0-5 0 - 5 WBC/hpf   Bacteria, UA FEW (A) NONE SEEN   Squamous Epithelial / LPF 0-5 0 - 5   Mucus  PRESENT   Fibrin derivatives D-Dimer ([facility] only)     Status: None   Collection Time: 12/19/18  7:45 AM  Result Value Ref Range   Fibrin derivatives D-dimer ([facility]) 301.43 0.00 - 499.00 ng/mL (FEU)   ____________________________________________  EKG My review and personal interpretation at Time: 7:19   Indication: chest pain  Rate: 70  Rhythm: sinus Axis: normal Other: normal, no stemi ____________________________________________  RADIOLOGY  I personally reviewed all radiographic images ordered to evaluate for the above acute complaints and reviewed radiology reports and findings.  These findings were personally discussed with the patient.  Please see medical record for radiology report.  ____________________________________________   PROCEDURES  Procedure(s) performed:  Procedures    Critical Care performed: no ____________________________________________   INITIAL IMPRESSION / ASSESSMENT AND PLAN / ED COURSE  Pertinent labs & imaging results that were available during my care of the patient were reviewed by me and considered in my medical decision making (see chart for details).   DDX: bronchitis, pna, cholelithiasis, cholecystitis, migraine, tension, cvt  Heather Jacobs is a 23 y.o. who presents to the ED with intermittent symptoms over the past week as described above.  She is afebrile hemodynamically stable.  Mildly elevated blood pressure but nontoxic-appearing.  Reassuring neuro exam.  She is without any tachypnea, tachycardia or hypoxia.  Does not have any chest pain or pleuritic pain at this time.  Have a lower suspicion for PE.  Doubt pneumothorax or pneumonia.  Does not seem consistent with CVT.  Blood work will be ordered will give IV fluids.  EKG does not show any evidence of dysrhythmia.  Clinical Course as of Dec 18 857  Sun Dec 19, 2018  0859 Patient reassessed.  Remains hemodynamically stable well-appearing in no acute distress.  She is not having any  symptoms at this time.  Will send for urine culture.  Will cover with antibiotics that she does have few bacteria.  Patient stable and appropriate for outpatient follow-up.   [PR]    Clinical Course User Index [PR] Merlyn Lot, MD    The patient was evaluated in Emergency Department today for the symptoms described in the history of present illness. He/she was evaluated in the context of the global COVID-19 pandemic, which necessitated consideration that the patient might be at risk for infection with the SARS-CoV-2 virus that causes COVID-19. Institutional protocols and algorithms that pertain to the evaluation of patients at risk for COVID-19 are in a state of rapid change based on information released by regulatory bodies including the CDC and federal and state organizations. These policies and algorithms were followed during the patient's care in the ED.  As part of my medical decision making, I reviewed the following data within the [place] notes reviewed and incorporated, Labs reviewed, notes from prior ED visits and [place] Controlled Substance Database   ____________________________________________   FINAL CLINICAL IMPRESSION(S) / ED DIAGNOSES  Final diagnoses:  Chest pain, unspecified type  Nonintractable episodic headache, unspecified headache type      NEW MEDICATIONS STARTED DURING THIS VISIT:  New Prescriptions   No medications on file     Note:  This document was prepared using Dragon voice recognition software and may include unintentional dictation errors.    Merlyn Lot, MD 12/19/18 0900

## 2018-12-19 NOTE — ED Triage Notes (Signed)
Pt arrived via POV with reports of chest pain and shortness of breath x 1 week, pt states she gets a right sided chest pain that radiates to the right shoulder, pt also states she gets a headache that causes blurred vision and spots x 1 week.  Pt is 14 weeks and 2 D pregnant. This is the patient's 3rd pregnancy. G3P1

## 2018-12-19 NOTE — ED Notes (Signed)
Patient transported to CT 

## 2018-12-19 NOTE — ED Notes (Signed)
Pt denies any chest pain or SHOB at this time - she reports the last episode was at [place] only comes with chest pain - she has been having the chest pain and SHOB intermittently for the last week

## 2018-12-21 ENCOUNTER — Encounter: Payer: Self-pay | Admitting: Obstetrics & Gynecology

## 2018-12-21 ENCOUNTER — Other Ambulatory Visit: Payer: Self-pay

## 2018-12-21 ENCOUNTER — Ambulatory Visit (INDEPENDENT_AMBULATORY_CARE_PROVIDER_SITE_OTHER): Payer: Medicaid Other | Admitting: Obstetrics & Gynecology

## 2018-12-21 DIAGNOSIS — Z3A14 14 weeks gestation of pregnancy: Secondary | ICD-10-CM

## 2018-12-21 DIAGNOSIS — Z3689 Encounter for other specified antenatal screening: Secondary | ICD-10-CM

## 2018-12-21 DIAGNOSIS — Z3480 Encounter for supervision of other normal pregnancy, unspecified trimester: Secondary | ICD-10-CM

## 2018-12-21 DIAGNOSIS — O99212 Obesity complicating pregnancy, second trimester: Secondary | ICD-10-CM | POA: Diagnosis not present

## 2018-12-21 MED ORDER — ONDANSETRON 4 MG PO TBDP: 4.0000 mg | ORAL_TABLET | Freq: Four times a day (QID) | ORAL | 0 refills | Status: DC | PRN

## 2018-12-21 NOTE — Progress Notes (Signed)
Virtual Visit via Telephone Note  I connected with patient on 12/21/18 at  1:30 PM EDT by telephone and verified that I am speaking with the correct person using two identifiers.   I discussed the limitations, risks, security and privacy concerns of performing an evaluation and management service by telephone and the availability of in person appointments. I also discussed with the patient that there may be a patient responsible charge related to this service. The patient expressed understanding and agreed to proceed.  The patient was at home I spoke with the patient from my  office  Heather Jacobs is a 23 y.o. G3P1011 at [redacted]w[redacted]d being seen today for ongoing prenatal care.  She is currently monitored for the following issues for this low-risk pregnancy and has Supervision of other normal pregnancy, antepartum on their problem list.  ----------------------------------------------------------------------------------- Patient reports no complaints.  Still having nausea daily.  Bonjesta- side effects.  No other prescription meds Denies pain, VB, leaking of fluid.  ----------------------------------------------------------------------------------- The following portions of the patient's history were reviewed and updated as appropriate: allergies, current medications, past family history, past medical history, past social history, past surgical history and problem list. Problem list updated.   Objective  Last menstrual period 08/30/2018. Pregravid weight 222 lb (100.7 kg) Total Weight Gain 1 lb (0.454 kg)  Physical Exam could not be performed. Because of the COVID-19 outbreak this visit was performed over the phone and not in person.   Assessment   23 y.o. G3P1011 at [redacted]w[redacted]d by  06/18/2019, by Ultrasound presenting for routine prenatal visit  PNV [place] nv  Plan   pregnancy Problems (from 11/12/18 to present)    Problem Noted Resolved   Supervision of other normal pregnancy, antepartum 11/12/2018  by Rexene Agent, CNM No   Overview Addendum 12/21/2018  1:51 PM by Gae Dry, MD    Clinic [facility] Prenatal Labs  Dating [place] Blood type: B/Positive/-- (07/28 1505)   Genetic Screen  NIPS: nml XY Antibody:Negative (07/28 1505)  Anatomic [place]  nv Rubella: <0.90 (07/28 1505) Varicella:    GTT   Third trimester:  RPR: Non Reactive (07/28 1505)   Rhogam na HBsAg: Negative (07/28 1505)   TDaP vaccine    later     Flu Shot: nv HIV: Non Reactive (07/28 1505)   Baby Food    To discuss                            GBS: p  Contraception    To discuss [id number]  CBB  no   CS/VBAC n/a   Support Person               Gestational age appropriate obstetric precautions including but not limited to vaginal bleeding, contractions, leaking of fluid and fetal movement were reviewed in detail with the patient.    Follow Up Instructions: 5 weeks w anat [place]   I discussed the assessment and treatment plan with the patient. The patient was provided an opportunity to ask questions and all were answered. The patient agreed with the plan and demonstrated an understanding of the instructions.   The patient was advised to call back or seek an in-person evaluation if the symptoms worsen or if the condition fails to improve as anticipated.  I provided 7 minutes of non-face-to-face time during this encounter.  Return in about 5 weeks (around 01/25/2019) for ROB w [place] ANat.  Barnett Applebaum, MD [facility], [facility] 12/21/2018 1:54 PM

## 2019-01-25 ENCOUNTER — Ambulatory Visit (INDEPENDENT_AMBULATORY_CARE_PROVIDER_SITE_OTHER): Payer: Medicaid Other | Admitting: Maternal Newborn

## 2019-01-25 ENCOUNTER — Ambulatory Visit (INDEPENDENT_AMBULATORY_CARE_PROVIDER_SITE_OTHER): Payer: Medicaid Other

## 2019-01-25 ENCOUNTER — Encounter: Payer: Self-pay | Admitting: Maternal Newborn

## 2019-01-25 ENCOUNTER — Other Ambulatory Visit: Payer: Self-pay

## 2019-01-25 VITALS — BP 124/78 | Wt 230.0 lb

## 2019-01-25 DIAGNOSIS — Z3480 Encounter for supervision of other normal pregnancy, unspecified trimester: Secondary | ICD-10-CM

## 2019-01-25 DIAGNOSIS — Z3A19 19 weeks gestation of pregnancy: Secondary | ICD-10-CM

## 2019-01-25 DIAGNOSIS — Z3689 Encounter for other specified antenatal screening: Secondary | ICD-10-CM

## 2019-01-25 DIAGNOSIS — O9989 Other specified diseases and conditions complicating pregnancy, childbirth and the puerperium: Secondary | ICD-10-CM

## 2019-01-25 DIAGNOSIS — R079 Chest pain, unspecified: Secondary | ICD-10-CM

## 2019-01-25 DIAGNOSIS — Z363 Encounter for antenatal screening for malformations: Secondary | ICD-10-CM

## 2019-01-25 LAB — POCT URINALYSIS DIPSTICK OB
Glucose, UA: NEGATIVE
POC,PROTEIN,UA: NEGATIVE

## 2019-01-25 NOTE — Patient Instructions (Signed)

## 2019-01-25 NOTE — Progress Notes (Signed)
Some HA's and chest pain.

## 2019-01-25 NOTE — Progress Notes (Signed)
    Routine Prenatal Care Visit  Subjective  Heather Jacobs is a 23 y.o. G3P1011 at [redacted]w[redacted]d being seen today for ongoing prenatal care.  She is currently monitored for the following issues for this low-risk pregnancy and has Supervision of other normal pregnancy, antepartum on their problem list.  ----------------------------------------------------------------------------------- Patient reports ongoing episodes of intermittent chest pain with headaches; frequency has increased as pregnancy has progressed and is now 5-7 days per week. Also having Braxton Hicks contractions, cramping, and back aches.   Contractions: Not present. Vag. Bleeding: None.  Movement: Present. No leaking of fluid.  ----------------------------------------------------------------------------------- The following portions of the patient's history were reviewed and updated as appropriate: allergies, current medications, past family history, past medical history, past social history, past surgical history and problem list. Problem list updated.   Objective  Blood pressure 124/78, weight 230 lb (104.3 kg), last menstrual period 08/30/2018. Pregravid weight 222 lb (100.7 kg) Total Weight Gain 8 lb (3.629 kg) Urinalysis: Urine dipstick shows negative for glucose, protein. Fetal Status: Fetal Heart Rate (bpm): 144 ([place])   Movement: Present     General:  Alert, oriented and cooperative. Patient is in no acute distress.  Skin: Skin is warm and dry. No rash noted.   Cardiovascular: Normal heart rate noted  Respiratory: Normal respiratory effort, no problems with respiration noted  Abdomen: Soft, gravid, appropriate for gestational age. Pain/Pressure: Absent     Pelvic:  Cervical exam deferred        Extremities: Normal range of motion.  Edema: None  Mental Status: Normal mood and affect. Normal behavior. Normal judgment and thought content.     Assessment   23 y.o. G3P1011 at [redacted]w[redacted]d, EDD 06/18/2019 by Ultrasound presenting  for a routine prenatal visit.  Plan   pregnancy Problems (from 11/12/18 to present)    Problem Noted Resolved   Supervision of other normal pregnancy, antepartum 11/12/2018 by Rexene Agent, CNM No   Overview Addendum 12/21/2018  1:51 PM by Gae Dry, MD    Clinic [facility] Prenatal Labs  Dating [place] Blood type: B/Positive/-- (07/28 1505)   Genetic Screen  NIPS: nml XY Antibody:Negative (07/28 1505)  Anatomic [place]  Rubella: <0.90 (07/28 1505) Varicella:    GTT   Third trimester:  RPR: Non Reactive (07/28 1505)   Rhogam na HBsAg: Negative (07/28 1505)   TDaP vaccine         Flu Shot: HIV: Non Reactive (07/28 1505)   Baby Food                                GBS:   Contraception  [id number]:[id number]  CBB  no   CS/VBAC n/a   Support Person               Anatomy scan complete and normal today. Discussed results with patient and partner.   Referral to cardiology for continued intermittent chest pain, did have a normal EKG in the ED on 12/19/2018.  Discussed comfort measures for uterine irritability and backaches.  Gestational age appropriate precautions reviewed.  Please refer to After Visit Summary for other counseling recommendations.   Return in about 4 weeks (around 02/22/2019) for [place] telephone.  Avel Sensor, CNM 01/25/2019  2:38 PM

## 2019-01-26 ENCOUNTER — Ambulatory Visit (INDEPENDENT_AMBULATORY_CARE_PROVIDER_SITE_OTHER): Payer: Medicaid Other | Admitting: Internal Medicine

## 2019-01-26 ENCOUNTER — Encounter: Payer: Self-pay | Admitting: Internal Medicine

## 2019-01-26 ENCOUNTER — Ambulatory Visit (INDEPENDENT_AMBULATORY_CARE_PROVIDER_SITE_OTHER): Payer: Medicaid Other

## 2019-01-26 VITALS — BP 114/60 | HR 94 | Ht 68.5 in | Wt 228.8 lb

## 2019-01-26 DIAGNOSIS — R0789 Other chest pain: Secondary | ICD-10-CM

## 2019-01-26 DIAGNOSIS — R002 Palpitations: Secondary | ICD-10-CM | POA: Diagnosis not present

## 2019-01-26 DIAGNOSIS — R079 Chest pain, unspecified: Secondary | ICD-10-CM

## 2019-01-26 NOTE — Progress Notes (Signed)
New Outpatient Visit Date: 01/26/2019  Referring Provider: Rexene Agent, CNM [address] [place],  [place] [address]  Chief Complaint: Chest pain  HPI:  Heather Jacobs is a 23 y.o. female who is being seen today for the evaluation of chest pain at the request of Heather Jacobs. She has a history of ectopic pregnancy and is currently pregnant ([redacted]w[redacted]d).  She had a routine prenatal visit yesterday and noted increasing chest pain and headaches.  She was seen in the [facility] emergency department in late August for the same complaints.  Blood pressure was mildly elevated.  Laboratory work-up was unrevealing, including negative d-dimer.  EKG from that visit is not available for review.  Heather Jacobs reports that she first developed chest pain about 5 weeks ago.  The pain is sharp and shooting in quality and can be on either the left or right side of her chest.  The pain lasts 30 seconds to a few minutes.  It happens daily, sometimes multiple times a day and is often associated with a headache.  She also reports associated palpitations, lightheadedness, and shortness of breath.  She did not experience this during her prior pregnancies.  Symptoms have been relative stable since they began 5 weeks ago.  She has tried using acetaminophen without relief.  There are no clear exacerbating or alleviating factors; the pain is not consistently exertional, coming on randomly at rest or when she is active.  --------------------------------------------------------------------------------------------------  Cardiovascular History & Procedures: Cardiovascular Problems:  Chest pain  Risk Factors:  None  Cath/PCI:  None  CV Surgery:  None  EP Procedures and Devices:  None  Non-Invasive Evaluation(s):  None  Recent CV Pertinent Labs: Lab Results  Component Value Date   K 3.4 (L) 12/19/2018   BUN 8 12/19/2018   CREATININE 0.53 12/19/2018     --------------------------------------------------------------------------------------------------  Past Medical History:  Diagnosis Date  . Ectopic pregnancy   . Ruptured right tubal ectopic pregnancy causing hemoperitoneum 07/18/2018    Past Surgical History:  Procedure Laterality Date  . ARTHROSCOPIC REPAIR ACL    . LAPAROSCOPY N/A 07/18/2018   Procedure: LAPAROSCOPY DIAGNOSTIC,right salpingostomy;  Surgeon: Gae Dry, MD;  Location: [facility] ORS;  Service: Gynecology;  Laterality: N/A;  ectopic supected to be in blood loss    Current Meds  Medication Sig  . acetaminophen (TYLENOL) 325 MG tablet Take 650 mg by mouth every 6 (six) hours as needed.  . ondansetron (ZOFRAN ODT) 4 MG disintegrating tablet Take 1 tablet (4 mg total) by mouth every 6 (six) hours as needed for nausea.  . prenatal vitamin w/FE, FA (PRENATAL 1 + 1) 27-1 MG TABS tablet Take 1 tablet by mouth daily at 12 noon.    Allergies: Chlorhexidine  Social History   Tobacco Use  . Smoking status: Never Smoker  . Smokeless tobacco: Never Used  Substance Use Topics  . Alcohol use: Never    Frequency: Never  . Drug use: Never    Family History  Problem Relation Age of Onset  . Diabetes Mellitus I Father   . Heart defect Father        Aortic stenosis (born with it)  . Hypothyroidism Mother     Review of Systems: A 12-system review of systems was performed and was negative except as noted in the HPI.  --------------------------------------------------------------------------------------------------  Physical Exam: BP 114/60 (BP Location: Right Arm, Patient Position: Sitting, Cuff Size: Normal)   Pulse 94   Ht 5' 8.5" (1.74 m)  Wt 228 lb 12 oz (103.8 kg)   LMP 08/30/2018   SpO2 98%   BMI 34.28 kg/m   General:  NAD HEENT: No conjunctival pallor or scleral icterus. Moist mucous membranes. OP clear. Neck: Supple without lymphadenopathy, thyromegaly, JVD, or HJR. No carotid bruit. Lungs: Normal work  of breathing. Clear to auscultation bilaterally without wheezes or crackles. Heart: Regular rate and rhythm with 1/6 holosystolic murmur.  No rubs or gallops. Non-displaced PMI. Abd: Bowel sounds present. Soft, NT/ND without hepatosplenomegaly.  Gravid uterus noted. Ext: No lower extremity edema. Radial, PT, and DP pulses are 2+ bilaterally Skin: Warm and dry without rash. Neuro: CNIII-XII intact. Strength and fine-touch sensation intact in upper and lower extremities bilaterally. Psych: Normal mood and affect.  EKG:  Normal sinus rhythm without abnormality.  Lab Results  Component Value Date   WBC 7.5 12/19/2018   HGB 12.1 12/19/2018   HCT 34.9 (L) 12/19/2018   MCV 85.5 12/19/2018   PLT 172 12/19/2018    Lab Results  Component Value Date   NA 137 12/19/2018   K 3.4 (L) 12/19/2018   CL 106 12/19/2018   CO2 22 12/19/2018   BUN 8 12/19/2018   CREATININE 0.53 12/19/2018   GLUCOSE 85 12/19/2018   ALT 14 12/19/2018    No results found for: CHOL, HDL, LDLCALC, LDLDIRECT, TRIG, CHOLHDL   --------------------------------------------------------------------------------------------------  ASSESSMENT AND PLAN: Chest pain, shortness of breath, and palpitations: Symptoms are not typical for angina.  Additionally, Heather Jacobs's young age and lack of cardiac risk factors make ASCVD very unlikely.  Time course, quality of pain, and normal EKG are not consistent with SCAD.  The discomfort could be reflective of a paroxysmal tachycarrhythmia or some noncardiac process.  Given her father's congential aortic stenosis, congenital cardiac condition is also a possibility, though exam today is unrevealing (soft murmur on exam is most consistent with an innocent flow murmur).  We will also repeat a BMP given recent mild hypokalemia and check a TSH.  We have agreed to obtain a 14-day event monitor and transthoracic echocardiogram.  If these studies are unrevealing, I think it is reasonable to defer  additional cardiac workup.  Follow-up: RTC in 6-8 weeks.  Yvonne Kendall, MD 01/26/2019 10:44 AM

## 2019-01-26 NOTE — Patient Instructions (Signed)
Medication Instructions:  Your physician recommends that you continue on your current medications as directed. Please refer to the Current Medication list given to you today.  If you need a refill on your cardiac medications before your next appointment, please call your pharmacy.   Lab work: Your physician recommends that you return for lab work in: TODAY - BMP, TSH.  If you have labs (blood work) drawn today and your tests are completely normal, you will receive your results only by: Marland Kitchen MyChart Message (if you have MyChart) OR . A paper copy in the mail If you have any lab test that is abnormal or we need to change your treatment, we will call you to review the results.  Testing/Procedures: Your physician has requested that you have an echocardiogram. Echocardiography is a painless test that uses sound waves to create images of your heart. It provides your doctor with information about the size and shape of your heart and how well your heart's chambers and valves are working. This procedure takes approximately one hour. There are no restrictions for this procedure. You may get an IV, if needed, to receive an ultrasound enhancing agent through to better visualize your heart.    Your physician has recommended that you wear an 14 DAY ZIO event monitor. Event monitors are medical devices that record the heart's electrical activity. Doctors most often [place] these monitors to diagnose arrhythmias. Arrhythmias are problems with the speed or rhythm of the heartbeat. The monitor is a small, portable device. You can wear one while you do your normal daily activities. This is usually used to diagnose what is causing palpitations/syncope (passing out).  A Zio Patch Event Heart monitor will be applied to your chest today.  You will wear the patch for 14 days. After 24 hours, you may shower with the heart monitor on. If you feel any SYMPTOMS, you may press and release the button in the middle of the monitor.    Follow-Up: At [facility], you and your health needs are our priority.  As part of our continuing mission to provide you with exceptional heart care, we have created designated Provider Care Teams.  These Care Teams include your primary Cardiologist (physician) and Advanced Practice Providers (APPs -  Physician Assistants and Nurse Practitioners) who all work together to provide you with the care you need, when you need it. You will need a follow up appointment in 6-8 weeks.  You may see DR Harrell Gave END or one of the following Advanced Practice Providers on your designated Care Team:   Murray Hodgkins, NP Christell Faith, PA-C . Marrianne Mood, PA-C   Echocardiogram An echocardiogram is a procedure that uses painless sound waves (ultrasound) to produce an image of the heart. Images from an echocardiogram can provide important information about:  Signs of coronary artery disease (CAD).  Aneurysm detection. An aneurysm is a weak or damaged part of an artery wall that bulges out from the normal force of blood pumping through the body.  Heart size and shape. Changes in the size or shape of the heart can be associated with certain conditions, including heart failure, aneurysm, and CAD.  Heart muscle function.  Heart valve function.  Signs of a past heart attack.  Fluid buildup around the heart.  Thickening of the heart muscle.  A tumor or infectious growth around the heart valves. Tell a health care provider about:  Any allergies you have.  All medicines you are taking, including vitamins, herbs, eye drops, creams,  and over-the-counter medicines.  Any blood disorders you have.  Any surgeries you have had.  Any medical conditions you have.  Whether you are pregnant or may be pregnant. What are the risks? Generally, this is a safe procedure. However, problems may occur, including:  Allergic reaction to dye (contrast) that may be used during the procedure. What happens  before the procedure? No specific preparation is needed. You may eat and drink normally. What happens during the procedure?   An IV tube may be inserted into one of your veins.  You may receive contrast through this tube. A contrast is an injection that improves the quality of the pictures from your heart.  A gel will be applied to your chest.  A wand-like tool (transducer) will be moved over your chest. The gel will help to transmit the sound waves from the transducer.  The sound waves will harmlessly bounce off of your heart to allow the heart images to be captured in real-time motion. The images will be recorded on a computer. The procedure may vary among health care providers and hospitals. What happens after the procedure?  You may return to your normal, everyday life, including diet, activities, and medicines, unless your health care provider tells you not to do that. Summary  An echocardiogram is a procedure that uses painless sound waves (ultrasound) to produce an image of the heart.  Images from an echocardiogram can provide important information about the size and shape of your heart, heart muscle function, heart valve function, and fluid buildup around your heart.  You do not need to do anything to prepare before this procedure. You may eat and drink normally.  After the echocardiogram is completed, you may return to your normal, everyday life, unless your health care provider tells you not to do that. This information is not intended to replace advice given to you by your health care provider. Make sure you discuss any questions you have with your health care provider. Document Released: 04/11/2000 Document Revised: 08/05/2018 Document Reviewed: 05/17/2016 Elsevier Patient Education  2020 [employer].

## 2019-01-27 LAB — BASIC METABOLIC PANEL
BUN/Creatinine Ratio: 13 (ref 9–23)
BUN: 7 mg/dL (ref 6–20)
CO2: 18 mmol/L — ABNORMAL LOW (ref 20–29)
Calcium: 8.7 mg/dL (ref 8.7–10.2)
Chloride: 103 mmol/L (ref 96–106)
Creatinine, Ser: 0.54 mg/dL — ABNORMAL LOW (ref 0.57–1.00)
GFR calc Af Amer: 153 mL/min/{1.73_m2} (ref >59)
GFR calc non Af Amer: 133 mL/min/{1.73_m2} (ref >59)
Glucose: 69 mg/dL (ref 65–99)
Potassium: 3.8 mmol/L (ref 3.5–5.2)
Sodium: 136 mmol/L (ref 134–144)

## 2019-01-27 LAB — TSH: TSH: 2.56 u[IU]/mL (ref 0.450–4.500)

## 2019-02-22 ENCOUNTER — Ambulatory Visit (INDEPENDENT_AMBULATORY_CARE_PROVIDER_SITE_OTHER): Payer: Medicaid Other | Admitting: Certified Nurse Midwife

## 2019-02-22 ENCOUNTER — Encounter: Payer: Self-pay | Admitting: Certified Nurse Midwife

## 2019-02-22 ENCOUNTER — Other Ambulatory Visit: Payer: Self-pay

## 2019-02-22 VITALS — BP 136/82

## 2019-02-22 DIAGNOSIS — Z3A23 23 weeks gestation of pregnancy: Secondary | ICD-10-CM

## 2019-02-22 DIAGNOSIS — O162 Unspecified maternal hypertension, second trimester: Secondary | ICD-10-CM | POA: Diagnosis not present

## 2019-02-22 DIAGNOSIS — Z3480 Encounter for supervision of other normal pregnancy, unspecified trimester: Secondary | ICD-10-CM

## 2019-02-22 NOTE — Progress Notes (Signed)
C/o  Has been having cramping; last night at work had ctx so bad she had to sit down - the most she has had is 3/hr.rj

## 2019-02-24 ENCOUNTER — Encounter: Payer: Self-pay | Admitting: Certified Nurse Midwife

## 2019-02-24 NOTE — Progress Notes (Signed)
Routine Prenatal Care Visit- Virtual Visit  Subjective   Virtual Visit via Telephone Note  I connected with Heather Jacobs on 02/22/2019 at  1:50 PM EDT by telephone and verified that I am speaking with the correct person using two identifiers.   I discussed the limitations, risks, security and privacy concerns of performing an evaluation and management service by telephone and the availability of in person appointments. I also discussed with the patient that there may be a patient responsible charge related to this service. The patient expressed understanding and agreed to proceed.  The patient was at home I spoke with the patient from my  Work station [employer] of people involved in this encounter were: CMA Velva Harman , and the patient  , and myself .   Heather Jacobs is a 23 y.o. G3P1011 at [redacted]w[redacted]d presenting for ongoing prenatal care.  She is currently monitored for the following issues for this low-risk pregnancy and has Supervision of other normal pregnancy, antepartum on their problem list.  ----------------------------------------------------------------------------------- Patient reports  That she was seen by cardiology for evaluation of her chest pain and is scheduled for an echocardiogram. Has a blod pressure cuff at home and she reports her blood pressure is 136/82.    Contractions: Irritability. Vag. Bleeding: None.  Movement: Present. Denies leaking of fluid.  Has had painful BH contractions at work, maybe three in a day. ----------------------------------------------------------------------------------- The following portions of the patient's history were reviewed and updated as appropriate: allergies, current medications, past family history, past medical history, past social history, past surgical history and problem list. Problem list updated.   Objective  Blood pressure 136/82, last menstrual period 08/30/2018. Pregravid weight 222 lb (100.7 kg) Total Weight Gain 8 lb  (3.629 kg)  Fetal Status:     Movement: Present     Physical Exam could not be performed. Because of the COVID-19 outbreak this visit was performed over the phone and not in person.   Assessment   23 y.o. G3P1011 at [redacted]w[redacted]d by  06/18/2019, by Ultrasound presenting for routine prenatal visit  Plan   pregnancy Problems (from 11/12/18 to present)    Problem Noted Resolved   Supervision of other normal pregnancy, antepartum 11/12/2018 by Rexene Agent, CNM No   Overview Addendum 12/21/2018  1:51 PM by Gae Dry, MD    Clinic [facility] Prenatal Labs  Dating [place] Blood type: B/Positive/-- (07/28 1505)   Genetic Screen  NIPS: nml XY Antibody:Negative (07/28 1505)  Anatomic [place] Normal, female infant Rubella: <0.90 (07/28 1505) Varicella: Immune   GTT   Third trimester:  RPR: Non Reactive (07/28 1505)   Rhogam na HBsAg: Negative (07/28 1505)   TDaP vaccine         Flu Shot: HIV: Non Reactive (07/28 1505)   Baby Food                                GBS:   Contraception  ONG:[id number]  CBB  no   CS/VBAC n/a   Support Person                    Follow Up Instructions: Advised patient to come in this week for a blood pressure check Will offer flu vaccine at that time and schedule for 28 week labs    I discussed the assessment and treatment plan with the patient. The patient was provided an opportunity to ask questions  and all were answered. The patient agreed with the plan and demonstrated an understanding of the instructions.   The patient was advised to call back or seek an in-person evaluation if the symptoms worsen or if the condition fails to improve as anticipated.  I provided 15 minutes of non-face-to-face time during this encounter.   Farrel Conners, CNM

## 2019-02-25 ENCOUNTER — Other Ambulatory Visit: Payer: Self-pay

## 2019-02-25 ENCOUNTER — Ambulatory Visit (INDEPENDENT_AMBULATORY_CARE_PROVIDER_SITE_OTHER): Payer: Medicaid Other | Admitting: Certified Nurse Midwife

## 2019-02-25 VITALS — BP 120/70 | Wt 235.0 lb

## 2019-02-25 DIAGNOSIS — Z131 Encounter for screening for diabetes mellitus: Secondary | ICD-10-CM

## 2019-02-25 DIAGNOSIS — Z3A23 23 weeks gestation of pregnancy: Secondary | ICD-10-CM

## 2019-02-25 DIAGNOSIS — Z3482 Encounter for supervision of other normal pregnancy, second trimester: Secondary | ICD-10-CM

## 2019-02-25 DIAGNOSIS — Z3480 Encounter for supervision of other normal pregnancy, unspecified trimester: Secondary | ICD-10-CM

## 2019-02-25 LAB — POCT URINALYSIS DIPSTICK OB
Glucose, UA: NEGATIVE
POC,PROTEIN,UA: NEGATIVE

## 2019-02-25 NOTE — Progress Notes (Signed)
No problems.rj 

## 2019-02-25 NOTE — Progress Notes (Signed)
ROB at 23wk6d: Good FM.Occasional BH contraction at work. Presented today for blood pressure check. She reported a BP of 136/82 at home Baby active. Has been having some chest pain. Has been undergoing cardiac evaluation and is scheduled for a echocardiogram Nov 6. FHTs WNL. BP 120/70. Negative proteinuria. No edema.  ROB and 28 week labs in 4 weeks Heather Jacobs, [place]

## 2019-03-04 ENCOUNTER — Ambulatory Visit (INDEPENDENT_AMBULATORY_CARE_PROVIDER_SITE_OTHER): Payer: Medicaid Other

## 2019-03-04 ENCOUNTER — Other Ambulatory Visit: Payer: Self-pay

## 2019-03-04 DIAGNOSIS — R079 Chest pain, unspecified: Secondary | ICD-10-CM | POA: Diagnosis not present

## 2019-03-07 ENCOUNTER — Other Ambulatory Visit: Payer: Self-pay

## 2019-03-07 ENCOUNTER — Ambulatory Visit (INDEPENDENT_AMBULATORY_CARE_PROVIDER_SITE_OTHER): Payer: Medicaid Other | Admitting: Maternal Newborn

## 2019-03-07 VITALS — BP 130/80 | Wt 239.0 lb

## 2019-03-07 DIAGNOSIS — Z3480 Encounter for supervision of other normal pregnancy, unspecified trimester: Secondary | ICD-10-CM

## 2019-03-07 DIAGNOSIS — Z3482 Encounter for supervision of other normal pregnancy, second trimester: Secondary | ICD-10-CM

## 2019-03-07 DIAGNOSIS — Z3A25 25 weeks gestation of pregnancy: Secondary | ICD-10-CM

## 2019-03-07 LAB — POCT URINALYSIS DIPSTICK OB
Glucose, UA: NEGATIVE
POC,PROTEIN,UA: NEGATIVE

## 2019-03-07 NOTE — Progress Notes (Signed)
    Prenatal Problem Visit  Subjective  Heather Jacobs is a 23 y.o. G3P1011 at [redacted]w[redacted]d being seen today for ongoing prenatal care.  She is currently monitored for the following issues for this low-risk pregnancy and has Supervision of other normal pregnancy, antepartum on their problem list.  ----------------------------------------------------------------------------------- Patient reports nosebleeds, sometimes lasting for 10 minutes. Had BP 140/88 at home. No headaches, RUQ pain, edema, visual changes. Vag. Bleeding: None.  Movement: Present. No leaking of fluid.  ----------------------------------------------------------------------------------- The following portions of the patient's history were reviewed and updated as appropriate: allergies, current medications, past family history, past medical history, past social history, past surgical history and problem list. Problem list updated.  Objective  Blood pressure 130/80, weight 239 lb (108.4 kg), last menstrual period 08/30/2018. Pregravid weight 222 lb (100.7 kg) Total Weight Gain 17 lb (7.711 kg) Urinalysis: Urine dipstick shows negative for glucose, protein. Fetal Status: Fetal Heart Rate (bpm): 138   Movement: Present     General:  Alert, oriented and cooperative. Patient is in no acute distress.  Skin: Skin is warm and dry. No rash noted.   Cardiovascular: Normal heart rate noted  Respiratory: Normal respiratory effort, no problems with respiration noted  Abdomen: Soft, gravid, appropriate for gestational age. Pain/Pressure: Present     Pelvic:  Cervical exam deferred        Extremities: Normal range of motion.     Mental Status: Normal mood and affect. Normal behavior. Normal judgment and thought content.     Assessment   23 y.o. G3P1011 at [redacted]w[redacted]d EDD 06/18/2019, by Ultrasound presenting for a work-in prenatal visit.  Plan   pregnancy Problems (from 11/12/18 to present)    Problem Noted Resolved   Supervision of other  normal pregnancy, antepartum 11/12/2018 by Rexene Agent, CNM No   Overview Addendum 02/24/2019  8:17 PM by Dalia Heading, [facility] Prenatal Labs  Dating [place] Blood type: B/Positive/-- (07/28 1505)   Genetic Screen  NIPS: nml XY Antibody:Negative (07/28 1505)  Anatomic [place] Normal, female fetus Rubella: <0.90 (07/28 1505) Varicella:  Immune  GTT   Third trimester:  RPR: Non Reactive (07/28 1505)   Rhogam na HBsAg: Negative (07/28 1505)   TDaP vaccine         Flu Shot: HIV: Non Reactive (07/28 1505)   Baby Food                                GBS:   Contraception  PYK:[id number]  CBB  no   CS/VBAC n/a   Support Person               Normotensive and no symptoms or proteinuria. Discussed that nosebleeds can be more common in pregnancy and measures to help stop them when they occur.  Keep ROB 03/23/2019  Avel Sensor, CNM 03/07/2019  11:19 AM

## 2019-03-07 NOTE — Patient Instructions (Signed)

## 2019-03-14 NOTE — Progress Notes (Signed)
Follow-up Outpatient Visit Date: 03/16/2019  Primary Care Provider: Patient, No Pcp Per No address on file  Chief Complaint: Follow-up chest pain and palpitations  HPI:  Heather Jacobs is a 23 y.o. pregnant female ([redacted]w[redacted]d with history of ectopic pregnancy, who presents for follow-up of chest pain.  I met her in late September, at which time she reported brief episodes of sharp, shooting chest pains lasting 30 seconds to a few minutes at a time.  The pain often occurs multiple times a day and has been associated with headaches, palpitations, lightheadedness, and shortness of breath.  Subsequent echocardiogram was normal.  14-day event monitor showed rare PAC's and PVC's but no significant arrhythmia.  Today, Ms. Knapik reports that her palpitations and chest pain have been less frequent (though not completely resolved).  Episodes occur about twice a week and are self-limited.  She has also noticed some acid reflux recently, for which she has been using Tums.  She continues to have intermittent headaches that accompany the palpitations.  She reports that her blood pressure has been borderline elevated a few times and is being followed closely by her OB.  --------------------------------------------------------------------------------------------------  Cardiovascular History & Procedures: Cardiovascular Problems:  Chest pain and palpitations  Risk Factors:  None  Cath/PCI:  None  CV Surgery:  None  EP Procedures and Devices:  14-day event monitor (01/26/2019): Predominantly sinus rhythm with rare PAC's and PVC's.  No significant arrhythmia to explain the patient's symptoms.  Non-Invasive Evaluation(s):  TTE (03/04/2019): Normal LV size and wall thickness.  LVEF 60-65% with normal diastolic function.  Normal RV size and function.  No significant valvular abnormality.  Mild MR was noted.  Recent CV Pertinent Labs: Lab Results  Component Value Date   K 3.8 01/26/2019   BUN 7  01/26/2019   CREATININE 0.54 (L) 01/26/2019    Past medical and surgical history were reviewed and updated in EPIC.  Current Meds  Medication Sig  . acetaminophen (TYLENOL) 325 MG tablet Take 650 mg by mouth every 6 (six) hours as needed.  . ondansetron (ZOFRAN ODT) 4 MG disintegrating tablet Take 1 tablet (4 mg total) by mouth every 6 (six) hours as needed for nausea.  . prenatal vitamin w/FE, FA (PRENATAL 1 + 1) 27-1 MG TABS tablet Take 1 tablet by mouth daily at 12 noon.    Allergies: Chlorhexidine  Social History   Tobacco Use  . Smoking status: Never Smoker  . Smokeless tobacco: Never Used  Substance Use Topics  . Alcohol use: Never    Frequency: Never  . Drug use: Never    Family History  Problem Relation Age of Onset  . Diabetes Mellitus I Father   . Heart defect Father        Aortic stenosis (born with it)  . Hypothyroidism Mother     Review of Systems: A 12-system review of systems was performed and was negative except as noted in the HPI.  --------------------------------------------------------------------------------------------------  Physical Exam: BP 120/78 (BP Location: Left Arm, Patient Position: Sitting, Cuff Size: Normal)   Pulse 97   Ht 5' 8.5" (1.74 m)   Wt 241 lb 12 oz (109.7 kg)   LMP 08/30/2018   SpO2 99%   BMI 36.22 kg/m   General:  NAD. HEENT: No conjunctival pallor or scleral icterus. Moist mucous membranes.  OP clear. Neck: Supple without lymphadenopathy, thyromegaly, JVD, or HJR. Lungs: Normal work of breathing. Clear to auscultation bilaterally without wheezes or crackles. Heart: Regular rate and rhythm without  murmurs, rubs, or gallops. Abd: Bowel sounds present. Soft, NT/ND.  Gravid uterus noted. Ext: No lower extremity edema. Radial, PT, and DP pulses are 2+ bilaterally. Skin: Warm and dry without rash.  EKG:  NSR without abnormality.  Lab Results  Component Value Date   WBC 7.5 12/19/2018   HGB 12.1 12/19/2018   HCT  34.9 (L) 12/19/2018   MCV 85.5 12/19/2018   PLT 172 12/19/2018    Lab Results  Component Value Date   NA 136 01/26/2019   K 3.8 01/26/2019   CL 103 01/26/2019   CO2 18 (L) 01/26/2019   BUN 7 01/26/2019   CREATININE 0.54 (L) 01/26/2019   GLUCOSE 69 01/26/2019   ALT 14 12/19/2018    No results found for: CHOL, HDL, LDLCALC, LDLDIRECT, TRIG, CHOLHDL  --------------------------------------------------------------------------------------------------  ASSESSMENT AND PLAN: Chest pain and palpitations: Chest pain remains atypical.  Recent echo was normal.  Event monitor showed rare PAC's and PVC's, which could be contributing to some of her symptoms.  We discussed the benign nature of these findings.  Given that the frequency of the episodes is decreasing, we have agreed to defer additional testing/intervention.  I think it is reasonable for her to continue using as needed Tums for possible component of GERD.  We will have her follow-up after delivery to see if her symptoms have abated.  Follow-up: Virtual visit in 4 months.  Nelva Bush, MD 03/17/2019 9:14 AM

## 2019-03-16 ENCOUNTER — Telehealth: Payer: Self-pay | Admitting: Internal Medicine

## 2019-03-16 ENCOUNTER — Ambulatory Visit (INDEPENDENT_AMBULATORY_CARE_PROVIDER_SITE_OTHER): Payer: Medicaid Other | Admitting: Internal Medicine

## 2019-03-16 ENCOUNTER — Encounter: Payer: Self-pay | Admitting: Internal Medicine

## 2019-03-16 ENCOUNTER — Other Ambulatory Visit: Payer: Self-pay

## 2019-03-16 VITALS — BP 120/78 | HR 97 | Ht 68.5 in | Wt 241.8 lb

## 2019-03-16 DIAGNOSIS — R0789 Other chest pain: Secondary | ICD-10-CM

## 2019-03-16 DIAGNOSIS — R002 Palpitations: Secondary | ICD-10-CM

## 2019-03-16 NOTE — Patient Instructions (Signed)
Medication Instructions:  Your physician recommends that you continue on your current medications as directed. Please refer to the Current Medication list given to you today.  *If you need a refill on your cardiac medications before your next appointment, please call your pharmacy*  Lab Work: none If you have labs (blood work) drawn today and your tests are completely normal, you will receive your results only by: Marland Kitchen MyChart Message (if you have MyChart) OR . A paper copy in the mail If you have any lab test that is abnormal or we need to change your treatment, we will call you to review the results.  Testing/Procedures: none  Follow-Up: At [facility], you and your health needs are our priority.  As part of our continuing mission to provide you with exceptional heart care, we have created designated Provider Care Teams.  These Care Teams include your primary Cardiologist (physician) and Advanced Practice Providers (APPs -  Physician Assistants and Nurse Practitioners) who all work together to provide you with the care you need, when you need it.  Your next appointment:   4 month(s)  The format for your next appointment:   Virtual Visit   Provider:    You may see DR Harrell Gave END or one of the following Advanced Practice Providers on your designated Care Team:    Murray Hodgkins, NP  Christell Faith, PA-C  Marrianne Mood, PA-C

## 2019-03-16 NOTE — Telephone Encounter (Signed)
Virtual Visit Pre-Appointment Phone Call  "(Name), I am calling you today to discuss your upcoming appointment. We are currently trying to limit exposure to the virus that causes COVID-19 by seeing patients at home rather than in the office."  1. "What is the BEST phone number to call the day of the visit?" - include this in appointment notes  2. Do you have or have access to (through a family member/friend) a smartphone with video capability that we can use for your visit?" a. If yes - list this number in appt notes as cell (if different from BEST phone #) and list the appointment type as a VIDEO visit in appointment notes b. If no - list the appointment type as a PHONE visit in appointment notes  3. Confirm consent - "In the setting of the current Covid19 crisis, you are scheduled for a (phone or video) visit with your provider on (date) at (time).  Just as we do with many in-office visits, in order for you to participate in this visit, we must obtain consent.  If you'd like, I can send this to your mychart (if signed up) or email for you to review.  Otherwise, I can obtain your verbal consent now.  All virtual visits are billed to your insurance company just like a normal visit would be.  By agreeing to a virtual visit, we'd like you to understand that the technology does not allow for your provider to perform an examination, and thus may limit your provider's ability to fully assess your condition. If your provider identifies any concerns that need to be evaluated in person, we will make arrangements to do so.  Finally, though the technology is pretty good, we cannot assure that it will always work on either your or our end, and in the setting of a video visit, we may have to convert it to a phone-only visit.  In either situation, we cannot ensure that we have a secure connection.  Are you willing to proceed?" STAFF: Did the patient verbally acknowledge consent to telehealth visit? Document  YES/NO here: YES  4. Advise patient to be prepared - "Two hours prior to your appointment, go ahead and check your blood pressure, pulse, oxygen saturation, and your weight (if you have the equipment to check those) and write them all down. When your visit starts, your provider will ask you for this information. If you have an Apple Watch or Kardia device, please plan to have heart rate information ready on the day of your appointment. Please have a pen and paper handy nearby the day of the visit as well."  5. Give patient instructions for MyChart download to smartphone OR Doximity/Doxy.me as below if video visit (depending on what platform provider is using)  6. Inform patient they will receive a phone call 15 minutes prior to their appointment time (may be from unknown caller ID) so they should be prepared to answer    Heather Jacobs has been deemed a candidate for a follow-up tele-health visit to limit community exposure during the Covid-19 pandemic. I spoke with the patient via phone to ensure availability of phone/video source, confirm preferred email & phone number, and discuss instructions and expectations.  I reminded Heather Jacobs to be prepared with any vital sign and/or heart rhythm information that could potentially be obtained via home monitoring, at the time of her visit. I reminded Heather Jacobs to expect a phone call prior to her visit.  [name] 03/16/2019 4:14 PM   INSTRUCTIONS FOR DOWNLOADING THE MYCHART APP TO SMARTPHONE  - The patient must first make sure to have activated MyChart and know their login information - If Apple, go to [employer] and type in MyChart in the search bar and download the app. If Android, ask patient to go to [employer] and type in [facility] in the search bar and download the app. The app is free but as with any other app downloads, their phone may require them to verify saved payment information or  Apple/Android password.  - The patient will need to then log into the app with their MyChart username and password, and select [facility] as their healthcare provider to link the account. When it is time for your visit, go to the MyChart app, find appointments, and click Begin Video Visit. Be sure to Select Allow for your device to access the Microphone and Camera for your visit. You will then be connected, and your provider will be with you shortly.  **If they have any issues connecting, or need assistance please contact MyChart service desk [phone])**  **If using a computer, in order to ensure the best quality for their visit they will need to use either of the following Internet Browsers: [employer] or Google Chrome**  IF USING DOXIMITY or DOXY.ME - The patient will receive a link just prior to their visit by text.     FULL LENGTH CONSENT FOR TELE-HEALTH VISIT   I hereby voluntarily request, consent and authorize [facility] and its employed or contracted physicians, physician assistants, nurse practitioners or other licensed health care professionals (the Practitioner), to provide me with telemedicine health care services (the Services") as deemed necessary by the treating Practitioner. I acknowledge and consent to receive the Services by the Practitioner via telemedicine. I understand that the telemedicine visit will involve communicating with the Practitioner through live audiovisual communication technology and the disclosure of certain medical information by electronic transmission. I acknowledge that I have been given the opportunity to request an in-person assessment or other available alternative prior to the telemedicine visit and am voluntarily participating in the telemedicine visit.  I understand that I have the right to withhold or withdraw my consent to the use of telemedicine in the course of my care at any time, without affecting my right to future care  or treatment, and that the Practitioner or I may terminate the telemedicine visit at any time. I understand that I have the right to inspect all information obtained and/or recorded in the course of the telemedicine visit and may receive copies of available information for a reasonable fee.  I understand that some of the potential risks of receiving the Services via telemedicine include:   Delay or interruption in medical evaluation due to technological equipment failure or disruption;  Information transmitted may not be sufficient (e.g. poor resolution of images) to allow for appropriate medical decision making by the Practitioner; and/or   In rare instances, security protocols could fail, causing a breach of personal health information.  Furthermore, I acknowledge that it is my responsibility to provide information about my medical history, conditions and care that is complete and accurate to the best of my ability. I acknowledge that Practitioner's advice, recommendations, and/or decision may be based on factors not within their control, such as incomplete or inaccurate data provided by me or distortions of diagnostic images or specimens that may result from electronic transmissions. I understand that the  practice of medicine is not an Chief Strategy Officer and that Practitioner makes no warranties or guarantees regarding treatment outcomes. I acknowledge that I will receive a copy of this consent concurrently upon execution via email to the email address I last provided but may also request a printed copy by calling the office of [facility].    I understand that my insurance will be billed for this visit.   I have read or had this consent read to me.  I understand the contents of this consent, which adequately explains the benefits and risks of the Services being provided via telemedicine.   I have been provided ample opportunity to ask questions regarding this consent and the Services and have had  my questions answered to my satisfaction.  I give my informed consent for the services to be provided through the use of telemedicine in my medical care  By participating in this telemedicine visit I agree to the above.

## 2019-03-17 ENCOUNTER — Encounter: Payer: Self-pay | Admitting: Internal Medicine

## 2019-03-17 DIAGNOSIS — R0789 Other chest pain: Secondary | ICD-10-CM | POA: Insufficient documentation

## 2019-03-17 DIAGNOSIS — R002 Palpitations: Secondary | ICD-10-CM | POA: Insufficient documentation

## 2019-03-23 ENCOUNTER — Encounter: Payer: Medicaid Other | Admitting: Obstetrics and Gynecology

## 2019-03-23 ENCOUNTER — Other Ambulatory Visit: Payer: Medicaid Other

## 2019-04-06 ENCOUNTER — Other Ambulatory Visit: Payer: Medicaid Other

## 2019-04-06 ENCOUNTER — Encounter: Payer: Self-pay | Admitting: Obstetrics and Gynecology

## 2019-04-06 ENCOUNTER — Other Ambulatory Visit: Payer: Self-pay

## 2019-04-06 ENCOUNTER — Ambulatory Visit (INDEPENDENT_AMBULATORY_CARE_PROVIDER_SITE_OTHER): Payer: Medicaid Other | Admitting: Obstetrics and Gynecology

## 2019-04-06 VITALS — BP 128/80 | Temp 97.5°F | Wt 245.0 lb

## 2019-04-06 DIAGNOSIS — Z3480 Encounter for supervision of other normal pregnancy, unspecified trimester: Secondary | ICD-10-CM

## 2019-04-06 DIAGNOSIS — Z3483 Encounter for supervision of other normal pregnancy, third trimester: Secondary | ICD-10-CM

## 2019-04-06 DIAGNOSIS — Z3A29 29 weeks gestation of pregnancy: Secondary | ICD-10-CM

## 2019-04-06 DIAGNOSIS — Z23 Encounter for immunization: Secondary | ICD-10-CM | POA: Diagnosis not present

## 2019-04-06 DIAGNOSIS — O26843 Uterine size-date discrepancy, third trimester: Secondary | ICD-10-CM

## 2019-04-06 DIAGNOSIS — Z131 Encounter for screening for diabetes mellitus: Secondary | ICD-10-CM

## 2019-04-06 NOTE — Progress Notes (Signed)
    Routine Prenatal Care Visit  Subjective  Heather Jacobs is a 23 y.o. G3P1011 at [redacted]w[redacted]d being seen today for ongoing prenatal care.  She is currently monitored for the following issues for this low-risk pregnancy and has Supervision of other normal pregnancy, antepartum; Palpitations; and Atypical chest pain on their problem list.  ----------------------------------------------------------------------------------- Patient reports no complaints.   Contractions: Not present. Vag. Bleeding: None.  Movement: Present. Denies leaking of fluid.  ----------------------------------------------------------------------------------- The following portions of the patient's history were reviewed and updated as appropriate: allergies, current medications, past family history, past medical history, past social history, past surgical history and problem list. Problem list updated.   Objective  Blood pressure 128/80, temperature (!) 97.5 F (36.4 C), weight 245 lb (111.1 kg), last menstrual period 08/30/2018. Pregravid weight 222 lb (100.7 kg) Total Weight Gain 23 lb (10.4 kg) Urinalysis:      Fetal Status: Fetal Heart Rate (bpm): 140 Fundal Height: 34 cm Movement: Present     General:  Alert, oriented and cooperative. Patient is in no acute distress.  Skin: Skin is warm and dry. No rash noted.   Cardiovascular: Normal heart rate noted  Respiratory: Normal respiratory effort, no problems with respiration noted  Abdomen: Soft, gravid, appropriate for gestational age. Pain/Pressure: Absent     Pelvic:  Cervical exam deferred        Extremities: Normal range of motion.     Mental Status: Normal mood and affect. Normal behavior. Normal judgment and thought content.     Assessment   23 y.o. G3P1011 at [redacted]w[redacted]d by  06/18/2019, by Ultrasound presenting for routine prenatal visit  Plan   pregnancy Problems (from 11/12/18 to present)    Problem Noted Resolved   Supervision of other normal pregnancy,  antepartum 11/12/2018 by Rexene Agent, CNM No   Overview Addendum 04/06/2019  4:06 PM by Homero Fellers, MD    [facility] Prenatal Labs  Dating [place] Blood type: B/Positive/-- (07/28 1505)   Genetic Screen  NIPS: nml XY Antibody:Negative (07/28 1505)  Anatomic [place] Normal, female fetus Rubella: <0.90 (07/28 1505) Varicella:  Immune  GTT   Third trimester:  RPR: Non Reactive (07/28 1505)   Rhogam na HBsAg: Negative (07/28 1505)   TDaP vaccine  04/06/2019        Flu Shot:04/06/2019 HIV: Non Reactive (07/28 1505)   Baby Food    Breast                            GBS:   Contraception  IUD [id number]  CBB  no   CS/VBAC n/a   Support Person                  Gestational age appropriate obstetric precautions including but not limited to vaginal bleeding, contractions, leaking of fluid and fetal movement were reviewed in detail with the patient.    Return in about 2 weeks (around 04/20/2019) for [place] and [place].  Homero Fellers MD [facility], [facility] 04/06/2019, 4:08 PM

## 2019-04-06 NOTE — Patient Instructions (Signed)
[email or web address]  Third Trimester of Pregnancy The third trimester is from week 28 through week 40 (months 7 through 9). The third trimester is a time when the unborn baby (fetus) is growing rapidly. At the end of the ninth month, the fetus is about 20 inches in length and weighs 6-10 pounds. Body changes during your third trimester Your body will continue to go through many changes during pregnancy. The changes vary from woman to woman. During the third trimester:  Your weight will continue to increase. You can expect to gain 25-35 pounds (11-16 kg) by the end of the pregnancy.  You may begin to get stretch marks on your hips, abdomen, and breasts.  You may urinate more often because the fetus is moving lower into your pelvis and pressing on your bladder.  You may develop or continue to have heartburn. This is caused by increased hormones that slow down muscles in the digestive tract.  You may develop or continue to have constipation because increased hormones slow digestion and cause the muscles that push waste through your intestines to relax.  You may develop hemorrhoids. These are swollen veins (varicose veins) in the rectum that can itch or be painful.  You may develop swollen, bulging veins (varicose veins) in your legs.  You may have increased body aches in the pelvis, back, or thighs. This is due to weight gain and increased hormones that are relaxing your joints.  You may have changes in your hair. These can include thickening of your hair, rapid growth, and changes in texture. Some women also have hair loss during or after pregnancy, or hair that feels dry or thin. Your hair will most likely return to normal after your baby is born.  Your breasts will continue to grow and they will continue to become tender. A yellow fluid (colostrum) may leak from your breasts. This is the first milk you are producing for your baby.  Your belly button may stick out.  You may notice more  swelling in your hands, face, or ankles.  You may have increased tingling or numbness in your hands, arms, and legs. The skin on your belly may also feel numb.  You may feel short of breath because of your expanding uterus.  You may have more problems sleeping. This can be caused by the size of your belly, increased need to urinate, and an increase in your body's metabolism.  You may notice the fetus "dropping," or moving lower in your abdomen (lightening).  You may have increased vaginal discharge.  You may notice your joints feel loose and you may have pain around your pelvic bone. What to expect at prenatal visits You will have prenatal exams every 2 weeks until week 36. Then you will have weekly prenatal exams. During a routine prenatal visit:  You will be weighed to make sure you and the baby are growing normally.  Your blood pressure will be taken.  Your abdomen will be measured to track your baby's growth.  The fetal heartbeat will be listened to.  Any test results from the previous visit will be discussed.  You may have a cervical check near your due date to see if your cervix has softened or thinned (effaced).  You will be tested for Group B streptococcus. This happens between 35 and 37 weeks. Your health care provider may ask you:  What your birth plan is.  How you are feeling.  If you are feeling the baby move.  If you have had  symptoms, such as leaking fluid, bleeding, severe headaches, or abdominal cramping.  If you are using any tobacco products, including cigarettes, chewing tobacco, and electronic cigarettes.  If you have any questions. Other tests or screenings that may be performed during your third trimester include:  Blood tests that check for low iron levels (anemia).  Fetal testing to check the health, activity level, and growth of the fetus. Testing is done if you have certain medical conditions or if there are problems during the pregnancy.  Nonstress test  (NST). This test checks the health of your baby to make sure there are no signs of problems, such as the baby not getting enough oxygen. During this test, a belt is placed around your belly. The baby is made to move, and its heart rate is monitored during movement. What is false labor? False labor is a condition in which you feel small, irregular tightenings of the muscles in the womb (contractions) that usually go away with rest, changing position, or drinking water. These are called Braxton Hicks contractions. Contractions may last for hours, days, or even weeks before true labor sets in. If contractions come at regular intervals, become more frequent, increase in intensity, or become painful, you should see your health care provider. What are the signs of labor?  Abdominal cramps.  Regular contractions that start at 10 minutes apart and become stronger and more frequent with time.  Contractions that start on the top of the uterus and spread down to the lower abdomen and back.  Increased pelvic pressure and dull back pain.  A watery or bloody mucus discharge that comes from the vagina.  Leaking of amniotic fluid. This is also known as your "water breaking." It could be a slow trickle or a gush. Let your health care provider know if it has a color or strange odor. If you have any of these signs, call your health care provider right away, even if it is before your due date. Follow these instructions at home: Medicines  Follow your health care provider's instructions regarding medicine use. Specific medicines may be either safe or unsafe to take during pregnancy.  Take a prenatal vitamin that contains at least 600 micrograms (mcg) of folic acid.  If you develop constipation, try taking a stool softener if your health care provider approves. Eating and drinking   Eat a balanced diet that includes fresh fruits and vegetables, whole grains, good sources of protein such as meat, eggs, or tofu,  and low-fat dairy. Your health care provider will help you determine the amount of weight gain that is right for you.  Avoid raw meat and uncooked cheese. These carry germs that can cause birth defects in the baby.  If you have low calcium intake from food, talk to your health care provider about whether you should take a daily calcium supplement.  Eat four or five small meals rather than three large meals a day.  Limit foods that are high in fat and processed sugars, such as fried and sweet foods.  To prevent constipation: ? Drink enough fluid to keep your urine clear or pale yellow. ? Eat foods that are high in fiber, such as fresh fruits and vegetables, whole grains, and beans. Activity  Exercise only as directed by your health care provider. Most women can continue their usual exercise routine during pregnancy. Try to exercise for 30 minutes at least 5 days a week. Stop exercising if you experience uterine contractions.  Avoid heavy lifting.  Do   Do not exercise in extreme heat or humidity, or at high altitudes.  Wear low-heel, comfortable shoes.  Practice good posture.  You may continue to have sex unless your health care provider tells you otherwise. Relieving pain and discomfort  Take frequent breaks and rest with your legs elevated if you have leg cramps or low back pain.  Take warm sitz baths to soothe any pain or discomfort caused by hemorrhoids. Use hemorrhoid cream if your health care provider approves.  Wear a good support bra to prevent discomfort from breast tenderness.  If you develop varicose veins: ? Wear support pantyhose or compression stockings as told by your healthcare provider. ? Elevate your feet for 15 minutes, 3-4 times a day. Prenatal care  Write down your questions. Take them to your prenatal visits.  Keep all your prenatal visits as told by your health care provider. This is important. Safety  Wear your seat belt at  all times when driving.  Make a list of emergency phone numbers, including numbers for family, friends, the hospital, and police and fire departments. General instructions  Avoid cat litter boxes and soil used by cats. These carry germs that can cause birth defects in the baby. If you have a cat, ask someone to clean the litter box for you.  Do not travel far distances unless it is absolutely necessary and only with the approval of your health care provider.  Do not use hot tubs, steam rooms, or saunas.  Do not drink alcohol.  Do not use any products that contain nicotine or tobacco, such as cigarettes and e-cigarettes. If you need help quitting, ask your health care provider.  Do not use any medicinal herbs or unprescribed drugs. These chemicals affect the formation and growth of the baby.  Do not douche or use tampons or scented sanitary pads.  Do not cross your legs for long periods of time.  To prepare for the arrival of your baby: ? Take prenatal classes to understand, practice, and ask questions about labor and delivery. ? Make a trial run to the hospital. ? Visit the hospital and tour the maternity area. ? Arrange for maternity or paternity leave through employers. ? Arrange for family and friends to take care of pets while you are in the hospital. ? Purchase a rear-facing car seat and make sure you know how to install it in your car. ? Pack your hospital bag. ? Prepare the baby's nursery. Make sure to remove all pillows and stuffed animals from the baby's crib to prevent suffocation.  Visit your dentist if you have not gone during your pregnancy. Use a soft toothbrush to brush your teeth and be gentle when you floss. Contact a health care provider if:  You are unsure if you are in labor or if your water has broken.  You become dizzy.  You have mild pelvic cramps, pelvic pressure, or nagging pain in your abdominal area.  You have lower back pain.  You have persistent  nausea, vomiting, or diarrhea.  You have an unusual or bad smelling vaginal discharge.  You have pain when you urinate. Get help right away if:  Your water breaks before 37 weeks.  You have regular contractions less than 5 minutes apart before 37 weeks.  You have a fever.  You are leaking fluid from your vagina.  You have spotting or bleeding from your vagina.  You have severe abdominal pain or cramping.  You have rapid weight loss or weight gain.  You   have shortness of breath with chest pain.  You notice sudden or extreme swelling of your face, hands, ankles, feet, or legs.  Your baby makes fewer than 10 movements in 2 hours.  You have severe headaches that do not go away when you take medicine.  You have vision changes. Summary  The third trimester is from week 28 through week 40, months 7 through 9. The third trimester is a time when the unborn baby (fetus) is growing rapidly.  During the third trimester, your discomfort may increase as you and your baby continue to gain weight. You may have abdominal, leg, and back pain, sleeping problems, and an increased need to urinate.  During the third trimester your breasts will keep growing and they will continue to become tender. A yellow fluid (colostrum) may leak from your breasts. This is the first milk you are producing for your baby.  False labor is a condition in which you feel small, irregular tightenings of the muscles in the womb (contractions) that eventually go away. These are called Braxton Hicks contractions. Contractions may last for hours, days, or even weeks before true labor sets in.  Signs of labor can include: abdominal cramps; regular contractions that start at 10 minutes apart and become stronger and more frequent with time; watery or bloody mucus discharge that comes from the vagina; increased pelvic pressure and dull back pain; and leaking of amniotic fluid. This information is not intended to replace advice  given to you by your health care provider. Make sure you discuss any questions you have with your health care provider. Document Released: 04/08/2001 Document Revised: 08/05/2018 Document Reviewed: 05/20/2016 Elsevier Patient Education  2020 [employer].

## 2019-04-06 NOTE — Progress Notes (Signed)
ROB/ GTT No concerns Denies lof, no vb, Good FM 

## 2019-04-07 LAB — 28 WEEK RH+PANEL
Basophils Absolute: 0 10*3/uL (ref 0.0–0.2)
Basos: 0 %
EOS (ABSOLUTE): 0 10*3/uL (ref 0.0–0.4)
Eos: 0 %
Gestational Diabetes Screen: 125 mg/dL (ref 65–139)
HIV Screen 4th Generation wRfx: NONREACTIVE
Hematocrit: 35.2 % (ref 34.0–46.6)
Hemoglobin: 12.1 g/dL (ref 11.1–15.9)
Immature Grans (Abs): 0.1 10*3/uL (ref 0.0–0.1)
Immature Granulocytes: 1 %
Lymphocytes Absolute: 0.9 10*3/uL (ref 0.7–3.1)
Lymphs: 10 %
MCH: 29.1 pg (ref 26.6–33.0)
MCHC: 34.4 g/dL (ref 31.5–35.7)
MCV: 85 fL (ref 79–97)
Monocytes Absolute: 0.6 10*3/uL (ref 0.1–0.9)
Monocytes: 6 %
Neutrophils Absolute: 8 10*3/uL — ABNORMAL HIGH (ref 1.4–7.0)
Neutrophils: 83 %
Platelets: 192 10*3/uL (ref 150–450)
RBC: 4.16 x10E6/uL (ref 3.77–5.28)
RDW: 11.8 % (ref 11.7–15.4)
RPR Ser Ql: NONREACTIVE
WBC: 9.6 10*3/uL (ref 3.4–10.8)

## 2019-04-20 ENCOUNTER — Other Ambulatory Visit: Payer: Self-pay

## 2019-04-20 ENCOUNTER — Ambulatory Visit (INDEPENDENT_AMBULATORY_CARE_PROVIDER_SITE_OTHER): Payer: Medicaid Other

## 2019-04-20 ENCOUNTER — Ambulatory Visit (INDEPENDENT_AMBULATORY_CARE_PROVIDER_SITE_OTHER): Payer: Medicaid Other | Admitting: Advanced Practice Midwife

## 2019-04-20 ENCOUNTER — Encounter: Payer: Self-pay | Admitting: Advanced Practice Midwife

## 2019-04-20 VITALS — BP 122/74 | Wt 249.0 lb

## 2019-04-20 DIAGNOSIS — Z3A31 31 weeks gestation of pregnancy: Secondary | ICD-10-CM

## 2019-04-20 DIAGNOSIS — Z3480 Encounter for supervision of other normal pregnancy, unspecified trimester: Secondary | ICD-10-CM

## 2019-04-20 DIAGNOSIS — Z3483 Encounter for supervision of other normal pregnancy, third trimester: Secondary | ICD-10-CM

## 2019-04-20 DIAGNOSIS — O26843 Uterine size-date discrepancy, third trimester: Secondary | ICD-10-CM

## 2019-04-20 DIAGNOSIS — Z362 Encounter for other antenatal screening follow-up: Secondary | ICD-10-CM

## 2019-04-20 NOTE — Progress Notes (Signed)
Routine Prenatal Care Visit  Subjective  Klover Priestly is a 23 y.o. G3P1011 at [redacted]w[redacted]d being seen today for ongoing prenatal care.  She is currently monitored for the following issues for this low-risk pregnancy and has Supervision of other normal pregnancy, antepartum; Palpitations; and Atypical chest pain on their problem list.  ----------------------------------------------------------------------------------- Patient reports no complaints.   Contractions: Not present. Vag. Bleeding: None.  Movement: Present. Leaking Fluid denies.  ----------------------------------------------------------------------------------- The following portions of the patient's history were reviewed and updated as appropriate: allergies, current medications, past family history, past medical history, past social history, past surgical history and problem list. Problem list updated.  Objective  Blood pressure 122/74, weight 249 lb (112.9 kg), last menstrual period 08/30/2018. Pregravid weight 222 lb (100.7 kg) Total Weight Gain 27 lb (12.2 kg) Urinalysis: Urine Protein    Urine Glucose    Fetal Status: Fetal Heart Rate (bpm): 136 Fundal Height: 34 cm Movement: Present      Growth scan today: 69%, 4 pounds 10 ounces, AC 84%, AFI: 16.1 cm, cephalic  General:  Alert, oriented and cooperative. Patient is in no acute distress.  Skin: Skin is warm and dry. No rash noted.   Cardiovascular: Normal heart rate noted  Respiratory: Normal respiratory effort, no problems with respiration noted  Abdomen: Soft, gravid, appropriate for gestational age. Pain/Pressure: Absent     Pelvic:  Cervical exam deferred        Extremities: Normal range of motion.  Edema: None  Mental Status: Normal mood and affect. Normal behavior. Normal judgment and thought content.   The following were addressed during this visit:  Breastfeeding Education - Early initiation of breastfeeding    Comments: Keeps milk supply adequate, helps  contract uterus and slow bleeding, and early milk is the perfect first food and is easy to digest.   - The importance of exclusive breastfeeding    Comments: Provides antibodies, Lower risk of breast and ovarian cancers, and type-2 diabetes,Helps your body recover, Reduced chance of SIDS.   - Risks of giving your baby anything other than breast milk if you are breastfeeding    Comments: Make the baby less content with breastfeeds, may make my baby more susceptible to illness, and may reduce my milk supply.   - The importance of early skin-to-skin contact    Comments: Keeps baby warm and secure, helps keep baby's blood sugar up and breathing steady, easier to bond and breastfeed, and helps calm baby.  - Rooming-in on a 24-hour basis    Comments: Easier to learn baby's feeding cues, easier to bond and get to know each other, and encourages milk production.   - Feeding on demand or baby-led feeding    Comments: Helps prevent breastfeeding complications, helps bring in good milk supply, prevents under or overfeeding, and helps baby feel content and satisfied   - Frequent feeding to help assure optimal milk production    Comments: Making a full supply of milk requires frequent removal of milk from breasts, infant will eat 8-12 times in 24 hours, if separated from infant use breast massage, hand expression and/ or pumping to remove milk from breasts.   - Effective positioning and attachment    Comments: Helps my baby to get enough breast milk, helps to produce an adequate milk supply, and helps prevent nipple pain and damage   - Exclusive breastfeeding for the first 6 months    Comments: Builds a healthy milk supply and keeps it up, protects baby from sickness and  disease, and breastmilk has everything your baby needs for the first 6 months.   Assessment   23 y.o. G3P1011 at [redacted]w[redacted]d by  06/18/2019, by Ultrasound presenting for routine prenatal visit  Plan   pregnancy Problems (from  11/12/18 to present)    Problem Noted Resolved   Supervision of other normal pregnancy, antepartum 11/12/2018 by Oswaldo Conroy, CNM No   Overview Addendum 04/11/2019  1:35 PM by Farrel Conners, CNM    Clinic [facility] Prenatal Labs  Dating [place] Blood type: B/Positive/-- (07/28 1505)   Genetic Screen  NIPS: nml XY Antibody:Negative (07/28 1505)  Anatomic [place] Normal, female fetus Rubella: <0.90 (07/28 1505) Varicella:  Immune  GTT   Third trimester: 125 RPR: Non Reactive (07/28 1505)   Rhogam na HBsAg: Negative (07/28 1505)   TDaP vaccine  04/06/2019        Flu Shot:04/06/2019 HIV: Non Reactive (07/28 1505)   Baby Food    Breast                            GBS:   Contraception  IUD OAC:[id number]  CBB  no   CS/VBAC n/a   Support Person                  Preterm labor symptoms and general obstetric precautions including but not limited to vaginal bleeding, contractions, leaking of fluid and fetal movement were reviewed in detail with the patient.    Return in about 2 weeks (around 05/04/2019) for rob.  Tresea Mall, CNM 04/20/2019 2:42 PM

## 2019-04-20 NOTE — Progress Notes (Signed)
No vb. No lof ultrasound today

## 2019-04-29 NOTE — L&D Delivery Note (Signed)
Delivery Note Primary OB: [facility] Delivery Physician: Annamarie Major, MD Gestational Age: Full term Antepartum complications: pregnancy-induced hypertension Intrapartum complications: None  A viable Female was delivered via vertex presentation. "Greig Castilla" Apgars:8 ,9  Weight:  pending .   Placenta status: spontaneous and Intact.  Cord: 3+ vessels;  with the following complications: nuchal.  Anesthesia:  epidural Episiotomy:  none Lacerations:  2nd Suture Repair: 2.0 vicryl Est. Blood Loss (mL):  200 mL  Mom to postpartum.  Baby to Couplet care / Skin to Skin.  Annamarie Major, MD, [facility], [facility] 05/29/2019  11:49 PM [phone]

## 2019-05-03 ENCOUNTER — Encounter: Payer: Self-pay | Admitting: Advanced Practice Midwife

## 2019-05-03 ENCOUNTER — Other Ambulatory Visit: Payer: Self-pay

## 2019-05-03 ENCOUNTER — Encounter: Payer: Self-pay | Admitting: Obstetrics and Gynecology

## 2019-05-03 ENCOUNTER — Observation Stay
Admission: EM | Admit: 2019-05-03 | Discharge: 2019-05-03 | Disposition: A | Discharge status: Home / Self Care | Payer: Medicaid Other | Attending: Obstetrics and Gynecology | Admitting: Obstetrics and Gynecology

## 2019-05-03 ENCOUNTER — Ambulatory Visit (INDEPENDENT_AMBULATORY_CARE_PROVIDER_SITE_OTHER): Payer: Medicaid Other | Admitting: Advanced Practice Midwife

## 2019-05-03 VITALS — BP 140/88 | Wt 253.0 lb

## 2019-05-03 DIAGNOSIS — O163 Unspecified maternal hypertension, third trimester: Secondary | ICD-10-CM | POA: Diagnosis present

## 2019-05-03 DIAGNOSIS — O26893 Other specified pregnancy related conditions, third trimester: Principal | ICD-10-CM | POA: Insufficient documentation

## 2019-05-03 DIAGNOSIS — R030 Elevated blood-pressure reading, without diagnosis of hypertension: Secondary | ICD-10-CM | POA: Insufficient documentation

## 2019-05-03 DIAGNOSIS — Z3480 Encounter for supervision of other normal pregnancy, unspecified trimester: Secondary | ICD-10-CM

## 2019-05-03 DIAGNOSIS — Z3A33 33 weeks gestation of pregnancy: Secondary | ICD-10-CM

## 2019-05-03 DIAGNOSIS — Z3483 Encounter for supervision of other normal pregnancy, third trimester: Secondary | ICD-10-CM

## 2019-05-03 DIAGNOSIS — R519 Headache, unspecified: Secondary | ICD-10-CM | POA: Insufficient documentation

## 2019-05-03 LAB — CBC
HCT: 34.3 % — ABNORMAL LOW (ref 36.0–46.0)
Hemoglobin: 11.3 g/dL — ABNORMAL LOW (ref 12.0–15.0)
MCH: 28 pg (ref 26.0–34.0)
MCHC: 32.9 g/dL (ref 30.0–36.0)
MCV: 85.1 fL (ref 80.0–100.0)
Platelets: 168 10*3/uL (ref 150–400)
RBC: 4.03 MIL/uL (ref 3.87–5.11)
RDW: 12.2 % (ref 11.5–15.5)
WBC: 10 10*3/uL (ref 4.0–10.5)
nRBC: 0 % (ref 0.0–0.2)

## 2019-05-03 LAB — COMPREHENSIVE METABOLIC PANEL
ALT: 20 U/L (ref 0–44)
AST: 22 U/L (ref 15–41)
Albumin: 2.6 g/dL — ABNORMAL LOW (ref 3.5–5.0)
Alkaline Phosphatase: 77 U/L (ref 38–126)
Anion gap: 8 (ref 5–15)
BUN: 8 mg/dL (ref 6–20)
CO2: 20 mmol/L — ABNORMAL LOW (ref 22–32)
Calcium: 8.6 mg/dL — ABNORMAL LOW (ref 8.9–10.3)
Chloride: 107 mmol/L (ref 98–111)
Creatinine, Ser: 0.54 mg/dL (ref 0.44–1.00)
GFR calc Af Amer: >60 mL/min (ref >60)
GFR calc non Af Amer: >60 mL/min (ref >60)
Glucose, Bld: 112 mg/dL — ABNORMAL HIGH (ref 70–99)
Potassium: 3.8 mmol/L (ref 3.5–5.1)
Sodium: 135 mmol/L (ref 135–145)
Total Bilirubin: 0.4 mg/dL (ref 0.3–1.2)
Total Protein: 6.4 g/dL — ABNORMAL LOW (ref 6.5–8.1)

## 2019-05-03 LAB — TYPE AND SCREEN
ABO/RH(D): B POS
Antibody Screen: NEGATIVE

## 2019-05-03 LAB — PROTEIN / CREATININE RATIO, URINE
Creatinine, Urine: 43 mg/dL
Total Protein, Urine: <6 mg/dL

## 2019-05-03 MED ORDER — PROCHLORPERAZINE MALEATE 10 MG PO TABS
10.0000 mg | ORAL_TABLET | ORAL | Status: AC
  Administered 2019-05-03: 10 mg via ORAL
  Filled 2019-05-03: qty 1

## 2019-05-03 MED ORDER — DIPHENHYDRAMINE HCL 25 MG PO CAPS
50.0000 mg | ORAL_CAPSULE | ORAL | Status: AC
  Administered 2019-05-03: 16:00:00 50 mg via ORAL
  Filled 2019-05-03: qty 2

## 2019-05-03 MED ORDER — PROCHLORPERAZINE MALEATE 10 MG PO TABS: 10.0000 mg | ORAL_TABLET | Freq: Three times a day (TID) | ORAL | 0 refills | Status: DC | PRN

## 2019-05-03 MED ORDER — FAMOTIDINE 20 MG PO TABS
20.0000 mg | ORAL_TABLET | ORAL | Status: AC
  Administered 2019-05-03: 16:00:00 20 mg via ORAL
  Filled 2019-05-03: qty 1

## 2019-05-03 NOTE — Progress Notes (Signed)
BP check, was elevated this AM.

## 2019-05-03 NOTE — Final Progress Note (Signed)
Physician Final Progress Note  Patient ID: Heather Jacobs MRN: [id number] DOB/AGE: Dec 20, 1995 24 y.o.  Admit date: 05/03/2019 Admitting provider: Conard Novak, MD Discharge date: 05/03/2019   Admission Diagnoses:  1) intrauterine pregnancy at [redacted]w[redacted]d  2) headache in third trimeter 3) elevated blood pressure affecting pregnancy in third trimester  Discharge Diagnoses:  1) intrauterine pregnancy at [redacted]w[redacted]d  2) headache in third trimeter - resolved 3) elevated blood pressure affecting pregnancy in third trimester - no evidence of hypertensive disorder of pregnacny  History of Present Illness: The patient is a 24 y.o. female G3P1011 at [redacted]w[redacted]d who presents from clinic today for two elevated blood pressures. She had called in to the clinic due to having a headache for the past 1.5 weeks that did not subside with Tylenol.  She also had an elevated blood pressure reading at home.  She was evaluated in clinic today and her elevated blood pressure was confirmed. Up on arrival to L&D, she continued to have headache. She denied current visual changes, but had noted some in the past few days.  She denied RUQ and epigastric pain.  She notes +FM (though less than normal), no LOF, and no vaginal bleeding. She also notes no contractions. She also noted some nausea that she associates with heartburn. She has been taking TUMS for heartburn and this has not been working well.   Past Medical History:  Diagnosis Date  . Ectopic pregnancy   . Ruptured right tubal ectopic pregnancy causing hemoperitoneum 07/18/2018    Past Surgical History:  Procedure Laterality Date  . ARTHROSCOPIC REPAIR ACL    . LAPAROSCOPY N/A 07/18/2018   Procedure: LAPAROSCOPY DIAGNOSTIC,right salpingostomy;  Surgeon: Nadara Mustard, MD;  Location: [facility] ORS;  Service: Gynecology;  Laterality: N/A;  ectopic supected to be in blood loss    No current facility-administered medications on file prior to encounter.   Current Outpatient  Medications on File Prior to Encounter  Medication Sig Dispense Refill  . acetaminophen (TYLENOL) 325 MG tablet Take 650 mg by mouth every 6 (six) hours as needed.    . prenatal vitamin w/FE, FA (PRENATAL 1 + 1) 27-1 MG TABS tablet Take 1 tablet by mouth daily at 12 noon.    . ondansetron (ZOFRAN ODT) 4 MG disintegrating tablet Take 1 tablet (4 mg total) by mouth every 6 (six) hours as needed for nausea. (Patient not taking: Reported on 05/03/2019) 20 tablet 0    Allergies  Allergen Reactions  . Chlorhexidine Hives and Other (See Comments)    States she's allergic    Social History   Socioeconomic History  . Marital status: Married    Spouse name: Not on file  . Number of children: Not on file  . Years of education: Not on file  . Highest education level: Not on file  Occupational History  . Not on file  Tobacco Use  . Smoking status: Never Smoker  . Smokeless tobacco: Never Used  Substance and Sexual Activity  . Alcohol use: Never  . Drug use: Never  . Sexual activity: Yes    Birth control/protection: None  Other Topics Concern  . Not on file  Social History Narrative  . Not on file   Social Determinants of Health   Financial Resource Strain:   . Difficulty of Paying Living Expenses: Not on file  Food Insecurity:   . Worried About Programme researcher, broadcasting/film/video in the Last Year: Not on file  . Ran Out of Food in the Last  Year: Not on file  Transportation Needs:   . Lack of Transportation (Medical): Not on file  . Lack of Transportation (Non-Medical): Not on file  Physical Activity:   . Days of Exercise per Week: Not on file  . Minutes of Exercise per Session: Not on file  Stress:   . Feeling of Stress : Not on file  Social Connections:   . Frequency of Communication with Friends and Family: Not on file  . Frequency of Social Gatherings with Friends and Family: Not on file  . Attends Religious Services: Not on file  . Active Member of Clubs or Organizations: Not on file  .  Attends Banker Meetings: Not on file  . Marital Status: Not on file  Intimate Partner Violence:   . Fear of Current or Ex-Partner: Not on file  . Emotionally Abused: Not on file  . Physically Abused: Not on file  . Sexually Abused: Not on file    Family History  Problem Relation Age of Onset  . Diabetes Mellitus I Father   . Heart defect Father        Aortic stenosis (born with it)  . Hypothyroidism Mother      Review of Systems  Constitutional: Negative.   HENT: Negative.   Eyes: Negative.  Negative for blurred vision (non currently).  Respiratory: Negative.   Cardiovascular: Negative.   Gastrointestinal: Positive for heartburn and nausea. Negative for vomiting.  Genitourinary: Negative.   Musculoskeletal: Negative.   Skin: Negative.   Neurological: Positive for headaches. Negative for dizziness (non currently).  Psychiatric/Behavioral: Negative.      Physical Exam: BP 123/75   Pulse 65   Temp 98.4 F (36.9 C) (Oral)   Resp 17   Ht 5\' 8"  (1.727 m)   Wt 114.8 kg   LMP 08/30/2018   BMI 38.47 kg/m   Physical Exam Constitutional:      General: She is not in acute distress.    Appearance: Normal appearance. She is well-developed.  HENT:     Head: Normocephalic and atraumatic.  Eyes:     General: No scleral icterus.    Conjunctiva/sclera: Conjunctivae normal.  Cardiovascular:     Rate and Rhythm: Normal rate and regular rhythm.     Heart sounds: No murmur. No friction rub. No gallop.   Pulmonary:     Effort: Pulmonary effort is normal. No respiratory distress.     Breath sounds: Normal breath sounds. No wheezing or rales.  Abdominal:     General: Bowel sounds are normal. There is no distension.     Palpations: Abdomen is soft. There is no mass.     Tenderness: There is no abdominal tenderness. There is no guarding or rebound.  Musculoskeletal:        General: Normal range of motion.     Cervical back: Normal range of motion and neck supple.   Neurological:     General: No focal deficit present.     Mental Status: She is alert and oriented to person, place, and time.     Cranial Nerves: No cranial nerve deficit.  Skin:    General: Skin is warm and dry.     Findings: No erythema.  Psychiatric:        Mood and Affect: Mood normal.        Behavior: Behavior normal.        Judgment: Judgment normal.     Consults: None  Significant Findings/ Diagnostic Studies:  Lab Results  Component Value Date   WBC 10.0 05/03/2019   HGB 11.3 (L) 05/03/2019   HCT 34.3 (L) 05/03/2019   PLT 168 05/03/2019   CREATININE 0.54 05/03/2019   ALT 20 05/03/2019   AST 22 05/03/2019   PROTCRRATIO Too low to calculate       05/03/2019    Procedures:  NST Baseline FHR: 130 beats/min Variability: moderate Accelerations: present Decelerations: absent Tocometry: quiet  Interpretation:  INDICATIONS: rule out uterine contractions RESULTS:  A NST procedure was performed with FHR monitoring and a normal baseline established, appropriate time of 20-40 minutes of evaluation, and accels >2 seen w 15x15 characteristics.  Results show a REACTIVE NST.    Hospital Course: The patient was admitted to Labor and Delivery Triage for observation. She had her blood pressure cycled every 15 minutes throughout her stay (3 hours).  All blood pressures were normal. Her preeclampsia labs were normal.  For her heartburn she was treated with pepcid.  For her headache she was given Compazine 10 mg PO and Benadryl 50 mg PO in combination.  This did resolve her headache.  Given her normal blood pressures, reactive fetal tracing, normal labs, close follow up (3 days), and resolution of her headache, she was determined to be a good candidate for discharge with precautions to notify us if her headache returns and does not respond to thee combination of medication she took here at the hospital.  Also, if any new symptoms developed that were concerning. She was discharged in  improved condition.  Discharge Condition: improved  Disposition: Discharge disposition: 01-Home or Self Care       Diet: Regular diet  Discharge Activity: Activity as tolerated   Allergies as of 05/03/2019      Reactions   Chlorhexidine Hives, Other (See Comments)   States she's allergic      Medication List    STOP taking these medications   ondansetron 4 MG disintegrating tablet Commonly known as: Zofran ODT     TAKE these medications   acetaminophen 325 MG tablet Commonly known as: TYLENOL Take 650 mg by mouth every 6 (six) hours as needed.   prenatal vitamin w/FE, FA 27-1 MG Tabs tablet Take 1 tablet by mouth daily at 12 noon.   prochlorperazine 10 MG tablet Commonly known as: COMPAZINE Take 1 tablet (10 mg total) by mouth every 8 (eight) hours as needed for nausea or vomiting (refractory headache).        Total time spent taking care of this patient: 30 minutes  Signed: Prentice Docker, MD  05/03/2019, 5:07 PM

## 2019-05-03 NOTE — Discharge Summary (Signed)
See final progress note. 

## 2019-05-03 NOTE — Telephone Encounter (Signed)
Please schedule

## 2019-05-03 NOTE — Discharge Instructions (Signed)
For headache: If Tylenol does not work, take Benadryl (over-the-counter) 25-50 mg (1-2 pills) and compazine (prochlorperazine) 10 mg every 8 hours, as needed.  Remember these medications will make you feel sleepy.  If headache does not go away with this medication, please let us know.  For heartburn: Take Pepcid 20 mg up to twice daily.  You can buy this over the counter.  If this does not help with your heartburn, let us know and we will prescribe something stronger.

## 2019-05-03 NOTE — OB Triage Note (Signed)
Pt presents from [facility] for [facility] evaluation. Pt reports HA for the last week and a half that has got progressively worse. Pt has been taking tylenol regularly since. Pt reports having a dizzy spell yesterday evening along with specs in her vision. She has not had any further vision problems. Reports no epigastric pain or atypical swelling. BP q15 minutes. Monitors applied. Will continue to monitor.

## 2019-05-03 NOTE — Telephone Encounter (Signed)
Patient is schedule at 1:30 today for BP check

## 2019-05-03 NOTE — Progress Notes (Signed)
  Routine Prenatal Care Visit  Subjective  Heather Jacobs is a 24 y.o. G3P1011 at [redacted]w[redacted]d being seen today for ongoing prenatal care.  She is currently monitored for the following issues for this low-risk pregnancy and has Supervision of other normal pregnancy, antepartum; Palpitations; and Atypical chest pain on their problem list.  ----------------------------------------------------------------------------------- Patient reports not feeling well since yesterday with fatigue, nausea, dizziness and headache. She denies visual changes, epigastric pain, fever, body aches, congestion.   Contractions: Not present. Vag. Bleeding: None.  Movement: Present. Leaking Fluid denies.  ----------------------------------------------------------------------------------- The following portions of the patient's history were reviewed and updated as appropriate: allergies, current medications, past family history, past medical history, past social history, past surgical history and problem list. Problem list updated.  Objective  Blood pressure 140/88, weight 253 lb (114.8 kg), last menstrual period 08/30/2018. Pregravid weight 222 lb (100.7 kg) Total Weight Gain 31 lb (14.1 kg)   BP recheck: 142/90  Urinalysis: Urine Protein    Urine Glucose    Fetal Status: Fetal Heart Rate (bpm): 139   Movement: Present     General:  Alert, oriented and cooperative. Patient is in no acute distress.  Skin: Skin is warm and dry. No rash noted.   Cardiovascular: Normal heart rate noted  Respiratory: Normal respiratory effort, no problems with respiration noted  Abdomen: Soft, gravid, appropriate for gestational age. Pain/Pressure: Absent     Pelvic:  Cervical exam deferred        Extremities: Normal range of motion.  Edema: Trace  Mental Status: Normal mood and affect. Normal behavior. Normal judgment and thought content.   Assessment   24 y.o. G3P1011 at [redacted]w[redacted]d by  06/18/2019, by Ultrasound presenting for work-in prenatal  visit  Plan   pregnancy Problems (from 11/12/18 to present)    Problem Noted Resolved   Supervision of other normal pregnancy, antepartum 11/12/2018 by Oswaldo Conroy, CNM No   Overview Addendum 04/11/2019  1:35 PM by Farrel Conners, CNM    Clinic [facility] Prenatal Labs  Dating [place] Blood type: B/Positive/-- (07/28 1505)   Genetic Screen  NIPS: nml XY Antibody:Negative (07/28 1505)  Anatomic [place] Normal, female fetus Rubella: <0.90 (07/28 1505) Varicella:  Immune  GTT   Third trimester: 125 RPR: Non Reactive (07/28 1505)   Rhogam na HBsAg: Negative (07/28 1505)   TDaP vaccine  04/06/2019        Flu Shot:04/06/2019 HIV: Non Reactive (07/28 1505)   Baby Food    Breast                            GBS:   Contraception  IUD SEG:[id number]  CBB  no   CS/VBAC n/a   Support Person                  Preterm labor symptoms and general obstetric precautions including but not limited to vaginal bleeding, contractions, leaking of fluid and fetal movement were reviewed in detail with the patient.  Recommend patient go to L&D for [facility] evaluation    Return for follow up scheduled.  Tresea Mall, CNM 05/03/2019 1:38 PM

## 2019-05-06 ENCOUNTER — Encounter: Payer: Self-pay | Admitting: Obstetrics & Gynecology

## 2019-05-06 ENCOUNTER — Ambulatory Visit (INDEPENDENT_AMBULATORY_CARE_PROVIDER_SITE_OTHER): Payer: Medicaid Other | Admitting: Obstetrics & Gynecology

## 2019-05-06 ENCOUNTER — Other Ambulatory Visit: Payer: Self-pay

## 2019-05-06 VITALS — BP 130/80 | Wt 254.0 lb

## 2019-05-06 DIAGNOSIS — Z3483 Encounter for supervision of other normal pregnancy, third trimester: Secondary | ICD-10-CM

## 2019-05-06 DIAGNOSIS — Z3A33 33 weeks gestation of pregnancy: Secondary | ICD-10-CM

## 2019-05-06 DIAGNOSIS — Z3480 Encounter for supervision of other normal pregnancy, unspecified trimester: Secondary | ICD-10-CM

## 2019-05-06 LAB — POCT URINALYSIS DIPSTICK OB
Glucose, UA: NEGATIVE
POC,PROTEIN,UA: NEGATIVE

## 2019-05-06 NOTE — Patient Instructions (Signed)

## 2019-05-06 NOTE — Progress Notes (Signed)
  Subjective  Fetal Movement? yes Contractions? no Leaking Fluid? no Vaginal Bleeding? no No h/a, blurry vision, RUQ pain, CP, SOB Objective  BP 130/80   Wt 254 lb (115.2 kg)   LMP 08/30/2018   BMI 38.62 kg/m  General: NAD Pumonary: no increased work of breathing Abdomen: gravid, non-tender Extremities: no edema Psychiatric: mood appropriate, affect full  Assessment  23 y.o. G3P1011 at [redacted]w[redacted]d by  06/18/2019, by Ultrasound presenting for routine prenatal visit  Plan   Problem List Items Addressed This Visit      Other   Supervision of other normal pregnancy, antepartum    Other Visit Diagnoses    [redacted] weeks gestation of pregnancy    -  Primary      pregnancy Problems (from 11/12/18 to present)    Problem Noted Resolved   Supervision of other normal pregnancy, antepartum 11/12/2018 by Oswaldo Conroy, CNM No   Overview Addendum 04/11/2019  1:35 PM by Farrel Conners, CNM    Clinic [facility] Prenatal Labs  Dating [place] Blood type: B/Positive/-- (07/28 1505)   Genetic Screen  NIPS: nml XY Antibody:Negative (07/28 1505)  Anatomic [place] Normal, female fetus Rubella: <0.90 (07/28 1505) Varicella:  Immune  GTT   Third trimester: 125 RPR: Non Reactive (07/28 1505)   Rhogam na HBsAg: Negative (07/28 1505)   TDaP vaccine  04/06/2019        Flu Shot:04/06/2019 HIV: Non Reactive (07/28 1505)   Baby Food    Breast                            GBS:   Contraception  IUD YBW:[id number]  CBB  no   CS/VBAC n/a   Support Person                BP normal, no proteinuria  Monitor for s/sx preeclampsia  PNV  Baby ASA  FMC  PTL precautions  Annamarie Major, MD, [facility], [facility] 05/06/2019  2:05 PM

## 2019-05-09 ENCOUNTER — Observation Stay
Admission: EM | Admit: 2019-05-09 | Discharge: 2019-05-09 | Disposition: A | Discharge status: Home / Self Care | Payer: Medicaid Other | Attending: Obstetrics and Gynecology | Admitting: Obstetrics and Gynecology

## 2019-05-09 ENCOUNTER — Other Ambulatory Visit: Payer: Self-pay

## 2019-05-09 ENCOUNTER — Encounter: Payer: Self-pay | Admitting: Obstetrics and Gynecology

## 2019-05-09 DIAGNOSIS — Z3480 Encounter for supervision of other normal pregnancy, unspecified trimester: Secondary | ICD-10-CM

## 2019-05-09 DIAGNOSIS — Z3A34 34 weeks gestation of pregnancy: Secondary | ICD-10-CM | POA: Diagnosis not present

## 2019-05-09 DIAGNOSIS — Z3490 Encounter for supervision of normal pregnancy, unspecified, unspecified trimester: Secondary | ICD-10-CM

## 2019-05-09 DIAGNOSIS — R030 Elevated blood-pressure reading, without diagnosis of hypertension: Secondary | ICD-10-CM | POA: Insufficient documentation

## 2019-05-09 DIAGNOSIS — O26893 Other specified pregnancy related conditions, third trimester: Secondary | ICD-10-CM | POA: Diagnosis present

## 2019-05-09 NOTE — OB Triage Note (Signed)
Pt is G3P1 and [redacted]w[redacted]d presenting to L&D with c/o elevated blood pressure taken at work. Pt states her BP was 153/83 at 2326 on 05/08/19. Pt denies HA, blurry vision and epigastic pain. Pt also denies LOF and VB. FHT 130bpm. Pt confirms positive fetal movement. BP upon arrival is 125/82 and vital signs WDL. Pt denies any further needs at this time.

## 2019-05-09 NOTE — Discharge Instructions (Signed)
LABOR: When contractions begin, you should start to time them from the beginning of one contraction to the beginning of the next.  When contractions are 5-10 minutes apart or less and have been regular for at least an hour, you should call your health care provider.  Notify your doctor if any of the following occur: 1. Bleeding from the vagina 7. Sudden, constant, or occasional abdominal pain  2. Pain or burning when urinating 8. Sudden gushing of fluid from the vagina (with or without continued leaking)  3. Chills or fever 9. Fainting spells, "black outs" or loss of consciousness  4. Increase in vaginal discharge 10. Severe or continued nausea or vomiting  5. Pelvic pressure (sudden increase) 11. Blurring of vision or spots before the eyes  6. Baby moving less than usual 12. Leaking of fluid    FETAL KICK COUNT: Lie on your left side for one hour after a meal, and count the number of times your baby kicks. If it is less than 5 times, get up, move around and drink some juice. Repeat the test 30 minutes later. If it is still less than 5 kicks in an hour, notify your doctor.Hypertension During Pregnancy Hypertension is also called high blood pressure. High blood pressure means that the force of your blood moving in your body is too strong. It can cause problems for you and your baby. Different types of high blood pressure can happen during pregnancy. The types are:  High blood pressure before you got pregnant. This is called chronic hypertension.  This can continue during your pregnancy. Your doctor will want to keep checking your blood pressure. You may need medicine to keep your blood pressure under control while you are pregnant. You will need follow-up visits after you have your baby.  High blood pressure that goes up during pregnancy when it was normal before. This is called gestational hypertension. It will usually get better after you have your baby, but your doctor will need to watch your  blood pressure to make sure that it is getting better.  Very high blood pressure during pregnancy. This is called preeclampsia. Very high blood pressure is an emergency that needs to be checked and treated right away.  You may develop very high blood pressure after giving birth. This is called postpartum preeclampsia. This usually occurs within 48 hours after childbirth but may occur up to 6 weeks after giving birth. This is rare. How does this affect me? If you have high blood pressure during pregnancy, you have a higher chance of developing high blood pressure:  As you get older.  If you get pregnant again. In some cases, high blood pressure during pregnancy can cause:  Stroke.  Heart attack.  Damage to the kidneys, lungs, or liver.  Preeclampsia.  Jerky movements you cannot control (convulsions or seizures).  Problems with the placenta. How does this affect my baby? Your baby may:  Be born early.  Not weigh as much as he or she should.  Not handle labor well, leading to a c-section birth. What are the risks?  Having high blood pressure during a past pregnancy.  Being overweight.  Being 63 years old or older.  Being pregnant for the first time.  Being pregnant with more than one baby.  Becoming pregnant using fertility methods, such as IVF.  Having other problems, such as diabetes, or kidney disease.  Having family members who have high blood pressure. What can I do to lower my risk?  Keep a healthy weight.  Eat a healthy diet.  Follow what your doctor tells you about treating any medical problems that you had before becoming pregnant. It is very important to go to all of your doctor visits. Your doctor will check your blood pressure and make sure that your pregnancy is progressing as it should. Treatment should start early if a problem is found. How is this treated? Treatment for high blood pressure during pregnancy can differ depending on the type of  high blood pressure you have and how serious it is.  You may need to take blood pressure medicine.  If you have been taking medicine for your blood pressure, you may need to change the medicine during pregnancy if it is not safe for your baby.  If your doctor thinks that you could get very high blood pressure, he or she may tell you to take a low-dose aspirin during your pregnancy.  If you have very high blood pressure, you may need to stay in the hospital so you and your baby can be watched closely. You may also need to take medicine to lower your blood pressure. This medicine may be given by mouth or through an IV tube.  In some cases, if your condition gets worse, you may need to have your baby early. Follow these instructions at home: Eating and drinking   Drink enough fluid to keep your pee (urine) pale yellow.  Avoid caffeine. Lifestyle  Do not use any products that contain nicotine or tobacco, such as cigarettes, e-cigarettes, and chewing tobacco. If you need help quitting, ask your doctor.  Do not use alcohol or drugs.  Avoid stress.  Rest and get plenty of sleep.  Regular exercise can help. Ask your doctor what kinds of exercise are best for you. General instructions  Take over-the-counter and prescription medicines only as told by your doctor.  Keep all prenatal and follow-up visits as told by your doctor. This is important. Contact a doctor if:  You have symptoms that your doctor told you to watch for, such as: ? Headaches. ? Nausea. ? Vomiting. ? Belly (abdominal) pain. ? Dizziness. ? Light-headedness. Get help right away if:  You have: ? Very bad belly pain that does not get better with treatment. ? A very bad headache that does not get better. ? Vomiting that does not get better. ? Sudden, fast weight gain. ? Sudden swelling in your hands, ankles, or face. ? Bleeding from your vagina. ? Blood in your pee. ? Blurry vision. ? Double  vision. ? Shortness of breath. ? Chest pain. ? Weakness on one side of your body. ? Trouble talking.  Your baby is not moving as much as usual. Summary  High blood pressure is also called hypertension.  High blood pressure means that the force of your blood moving in your body is too strong.  High blood pressure can cause problems for you and your baby.  Keep all follow-up visits as told by your doctor. This is important. This information is not intended to replace advice given to you by your health care provider. Make sure you discuss any questions you have with your health care provider. Document Revised: 08/05/2018 Document Reviewed: 05/11/2018 Elsevier Patient Education  2020 [employer].

## 2019-05-11 NOTE — Discharge Summary (Signed)
Patient is a G43P1011 female at [redacted]w[redacted]d who presents for elevated blood pressure at home. She had no other complaints. Per RN, patient noted +FM, no LOF, and no vaginal bleeding.   She had serial blood pressures taken every 15 minutes for 2 hours.  All were well within the normal range. Her fetal tracing was reviewed by me and was reactive over the duration of her stay.  Given her normal blood pressure and no symptoms, she was discharged with routine follow up.  Thomasene Mohair, MD, [facility], [facility] 05/11/2019 4:48 PM

## 2019-05-13 ENCOUNTER — Other Ambulatory Visit: Payer: Self-pay

## 2019-05-13 ENCOUNTER — Ambulatory Visit (INDEPENDENT_AMBULATORY_CARE_PROVIDER_SITE_OTHER): Payer: Medicaid Other | Admitting: Obstetrics & Gynecology

## 2019-05-13 ENCOUNTER — Encounter: Payer: Self-pay | Admitting: Obstetrics & Gynecology

## 2019-05-13 VITALS — BP 130/80 | Wt 256.0 lb

## 2019-05-13 DIAGNOSIS — O163 Unspecified maternal hypertension, third trimester: Secondary | ICD-10-CM

## 2019-05-13 DIAGNOSIS — Z3480 Encounter for supervision of other normal pregnancy, unspecified trimester: Secondary | ICD-10-CM

## 2019-05-13 DIAGNOSIS — Z3A34 34 weeks gestation of pregnancy: Secondary | ICD-10-CM

## 2019-05-13 LAB — POCT URINALYSIS DIPSTICK OB
Glucose, UA: NEGATIVE
POC,PROTEIN,UA: NEGATIVE

## 2019-05-13 NOTE — Patient Instructions (Signed)
Third Trimester of Pregnancy The third trimester is from week 28 through week 40 (months 7 through 9). The third trimester is a time when the unborn baby (fetus) is growing rapidly. At the end of the ninth month, the fetus is about 20 inches in length and weighs 6-10 pounds. Body changes during your third trimester Your body will continue to go through many changes during pregnancy. The changes vary from woman to woman. During the third trimester:  Your weight will continue to increase. You can expect to gain 25-35 pounds (11-16 kg) by the end of the pregnancy.  You may begin to get stretch marks on your hips, abdomen, and breasts.  You may urinate more often because the fetus is moving lower into your pelvis and pressing on your bladder.  You may develop or continue to have heartburn. This is caused by increased hormones that slow down muscles in the digestive tract.  You may develop or continue to have constipation because increased hormones slow digestion and cause the muscles that push waste through your intestines to relax.  You may develop hemorrhoids. These are swollen veins (varicose veins) in the rectum that can itch or be painful.  You may develop swollen, bulging veins (varicose veins) in your legs.  You may have increased body aches in the pelvis, back, or thighs. This is due to weight gain and increased hormones that are relaxing your joints.  You may have changes in your hair. These can include thickening of your hair, rapid growth, and changes in texture. Some women also have hair loss during or after pregnancy, or hair that feels dry or thin. Your hair will most likely return to normal after your baby is born.  Your breasts will continue to grow and they will continue to become tender. A yellow fluid (colostrum) may leak from your breasts. This is the first milk you are producing for your baby.  Your belly button may stick out.  You may notice more swelling in your hands,  face, or ankles.  You may have increased tingling or numbness in your hands, arms, and legs. The skin on your belly may also feel numb.  You may feel short of breath because of your expanding uterus.  You may have more problems sleeping. This can be caused by the size of your belly, increased need to urinate, and an increase in your body's metabolism.  You may notice the fetus "dropping," or moving lower in your abdomen (lightening).  You may have increased vaginal discharge.  You may notice your joints feel loose and you may have pain around your pelvic bone. What to expect at prenatal visits You will have prenatal exams every 2 weeks until week 36. Then you will have weekly prenatal exams. During a routine prenatal visit:  You will be weighed to make sure you and the baby are growing normally.  Your blood pressure will be taken.  Your abdomen will be measured to track your baby's growth.  The fetal heartbeat will be listened to.  Any test results from the previous visit will be discussed.  You may have a cervical check near your due date to see if your cervix has softened or thinned (effaced).  You will be tested for Group B streptococcus. This happens between 35 and 37 weeks. Your health care provider may ask you:  What your birth plan is.  How you are feeling.  If you are feeling the baby move.  If you have had any abnormal   symptoms, such as leaking fluid, bleeding, severe headaches, or abdominal cramping.  If you are using any tobacco products, including cigarettes, chewing tobacco, and electronic cigarettes.  If you have any questions. Other tests or screenings that may be performed during your third trimester include:  Blood tests that check for low iron levels (anemia).  Fetal testing to check the health, activity level, and growth of the fetus. Testing is done if you have certain medical conditions or if there are problems during the pregnancy.  Nonstress test  (NST). This test checks the health of your baby to make sure there are no signs of problems, such as the baby not getting enough oxygen. During this test, a belt is placed around your belly. The baby is made to move, and its heart rate is monitored during movement. What is false labor? False labor is a condition in which you feel small, irregular tightenings of the muscles in the womb (contractions) that usually go away with rest, changing position, or drinking water. These are called Braxton Hicks contractions. Contractions may last for hours, days, or even weeks before true labor sets in. If contractions come at regular intervals, become more frequent, increase in intensity, or become painful, you should see your health care provider. What are the signs of labor?  Abdominal cramps.  Regular contractions that start at 10 minutes apart and become stronger and more frequent with time.  Contractions that start on the top of the uterus and spread down to the lower abdomen and back.  Increased pelvic pressure and dull back pain.  A watery or bloody mucus discharge that comes from the vagina.  Leaking of amniotic fluid. This is also known as your "water breaking." It could be a slow trickle or a gush. Let your health care provider know if it has a color or strange odor. If you have any of these signs, call your health care provider right away, even if it is before your due date. Follow these instructions at home: Medicines  Follow your health care provider's instructions regarding medicine use. Specific medicines may be either safe or unsafe to take during pregnancy.  Take a prenatal vitamin that contains at least 600 micrograms (mcg) of folic acid.  If you develop constipation, try taking a stool softener if your health care provider approves. Eating and drinking   Eat a balanced diet that includes fresh fruits and vegetables, whole grains, good sources of protein such as meat, eggs, or tofu,  and low-fat dairy. Your health care provider will help you determine the amount of weight gain that is right for you.  Avoid raw meat and uncooked cheese. These carry germs that can cause birth defects in the baby.  If you have low calcium intake from food, talk to your health care provider about whether you should take a daily calcium supplement.  Eat four or five small meals rather than three large meals a day.  Limit foods that are high in fat and processed sugars, such as fried and sweet foods.  To prevent constipation: ? Drink enough fluid to keep your urine clear or pale yellow. ? Eat foods that are high in fiber, such as fresh fruits and vegetables, whole grains, and beans. Activity  Exercise only as directed by your health care provider. Most women can continue their usual exercise routine during pregnancy. Try to exercise for 30 minutes at least 5 days a week. Stop exercising if you experience uterine contractions.  Avoid heavy lifting.  Do   not exercise in extreme heat or humidity, or at high altitudes.  Wear low-heel, comfortable shoes.  Practice good posture.  You may continue to have sex unless your health care provider tells you otherwise. Relieving pain and discomfort  Take frequent breaks and rest with your legs elevated if you have leg cramps or low back pain.  Take warm sitz baths to soothe any pain or discomfort caused by hemorrhoids. Use hemorrhoid cream if your health care provider approves.  Wear a good support bra to prevent discomfort from breast tenderness.  If you develop varicose veins: ? Wear support pantyhose or compression stockings as told by your healthcare provider. ? Elevate your feet for 15 minutes, 3-4 times a day. Prenatal care  Write down your questions. Take them to your prenatal visits.  Keep all your prenatal visits as told by your health care provider. This is important. Safety  Wear your seat belt at all times when driving.  Make  a list of emergency phone numbers, including numbers for family, friends, the hospital, and police and fire departments. General instructions  Avoid cat litter boxes and soil used by cats. These carry germs that can cause birth defects in the baby. If you have a cat, ask someone to clean the litter box for you.  Do not travel far distances unless it is absolutely necessary and only with the approval of your health care provider.  Do not use hot tubs, steam rooms, or saunas.  Do not drink alcohol.  Do not use any products that contain nicotine or tobacco, such as cigarettes and e-cigarettes. If you need help quitting, ask your health care provider.  Do not use any medicinal herbs or unprescribed drugs. These chemicals affect the formation and growth of the baby.  Do not douche or use tampons or scented sanitary pads.  Do not cross your legs for long periods of time.  To prepare for the arrival of your baby: ? Take prenatal classes to understand, practice, and ask questions about labor and delivery. ? Make a trial run to the hospital. ? Visit the hospital and tour the maternity area. ? Arrange for maternity or paternity leave through employers. ? Arrange for family and friends to take care of pets while you are in the hospital. ? Purchase a rear-facing car seat and make sure you know how to install it in your car. ? Pack your hospital bag. ? Prepare the baby's nursery. Make sure to remove all pillows and stuffed animals from the baby's crib to prevent suffocation.  Visit your dentist if you have not gone during your pregnancy. Use a soft toothbrush to brush your teeth and be gentle when you floss. Contact a health care provider if:  You are unsure if you are in labor or if your water has broken.  You become dizzy.  You have mild pelvic cramps, pelvic pressure, or nagging pain in your abdominal area.  You have lower back pain.  You have persistent nausea, vomiting, or  diarrhea.  You have an unusual or bad smelling vaginal discharge.  You have pain when you urinate. Get help right away if:  Your water breaks before 37 weeks.  You have regular contractions less than 5 minutes apart before 37 weeks.  You have a fever.  You are leaking fluid from your vagina.  You have spotting or bleeding from your vagina.  You have severe abdominal pain or cramping.  You have rapid weight loss or weight gain.  You have   shortness of breath with chest pain.  You notice sudden or extreme swelling of your face, hands, ankles, feet, or legs.  Your baby makes fewer than 10 movements in 2 hours.  You have severe headaches that do not go away when you take medicine.  You have vision changes. Summary  The third trimester is from week 28 through week 40, months 7 through 9. The third trimester is a time when the unborn baby (fetus) is growing rapidly.  During the third trimester, your discomfort may increase as you and your baby continue to gain weight. You may have abdominal, leg, and back pain, sleeping problems, and an increased need to urinate.  During the third trimester your breasts will keep growing and they will continue to become tender. A yellow fluid (colostrum) may leak from your breasts. This is the first milk you are producing for your baby.  False labor is a condition in which you feel small, irregular tightenings of the muscles in the womb (contractions) that eventually go away. These are called Braxton Hicks contractions. Contractions may last for hours, days, or even weeks before true labor sets in.  Signs of labor can include: abdominal cramps; regular contractions that start at 10 minutes apart and become stronger and more frequent with time; watery or bloody mucus discharge that comes from the vagina; increased pelvic pressure and dull back pain; and leaking of amniotic fluid. This information is not intended to replace advice given to you by your  health care provider. Make sure you discuss any questions you have with your health care provider. Document Revised: 08/05/2018 Document Reviewed: 05/20/2016 Elsevier Patient Education  2020 [employer]  

## 2019-05-13 NOTE — Progress Notes (Signed)
  Subjective  Fetal Movement? yes Contractions? no Leaking Fluid? no Vaginal Bleeding? no No h/a, blurry vision, RUQ pain, CP, SOB Objective  BP 130/80   Wt 256 lb (116.1 kg)   LMP 08/30/2018   BMI 38.92 kg/m  General: NAD Pumonary: no increased work of breathing Abdomen: gravid, non-tender Extremities: no edema Psychiatric: mood appropriate, affect full  Assessment  24 y.o. G3P1011 at [redacted]w[redacted]d by  06/18/2019, by Ultrasound presenting for routine prenatal visit  Plan   Problem List Items Addressed This Visit      Other   Supervision of other normal pregnancy, antepartum   Elevated blood pressure affecting pregnancy in third trimester, antepartum   Pregnancy - Primary      pregnancy Problems (from 11/12/18 to present)    Problem Noted Resolved   Supervision of other normal pregnancy, antepartum 11/12/2018 by Oswaldo Conroy, CNM No   Overview Addendum 04/11/2019  1:35 PM by Farrel Conners, CNM    Clinic [facility] Prenatal Labs  Dating [place] Blood type: B/Positive/-- (07/28 1505)   Genetic Screen  NIPS: nml XY Antibody:Negative (07/28 1505)  Anatomic [place] Normal, female fetus Rubella: <0.90 (07/28 1505) Varicella:  Immune  GTT   Third trimester: 125 RPR: Non Reactive (07/28 1505)   Rhogam na HBsAg: Negative (07/28 1505)   TDaP vaccine  04/06/2019        Flu Shot:04/06/2019 HIV: Non Reactive (07/28 1505)   Baby Food    Breast                            GBS:   Contraception  IUD PJA:[id number]  CBB  no   CS/VBAC n/a   Support Person                BP up and down at home and work, better [employer], stop work, risks explained  PNV, [facility]  PTL precautions  Annamarie Major, MD, [facility], [facility] 05/13/2019  3:19 PM

## 2019-05-13 NOTE — Addendum Note (Signed)
Addended by: Cornelius Moras D on: 05/13/2019 03:25 PM   Modules accepted: Orders

## 2019-05-20 ENCOUNTER — Other Ambulatory Visit (HOSPITAL_COMMUNITY)
Admission: RE | Admit: 2019-05-20 | Discharge: 2019-05-20 | Disposition: A | Discharge status: Home / Self Care | Payer: Medicaid Other | Source: Ambulatory Visit | Attending: Obstetrics and Gynecology | Admitting: Obstetrics and Gynecology

## 2019-05-20 ENCOUNTER — Ambulatory Visit (INDEPENDENT_AMBULATORY_CARE_PROVIDER_SITE_OTHER): Payer: Medicaid Other | Admitting: Obstetrics and Gynecology

## 2019-05-20 ENCOUNTER — Encounter: Payer: Self-pay | Admitting: Obstetrics and Gynecology

## 2019-05-20 ENCOUNTER — Other Ambulatory Visit: Payer: Self-pay

## 2019-05-20 VITALS — BP 140/82 | Wt 254.0 lb

## 2019-05-20 DIAGNOSIS — Z3480 Encounter for supervision of other normal pregnancy, unspecified trimester: Secondary | ICD-10-CM | POA: Diagnosis not present

## 2019-05-20 DIAGNOSIS — O133 Gestational [pregnancy-induced] hypertension without significant proteinuria, third trimester: Secondary | ICD-10-CM | POA: Insufficient documentation

## 2019-05-20 DIAGNOSIS — Z3A35 35 weeks gestation of pregnancy: Secondary | ICD-10-CM

## 2019-05-20 DIAGNOSIS — O163 Unspecified maternal hypertension, third trimester: Secondary | ICD-10-CM

## 2019-05-20 LAB — POCT URINALYSIS DIPSTICK OB
Glucose, UA: NEGATIVE
POC,PROTEIN,UA: NEGATIVE

## 2019-05-20 LAB — OB RESULTS CONSOLE GC/CHLAMYDIA: Gonorrhea: NEGATIVE

## 2019-05-20 LAB — OB RESULTS CONSOLE GBS: GBS: NEGATIVE

## 2019-05-20 NOTE — Patient Instructions (Addendum)
Pain Relief During Labor and Delivery Many things can cause pain during labor and delivery, including:  Pressure on bones and ligaments due to the baby moving through the pelvis.  Stretching of tissues due to the baby moving through the birth canal.  Muscle tension due to anxiety or nervousness.  The uterus tightening (contracting) and relaxing to help move the baby. There are many ways to deal with the pain of labor and delivery. They include:  Taking prenatal classes. Taking these classes helps you know what to expect during your baby's birth. What you learn will increase your confidence and decrease your anxiety.  Practicing relaxation techniques or doing relaxing activities, such as: ? Focused breathing. ? Meditation. ? Visualization. ? Aroma therapy. ? Listening to your favorite music. ? Hypnosis.  Taking a warm shower or bath (hydrotherapy). This may: ? Provide comfort and relaxation. ? Lessen your perception of pain. ? Decrease the amount of pain medicine needed. ? Decrease the length of labor.  Getting a massage or counterpressure on your back.  Applying warm packs or ice packs.  Changing positions often, moving around, or using a birthing ball.  Getting: ? Pain medicine through an IV or injection into a muscle. ? Pain medicine inserted into your spinal column. ? Injections of sterile water just under the skin on your lower back (intradermal injections). ? Laughing gas (nitrous oxide). Discuss your pain control options with your health care provider during your prenatal visits. Explore the options offered by your hospital or birth center. What kinds of medicine are available? There are two kinds of medicines that can be used to relieve pain during labor and delivery:  Analgesics. These medicines decrease pain without causing you to lose feeling or the ability to move your muscles.  Anesthetics. These medicines block feeling in the body and can decrease your  ability to move freely. Both of these kinds of medicine can cause minor side effects, such as nausea, trouble concentrating, and sleepiness. They can also decrease the baby's heart rate before birth and affect the baby's breathing rate after birth. For this reason, health care providers are careful about when and how much medicine is given. What are specific medicines and procedures that provide pain relief? Local Anesthetics Local anesthetics are used to numb a small area of the body. They may be used along with another kind of anesthetic or used to numb the nerves of the vagina, cervix, and perineum during the second stage of labor. General Anesthetics General anesthetics cause you to lose consciousness so you do not feel pain. They are usually only used for an emergency cesarean delivery. General anesthetics are given through an IV tube and a mask. Pudendal Block A pudendal block is a form of local anesthetic. It may be used to relieve the pain associated with pushing or stretching of the perineum at the time of delivery or to further numb the perineum. A pudendal block is done by injecting numbing medicine through the vaginal wall into a nerve in the pelvis. Epidural Analgesia Epidural analgesia is given through a flexible IV catheter that is inserted into the lower back. Numbing medicine is delivered continuously to the area near your spinal column nerves (epidural space). After having this type of analgesia, you may be able to move your legs but you most likely will not be able to walk. Depending on the amount of medicine given, you may lose all feeling in the lower half of your body, or you may retain some level   of sensation, including the urge to push. Epidural analgesia can be used to provide pain relief for a vaginal birth. Spinal Block A spinal block is similar to epidural analgesia, but the medicine is injected into the spinal fluid instead of the epidural space. A spinal block is only given  once. It starts to relieve pain quickly, but the pain relief lasts only 1-6 hours. Spinal blocks can be used for cesarean deliveries. Combined Spinal-Epidural (CSE) Block A CSE block combines the effects of a spinal block and epidural analgesia. The spinal block works quickly to block all pain. The epidural analgesia provides continuous pain relief, even after the effects of the spinal block have worn off. This information is not intended to replace advice given to you by your health care provider. Make sure you discuss any questions you have with your health care provider. Document Revised: 03/27/2017 Document Reviewed: 09/05/2015 Elsevier Patient Education  2020 [employer] Vaginal Delivery  Vaginal delivery means that you give birth by pushing your baby out of your birth canal (vagina). A team of health care providers will help you before, during, and after vaginal delivery. Birth experiences are unique for every woman and every pregnancy, and birth experiences vary depending on where you choose to give birth. What happens when I arrive at the birth center or hospital? Once you are in labor and have been admitted into the hospital or birth center, your health care provider may:  Review your pregnancy history and any concerns that you have.  Insert an IV into one of your veins. This may be used to give you fluids and medicines.  Check your blood pressure, pulse, temperature, and heart rate (vital signs).  Check whether your bag of water (amniotic sac) has broken (ruptured).  Talk with you about your birth plan and discuss pain control options. Monitoring Your health care provider may monitor your contractions (uterine monitoring) and your baby's heart rate (fetal monitoring). You may need to be monitored:  Often, but not continuously (intermittently).  All the time or for long periods at a time (continuously). Continuous monitoring may be needed if: ? You are taking certain medicines,  such as medicine to relieve pain or make your contractions stronger. ? You have pregnancy or labor complications. Monitoring may be done by:  Placing a special stethoscope or a handheld monitoring device on your abdomen to check your baby's heartbeat and to check for contractions.  Placing monitors on your abdomen (external monitors) to record your baby's heartbeat and the frequency and length of contractions.  Placing monitors inside your uterus through your vagina (internal monitors) to record your baby's heartbeat and the frequency, length, and strength of your contractions. Depending on the type of monitor, it may remain in your uterus or on your baby's head until birth.  Telemetry. This is a type of continuous monitoring that can be done with external or internal monitors. Instead of having to stay in bed, you are able to move around during telemetry. Physical exam Your health care provider may perform frequent physical exams. This may include:  Checking how and where your baby is positioned in your uterus.  Checking your cervix to determine: ? Whether it is thinning out (effacing). ? Whether it is opening up (dilating). What happens during labor and delivery?  Normal labor and delivery is divided into the following three stages: Stage 1  This is the longest stage of labor.  This stage can last for hours or days.  Throughout this stage,   you will feel contractions. Contractions generally feel mild, infrequent, and irregular at first. They get stronger, more frequent (about every 2-3 minutes), and more regular as you move through this stage.  This stage ends when your cervix is completely dilated to 4 inches (10 cm) and completely effaced. Stage 2  This stage starts once your cervix is completely effaced and dilated and lasts until the delivery of your baby.  This stage may last from 20 minutes to 2 hours.  This is the stage where you will feel an urge to push your baby out of  your vagina.  You may feel stretching and burning pain, especially when the widest part of your baby's head passes through the vaginal opening (crowning).  Once your baby is delivered, the umbilical cord will be clamped and cut. This usually occurs after waiting a period of 1-2 minutes after delivery.  Your baby will be placed on your bare chest (skin-to-skin contact) in an upright position and covered with a warm blanket. Watch your baby for feeding cues, like rooting or sucking, and help the baby to your breast for his or her first feeding. Stage 3  This stage starts immediately after the birth of your baby and ends after you deliver the placenta.  This stage may take anywhere from 5 to 30 minutes.  After your baby has been delivered, you will feel contractions as your body expels the placenta and your uterus contracts to control bleeding. What can I expect after labor and delivery?  After labor is over, you and your baby will be monitored closely until you are ready to go home to ensure that you are both healthy. Your health care team will teach you how to care for yourself and your baby.  You and your baby will stay in the same room (rooming in) during your hospital stay. This will encourage early bonding and successful breastfeeding.  You may continue to receive fluids and medicines through an IV.  Your uterus will be checked and massaged regularly (fundal massage).  You will have some soreness and pain in your abdomen, vagina, and the area of skin between your vaginal opening and your anus (perineum).  If an incision was made near your vagina (episiotomy) or if you had some vaginal tearing during delivery, cold compresses may be placed on your episiotomy or your tear. This helps to reduce pain and swelling.  You may be given a squirt bottle to use instead of wiping when you go to the bathroom. To use the squirt bottle, follow these steps: ? Before you urinate, fill the squirt  bottle with warm water. Do not use hot water. ? After you urinate, while you are sitting on the toilet, use the squirt bottle to rinse the area around your urethra and vaginal opening. This rinses away any urine and blood. ? Fill the squirt bottle with clean water every time you use the bathroom.  It is normal to have vaginal bleeding after delivery. Wear a sanitary pad for vaginal bleeding and discharge. Summary  Vaginal delivery means that you will give birth by pushing your baby out of your birth canal (vagina).  Your health care provider may monitor your contractions (uterine monitoring) and your baby's heart rate (fetal monitoring).  Your health care provider may perform a physical exam.  Normal labor and delivery is divided into three stages.  After labor is over, you and your baby will be monitored closely until you are ready to go home. This information  is not intended to replace advice given to you by your health care provider. Make sure you discuss any questions you have with your health care provider. Document Revised: 05/19/2017 Document Reviewed: 05/19/2017 Elsevier Patient Education  2020 [employer].  Hypertension During Pregnancy Hypertension is also called high blood pressure. High blood pressure means that the force of your blood moving in your body is too strong. It can cause problems for you and your baby. Different types of high blood pressure can happen during pregnancy. The types are:  High blood pressure before you got pregnant. This is called chronic hypertension.  This can continue during your pregnancy. Your doctor will want to keep checking your blood pressure. You may need medicine to keep your blood pressure under control while you are pregnant. You will need follow-up visits after you have your baby.  High blood pressure that goes up during pregnancy when it was normal before. This is called gestational hypertension. It will usually get better after you have  your baby, but your doctor will need to watch your blood pressure to make sure that it is getting better.  Very high blood pressure during pregnancy. This is called preeclampsia. Very high blood pressure is an emergency that needs to be checked and treated right away.  You may develop very high blood pressure after giving birth. This is called postpartum preeclampsia. This usually occurs within 48 hours after childbirth but may occur up to 6 weeks after giving birth. This is rare. How does this affect me? If you have high blood pressure during pregnancy, you have a higher chance of developing high blood pressure:  As you get older.  If you get pregnant again. In some cases, high blood pressure during pregnancy can cause:  Stroke.  Heart attack.  Damage to the kidneys, lungs, or liver.  Preeclampsia.  Jerky movements you cannot control (convulsions or seizures).  Problems with the placenta. How does this affect my baby? Your baby may:  Be born early.  Not weigh as much as he or she should.  Not handle labor well, leading to a c-section birth. What are the risks?  Having high blood pressure during a past pregnancy.  Being overweight.  Being 3 years old or older.  Being pregnant for the first time.  Being pregnant with more than one baby.  Becoming pregnant using fertility methods, such as IVF.  Having other problems, such as diabetes, or kidney disease.  Having family members who have high blood pressure. What can I do to lower my risk?   Keep a healthy weight.  Eat a healthy diet.  Follow what your doctor tells you about treating any medical problems that you had before becoming pregnant. It is very important to go to all of your doctor visits. Your doctor will check your blood pressure and make sure that your pregnancy is progressing as it should. Treatment should start early if a problem is found. How is this treated? Treatment for high blood pressure  during pregnancy can differ depending on the type of high blood pressure you have and how serious it is.  You may need to take blood pressure medicine.  If you have been taking medicine for your blood pressure, you may need to change the medicine during pregnancy if it is not safe for your baby.  If your doctor thinks that you could get very high blood pressure, he or she may tell you to take a low-dose aspirin during your pregnancy.  If  you have very high blood pressure, you may need to stay in the hospital so you and your baby can be watched closely. You may also need to take medicine to lower your blood pressure. This medicine may be given by mouth or through an IV tube.  In some cases, if your condition gets worse, you may need to have your baby early. Follow these instructions at home: Eating and drinking   Drink enough fluid to keep your pee (urine) pale yellow.  Avoid caffeine. Lifestyle  Do not use any products that contain nicotine or tobacco, such as cigarettes, e-cigarettes, and chewing tobacco. If you need help quitting, ask your doctor.  Do not use alcohol or drugs.  Avoid stress.  Rest and get plenty of sleep.  Regular exercise can help. Ask your doctor what kinds of exercise are best for you. General instructions  Take over-the-counter and prescription medicines only as told by your doctor.  Keep all prenatal and follow-up visits as told by your doctor. This is important. Contact a doctor if:  You have symptoms that your doctor told you to watch for, such as: ? Headaches. ? Nausea. ? Vomiting. ? Belly (abdominal) pain. ? Dizziness. ? Light-headedness. Get help right away if:  You have: ? Very bad belly pain that does not get better with treatment. ? A very bad headache that does not get better. ? Vomiting that does not get better. ? Sudden, fast weight gain. ? Sudden swelling in your hands, ankles, or face. ? Bleeding from your vagina. ? Blood in  your pee. ? Blurry vision. ? Double vision. ? Shortness of breath. ? Chest pain. ? Weakness on one side of your body. ? Trouble talking.  Your baby is not moving as much as usual. Summary  High blood pressure is also called hypertension.  High blood pressure means that the force of your blood moving in your body is too strong.  High blood pressure can cause problems for you and your baby.  Keep all follow-up visits as told by your doctor. This is important. This information is not intended to replace advice given to you by your health care provider. Make sure you discuss any questions you have with your health care provider. Document Revised: 08/05/2018 Document Reviewed: 05/11/2018 Elsevier Patient Education  2020 [employer].

## 2019-05-20 NOTE — Progress Notes (Signed)
ROB GBS/Aptima  No concerns Denies lof, no vb, Good FM  

## 2019-05-20 NOTE — Progress Notes (Signed)
     Routine Prenatal Care Visit  Subjective  Heather Jacobs is a 24 y.o. G3P1011 at [redacted]w[redacted]d being seen today for ongoing prenatal care.  She is currently monitored for the following issues for this high-risk pregnancy and has Supervision of other normal pregnancy, antepartum; Palpitations; Atypical chest pain; Elevated blood pressure affecting pregnancy in third trimester, antepartum; and Pregnancy on their problem list.  ----------------------------------------------------------------------------------- Patient reports no complaints.   Contractions: Irregular. Vag. Bleeding: None.  Movement: Present. Denies leaking of fluid.  ----------------------------------------------------------------------------------- The following portions of the patient's history were reviewed and updated as appropriate: allergies, current medications, past family history, past medical history, past social history, past surgical history and problem list. Problem list updated.   Objective  Blood pressure 140/82, weight 254 lb (115.2 kg), last menstrual period 08/30/2018. Pregravid weight 222 lb (100.7 kg) Total Weight Gain 32 lb (14.5 kg) Urinalysis:      Fetal Status: Fetal Heart Rate (bpm): 140 Fundal Height: 36 cm Movement: Present  Presentation: Vertex  General:  Alert, oriented and cooperative. Patient is in no acute distress.  Skin: Skin is warm and dry. No rash noted.   Cardiovascular: Normal heart rate noted  Respiratory: Normal respiratory effort, no problems with respiration noted  Abdomen: Soft, gravid, appropriate for gestational age. Pain/Pressure: Present     Pelvic:  Cervical exam deferred Dilation: 1 Effacement (%): 20 Station: -3  Extremities: Normal range of motion.  Edema: Trace  Mental Status: Normal mood and affect. Normal behavior. Normal judgment and thought content.     Assessment   24 y.o. G3P1011 at [redacted]w[redacted]d by  06/18/2019, by Ultrasound presenting for routine prenatal visit  Plan    pregnancy Problems (from 11/12/18 to present)    Problem Noted Resolved   Supervision of other normal pregnancy, antepartum 11/12/2018 by Oswaldo Conroy, CNM No   Overview Addendum 04/11/2019  1:35 PM by Farrel Conners, CNM    Clinic [facility] Prenatal Labs  Dating [place] Blood type: B/Positive/-- (07/28 1505)   Genetic Screen  NIPS: nml XY Antibody:Negative (07/28 1505)  Anatomic [place] Normal, female fetus Rubella: <0.90 (07/28 1505) Varicella:  Immune  GTT   Third trimester: 125 RPR: Non Reactive (07/28 1505)   Rhogam na HBsAg: Negative (07/28 1505)   TDaP vaccine  04/06/2019        Flu Shot:04/06/2019 HIV: Non Reactive (07/28 1505)   Baby Food    Breast                            GBS:   Contraception  IUD OHY:[id number]  CBB  no   CS/VBAC n/a   Support Person                  Gestational age appropriate obstetric precautions including but not limited to vaginal bleeding, contractions, leaking of fluid and fetal movement were reviewed in detail with the patient.    GBS, GC,CT today Initiate antenatal monitoring for gestational Hypertension Consider delivery after [redacted] weeks gestational age.   Return in about 1 week (around 05/27/2019) for ROB/NST/AFI in person.  Natale Milch MD [facility], [facility] 05/20/2019, 2:27 PM

## 2019-05-24 LAB — CERVICOVAGINAL ANCILLARY ONLY
Chlamydia: NEGATIVE
Comment: NEGATIVE
Comment: NORMAL
Neisseria Gonorrhea: NEGATIVE

## 2019-05-24 LAB — CULTURE, BETA STREP (GROUP B ONLY): Strep Gp B Culture: NEGATIVE

## 2019-05-27 ENCOUNTER — Ambulatory Visit (INDEPENDENT_AMBULATORY_CARE_PROVIDER_SITE_OTHER): Payer: Medicaid Other

## 2019-05-27 ENCOUNTER — Other Ambulatory Visit: Payer: Self-pay

## 2019-05-27 ENCOUNTER — Ambulatory Visit (INDEPENDENT_AMBULATORY_CARE_PROVIDER_SITE_OTHER): Payer: Medicaid Other | Admitting: Obstetrics and Gynecology

## 2019-05-27 VITALS — BP 116/70 | Wt 257.0 lb

## 2019-05-27 DIAGNOSIS — O133 Gestational [pregnancy-induced] hypertension without significant proteinuria, third trimester: Secondary | ICD-10-CM

## 2019-05-27 DIAGNOSIS — Z3A36 36 weeks gestation of pregnancy: Secondary | ICD-10-CM

## 2019-05-27 DIAGNOSIS — O163 Unspecified maternal hypertension, third trimester: Secondary | ICD-10-CM

## 2019-05-27 DIAGNOSIS — Z3480 Encounter for supervision of other normal pregnancy, unspecified trimester: Secondary | ICD-10-CM

## 2019-05-27 DIAGNOSIS — O0993 Supervision of high risk pregnancy, unspecified, third trimester: Secondary | ICD-10-CM

## 2019-05-27 LAB — FETAL NONSTRESS TEST

## 2019-05-27 NOTE — Progress Notes (Signed)
Obstetric H&P   Chief Complaint: GHTN  Prenatal Care Provider: [facility]  History of Present Illness: 24 y.o. G3P1011 34w0dby 06/18/2019, by Ultrasound presenting for ROB.  [facility]  AFI today is normal, NST remains reactive.  She denies HA, vision changes, RUQ or epigastric pain.  +FM, no LOF, no VB, no ctx.  Pregravid weight 222 lb (100.7 kg) Total Weight Gain 35 lb (15.9 kg)  pregnancy Problems (from 11/12/18 to present)    Problem Noted Resolved   Supervision of other normal pregnancy, antepartum 11/12/2018 by SRexene Agent CNM No   Overview Addendum 04/11/2019  1:35 PM by GDalia Heading CHaskellPrenatal Labs  Dating UKoreaBlood type: B/Positive/-- (07/28 1505)   Genetic Screen  NIPS: nml XY Antibody:Negative (07/28 1505)  Anatomic UK[place]Normal, female fetus Rubella: <0.90 (07/28 1505) Varicella:  Immune  GTT   Third trimester: 125 RPR: Non Reactive (07/28 1505)   Rhogam na HBsAg: Negative (07/28 1505)   TDaP vaccine  04/06/2019        Flu Shot:04/06/2019 HIV: Non Reactive (07/28 1505)   Baby Food    Breast                            GBS:   Contraception  IUD PMVE:HMCNOBSJGG CBB  no   CS/VBAC n/a   Support Person                  Review of Systems: 10 point review of systems negative unless otherwise noted in HPI  Past Medical History: Past Medical History:  Diagnosis Date  . Ectopic pregnancy   . Ruptured right tubal ectopic pregnancy causing hemoperitoneum 07/18/2018    Past Surgical History: Past Surgical History:  Procedure Laterality Date  . ARTHROSCOPIC REPAIR ACL    . LAPAROSCOPY N/A 07/18/2018   Procedure: LAPAROSCOPY DIAGNOSTIC,right salpingostomy;  Surgeon: HGae Dry MD;  Location: [facility] ORS;  Service: Gynecology;  Laterality: N/A;  ectopic supected to be in blood loss    Past Obstetric History: # 1 - Date: 05/17/17, Sex: Female, Weight: 8 lb 12 oz (3.969 kg), GA: 452w0dDelivery: Vaginal,  Spontaneous, Apgar1: None, Apgar5: None, Living: Living, Birth Comments: Punctured Spine with epidural   # 2 - Date: 06/2018, Sex: None, Weight: None, GA: None, Delivery: None, Apgar1: None, Apgar5: None, Living: None, Birth Comments: None  # 3 - Date: None, Sex: None, Weight: None, GA: None, Delivery: None, Apgar1: None, Apgar5: None, Living: None, Birth Comments: None   Past Gynecologic History:  Family History: Family History  Problem Relation Age of Onset  . Diabetes Mellitus I Father   . Heart defect Father        Aortic stenosis (born with it)  . Hypothyroidism Mother     Social History: Social History   Socioeconomic History  . Marital status: Married    Spouse name: JeKatheran Awe. Number of children: 1  . Years of education: Not on file  . Highest education level: Not on file  Occupational History  . Not on file  Tobacco Use  . Smoking status: Never Smoker  . Smokeless tobacco: Never Used  Substance and Sexual Activity  . Alcohol use: Never  . Drug use: Never  . Sexual activity: Yes    Birth control/protection: I.U.D.  Other Topics Concern  . Not on file  Social History Narrative  .  Not on file   Social Determinants of Health   Financial Resource Strain:   . Difficulty of Paying Living Expenses: Not on file  Food Insecurity:   . Worried About Charity fundraiser in the Last Year: Not on file  . Ran Out of Food in the Last Year: Not on file  Transportation Needs:   . Lack of Transportation (Medical): Not on file  . Lack of Transportation (Non-Medical): Not on file  Physical Activity:   . Days of Exercise per Week: Not on file  . Minutes of Exercise per Session: Not on file  Stress:   . Feeling of Stress : Not on file  Social Connections:   . Frequency of Communication with Friends and Family: Not on file  . Frequency of Social Gatherings with Friends and Family: Not on file  . Attends Religious Services: Not on file  . Active Member of Clubs or  Organizations: Not on file  . Attends Archivist Meetings: Not on file  . Marital Status: Not on file  Intimate Partner Violence:   . Fear of Current or Ex-Partner: Not on file  . Emotionally Abused: Not on file  . Physically Abused: Not on file  . Sexually Abused: Not on file    Medications: Prior to Admission medications   Medication Sig Start Date End Date Taking? Authorizing Provider  acetaminophen (TYLENOL) 325 MG tablet Take 650 mg by mouth every 6 (six) hours as needed.   Yes [provider]  aspirin 81 MG EC tablet Take 81 mg by mouth daily. Swallow whole.   Yes [provider]  prenatal vitamin w/FE, FA (PRENATAL 1 + 1) 27-1 MG TABS tablet Take 1 tablet by mouth daily at 12 noon.   Yes [provider]  prochlorperazine (COMPAZINE) 10 MG tablet Take 1 tablet (10 mg total) by mouth every 8 (eight) hours as needed for nausea or vomiting (refractory headache). 05/03/19  Yes Will Bonnet, MD    Allergies: Allergies  Allergen Reactions  . Chlorhexidine Hives and Other (See Comments)    States she's allergic    Physical Exam: Vitals: Blood pressure 116/70, weight 257 lb (116.6 kg), last menstrual period 08/30/2018.  FHT: 130, moderate, +accles, no decels  General: NAD HEENT: normocephalic, anicteric Pulmonary: No increased work of breathing Cardiovascular: RRR, distal pulses 2+ Abdomen: Gravid, non-tender Leopolds: vtx Extremities: no edema, erythema, or tenderness Neurologic: Grossly intact Psychiatric: mood appropriate, affect full  Labs: No results found for this or any previous visit (from the past 24 hour(s)).  Assessment: 24 y.o. G3P1011 40w0dby 06/18/2019, by Ultrasound with GHTN  Plan: 1) GHTN - patient normotensive today, multiple elevated BP in late 2nd and third trimester documented in setting of no history of CHTN or elevated 1st trimester pressures.  ACOG "Hypertension in Pregnancy" Task Force Report November 14th  2013   2) Fetus - reactive NST  3) PNL - Blood type --/--/B POS (01/05 1408) / Anti-bodyscreen NEG (01/05 1408) / Rubella <0.90 (07/28 1505) / Varicella Immune / RPR Non Reactive (12/09 1612) / HBsAg Negative (07/28 1505) / HIV Non Reactive (12/09 1612) / 1-hr OGTT 125 / GBS Negative/-- (01/22 1530) - MMR postpartum  4) Immunization History -  Immunization History  Administered Date(s) Administered  . Influenza,inj,Quad PF,6+ Mos 04/06/2019  . Tdap 04/06/2019    5) Disposition -   AMalachy Mood MD, [facility] 05/28/2019, 8:37 AM    AMalachy Mood  MD, Loura Pardon OB/GYN, [facility] 05/27/2019, 2:44 PM

## 2019-05-27 NOTE — Progress Notes (Signed)
ROB NST/AFI 

## 2019-05-28 NOTE — Progress Notes (Signed)
  [facility]TH[facility]ACE INDUCTION ASSESSMENT Heather Jacobs 22-Sep-1995 Medical record #: [id number] Phone #:  Home Phone [phone]  Mobile [phone]    [facility]atal [facility] Group[facility] Proposed admission date/time:05/29/19 0500 Method of induction:Cytotec  Weight: Filed Weights01/29/21 1357Weight:257 lb (116.6 kg) BMI Body mass index is 39.08 kg/m. HIV Negative HSV Negative EDC Estimated Date of Delivery: 2/20/21based on:US  Gestational age on admission: [redacted]w[redacted]d Gravidity/parity:G3P1011  Cervix Score   0 1 2 3   Position Posterior Midposition Anterior   Consistency Firm Medium Soft   Effacement (%) 0-30 40-50 60-70 >80  Dilation (cm) Closed 1-2 3-4 >5  Baby's station -3 -2 -1 +1, +2   Bishop Score:5   Medical induction of labor  select indication(s) below Elective induction ?39 weeks multiparous patient ?39 weeks primiparous patient with Bishop score ?7 ?40 weeks primiparous patient   Medical Indications Adapted from ACOG Committee Opinion #[id number], "Medically Indicated Late Preterm and Early Term Deliveries," 2013.  PLACENTAL / UTERINE ISSUES FETAL ISSUES MATERNAL ISSUES  ? Placenta previa (36.0-37.6) ? Isoimmunization (37.0-38.6) ? Preeclampsia without severe features or gestational HTN (37.0)  ? Suspected accreta (34.0-35.6) ? Growth Restriction [birth year]1984) ? Preeclampsia with severe features (34.0)  ? Prior classical CD, uterine window, rupture (36.0-37.6) ? Isolated (38.0-39.6) ? Chronic HTN (38.0-39.6)  ? Prior myomectomy (37.0-38.6) ? Concurrent findings (34.0-37.6) ? Cholestasis (37.0)  ? Umbilical vein varix (37.0) ? Growth Restriction (Twins) ? Diabetes  ? Placental abruption (chronic) ? Di-Di Isolated (36.0-37.6) ? Pregestational, controlled (39.0)  OBSTETRIC ISSUES ? Di-Di concurrent findings (32.0-34.6) ? Pregestational, uncontrolled (37.0-39.0)  ? Postdates ? (41 weeks) ? Mo-Di isolated (32.0-34.6) ? Pregestational,  vascular compromise (37.0- 39.0)  ? PPROM (34.0) ? Multiple Gestation ? Gestational, diet controlled (40.0)  ? Hx of IUFD (39.0 weeks) ? Di-Di (38.0-38.6) ? Gestational, med controlled (39.0)  ? Polyhydramnios, mild/moderate; SDV 8-16 or AFI 25-35 (39.0) ? Mo-Di (36.0-37.6) ? Gestational, uncontrolled (38.0-39.0)  ? Oligohydramnios (36.0-37.6); MVP <2 cm  For indications not listed above, delivery recommendations from maternal-fetal medicine consultant occurred on: Date:  Provider Signature: Vadis Slabach Scheduled by: Date:05/28/2019 8:43 AM   Call [phone] to finalize the induction date/time  [phone] (07/17)

## 2019-05-29 ENCOUNTER — Other Ambulatory Visit: Payer: Self-pay

## 2019-05-29 ENCOUNTER — Inpatient Hospital Stay: Payer: Medicaid Other | Admitting: Anesthesiology

## 2019-05-29 ENCOUNTER — Encounter: Payer: Self-pay | Admitting: Obstetrics and Gynecology

## 2019-05-29 ENCOUNTER — Inpatient Hospital Stay
Admission: EM | Admit: 2019-05-29 | Discharge: 2019-05-31 | DRG: 806 | Disposition: A | Discharge status: Home / Self Care | Payer: Medicaid Other | Attending: Obstetrics & Gynecology | Admitting: Obstetrics & Gynecology

## 2019-05-29 DIAGNOSIS — O134 Gestational [pregnancy-induced] hypertension without significant proteinuria, complicating childbirth: Principal | ICD-10-CM | POA: Diagnosis present

## 2019-05-29 DIAGNOSIS — Z3480 Encounter for supervision of other normal pregnancy, unspecified trimester: Secondary | ICD-10-CM

## 2019-05-29 DIAGNOSIS — O163 Unspecified maternal hypertension, third trimester: Secondary | ICD-10-CM | POA: Diagnosis present

## 2019-05-29 DIAGNOSIS — Z3A37 37 weeks gestation of pregnancy: Secondary | ICD-10-CM | POA: Diagnosis not present

## 2019-05-29 DIAGNOSIS — D62 Acute posthemorrhagic anemia: Secondary | ICD-10-CM | POA: Diagnosis not present

## 2019-05-29 DIAGNOSIS — O9081 Anemia of the puerperium: Secondary | ICD-10-CM | POA: Diagnosis not present

## 2019-05-29 DIAGNOSIS — Z20822 Contact with and (suspected) exposure to covid-19: Secondary | ICD-10-CM | POA: Diagnosis present

## 2019-05-29 DIAGNOSIS — O133 Gestational [pregnancy-induced] hypertension without significant proteinuria, third trimester: Secondary | ICD-10-CM | POA: Diagnosis present

## 2019-05-29 DIAGNOSIS — O139 Gestational [pregnancy-induced] hypertension without significant proteinuria, unspecified trimester: Secondary | ICD-10-CM | POA: Diagnosis present

## 2019-05-29 LAB — RESPIRATORY PANEL BY RT PCR (FLU A&B, COVID)
Influenza A by PCR: NEGATIVE
Influenza B by PCR: NEGATIVE
SARS Coronavirus 2 by RT PCR: NEGATIVE

## 2019-05-29 LAB — COMPREHENSIVE METABOLIC PANEL
ALT: 14 U/L (ref 0–44)
AST: 20 U/L (ref 15–41)
Albumin: 2.6 g/dL — ABNORMAL LOW (ref 3.5–5.0)
Alkaline Phosphatase: 91 U/L (ref 38–126)
Anion gap: 8 (ref 5–15)
BUN: 8 mg/dL (ref 6–20)
CO2: 17 mmol/L — ABNORMAL LOW (ref 22–32)
Calcium: 8.4 mg/dL — ABNORMAL LOW (ref 8.9–10.3)
Chloride: 110 mmol/L (ref 98–111)
Creatinine, Ser: 0.65 mg/dL (ref 0.44–1.00)
GFR calc Af Amer: >60 mL/min (ref >60)
GFR calc non Af Amer: >60 mL/min (ref >60)
Glucose, Bld: 84 mg/dL (ref 70–99)
Potassium: 3.5 mmol/L (ref 3.5–5.1)
Sodium: 135 mmol/L (ref 135–145)
Total Bilirubin: 0.6 mg/dL (ref 0.3–1.2)
Total Protein: 6.2 g/dL — ABNORMAL LOW (ref 6.5–8.1)

## 2019-05-29 LAB — PROTEIN / CREATININE RATIO, URINE
Creatinine, Urine: 213 mg/dL
Protein Creatinine Ratio: 0.08 mg/mg{Cre} (ref 0.00–0.15)
Total Protein, Urine: 16 mg/dL

## 2019-05-29 LAB — RPR
RPR Ser Ql: REACTIVE — AB
RPR Titer: 1:1 {titer}

## 2019-05-29 LAB — TYPE AND SCREEN
ABO/RH(D): B POS
Antibody Screen: NEGATIVE

## 2019-05-29 LAB — CBC
HCT: 34.8 % — ABNORMAL LOW (ref 36.0–46.0)
Hemoglobin: 11.6 g/dL — ABNORMAL LOW (ref 12.0–15.0)
MCH: 27.6 pg (ref 26.0–34.0)
MCHC: 33.3 g/dL (ref 30.0–36.0)
MCV: 82.7 fL (ref 80.0–100.0)
Platelets: 165 10*3/uL (ref 150–400)
RBC: 4.21 MIL/uL (ref 3.87–5.11)
RDW: 12.9 % (ref 11.5–15.5)
WBC: 8.4 10*3/uL (ref 4.0–10.5)
nRBC: 0 % (ref 0.0–0.2)

## 2019-05-29 MED ORDER — OXYTOCIN BOLUS FROM INFUSION
500.0000 mL | Freq: Once | INTRAVENOUS | Status: AC
  Administered 2019-05-29: 500 mL via INTRAVENOUS

## 2019-05-29 MED ORDER — MISOPROSTOL 25 MCG QUARTER TABLET
ORAL_TABLET | ORAL | Status: AC
  Administered 2019-05-29: 25 ug via VAGINAL
  Filled 2019-05-29: qty 1

## 2019-05-29 MED ORDER — PHENYLEPHRINE 40 MCG/ML (10ML) SYRINGE FOR IV PUSH (FOR BLOOD PRESSURE SUPPORT)
80.0000 ug | PREFILLED_SYRINGE | INTRAVENOUS | Status: DC | PRN
  Filled 2019-05-29: qty 10

## 2019-05-29 MED ORDER — TERBUTALINE SULFATE 1 MG/ML IJ SOLN: 0.2500 mg | Freq: Once | INTRAMUSCULAR | Status: DC | PRN

## 2019-05-29 MED ORDER — MISOPROSTOL 100 MCG PO TABS
25.0000 ug | ORAL_TABLET | ORAL | Status: DC
  Administered 2019-05-29: 10:00:00 25 ug via BUCCAL
  Filled 2019-05-29 (×4): qty 1

## 2019-05-29 MED ORDER — OXYTOCIN 40 UNITS IN NORMAL SALINE INFUSION - SIMPLE MED
INTRAVENOUS | Status: AC
  Administered 2019-05-29: 2 m[IU]/min via INTRAVENOUS
  Filled 2019-05-29: qty 1000

## 2019-05-29 MED ORDER — LACTATED RINGERS IV SOLN: 500.0000 mL | INTRAVENOUS | Status: DC | PRN

## 2019-05-29 MED ORDER — LIDOCAINE HCL (PF) 1 % IJ SOLN: 30.0000 mL | INTRAMUSCULAR | Status: DC | PRN

## 2019-05-29 MED ORDER — MISOPROSTOL 100 MCG PO TABS
25.0000 ug | ORAL_TABLET | ORAL | Status: DC | PRN
  Filled 2019-05-29 (×3): qty 1

## 2019-05-29 MED ORDER — SOD CITRATE-CITRIC ACID 500-334 MG/5ML PO SOLN
30.0000 mL | ORAL | Status: DC | PRN
  Administered 2019-05-29 (×2): 30 mL via ORAL
  Filled 2019-05-29 (×3): qty 30

## 2019-05-29 MED ORDER — DIPHENHYDRAMINE HCL 50 MG/ML IJ SOLN
12.5000 mg | INTRAMUSCULAR | Status: AC | PRN
  Administered 2019-05-29 – 2019-05-30 (×3): 12.5 mg via INTRAVENOUS
  Filled 2019-05-29 (×2): qty 1

## 2019-05-29 MED ORDER — ACETAMINOPHEN 325 MG PO TABS: 650.0000 mg | ORAL_TABLET | ORAL | Status: DC | PRN

## 2019-05-29 MED ORDER — FENTANYL 2.5 MCG/ML W/ROPIVACAINE 0.15% IN NS 100 ML EPIDURAL (ARMC)
EPIDURAL | Status: AC
  Filled 2019-05-29: qty 100

## 2019-05-29 MED ORDER — OXYTOCIN 40 UNITS IN NORMAL SALINE INFUSION - SIMPLE MED: 2.5000 [IU]/h | INTRAVENOUS | Status: DC

## 2019-05-29 MED ORDER — OXYTOCIN 40 UNITS IN NORMAL SALINE INFUSION - SIMPLE MED: 1.0000 m[IU]/min | INTRAVENOUS | Status: DC

## 2019-05-29 MED ORDER — AMMONIA AROMATIC IN INHA
RESPIRATORY_TRACT | Status: AC
  Filled 2019-05-29: qty 10

## 2019-05-29 MED ORDER — LACTATED RINGERS IV SOLN: INTRAVENOUS | Status: DC

## 2019-05-29 MED ORDER — OXYTOCIN 10 UNIT/ML IJ SOLN
INTRAMUSCULAR | Status: AC
  Filled 2019-05-29: qty 2

## 2019-05-29 MED ORDER — LIDOCAINE HCL (PF) 1 % IJ SOLN
INTRAMUSCULAR | Status: AC
  Filled 2019-05-29: qty 30

## 2019-05-29 MED ORDER — ONDANSETRON HCL 4 MG/2ML IJ SOLN
4.0000 mg | Freq: Four times a day (QID) | INTRAMUSCULAR | Status: DC | PRN
  Administered 2019-05-29: 21:00:00 4 mg via INTRAVENOUS
  Filled 2019-05-29: qty 2

## 2019-05-29 MED ORDER — LACTATED RINGERS IV SOLN
500.0000 mL | Freq: Once | INTRAVENOUS | Status: AC
  Administered 2019-05-29: 19:00:00 500 mL via INTRAVENOUS

## 2019-05-29 MED ORDER — BUTORPHANOL TARTRATE 1 MG/ML IJ SOLN: 1.0000 mg | INTRAMUSCULAR | Status: DC | PRN

## 2019-05-29 MED ORDER — MISOPROSTOL 25 MCG QUARTER TABLET
ORAL_TABLET | ORAL | Status: AC
  Filled 2019-05-29: qty 1

## 2019-05-29 MED ORDER — EPHEDRINE 5 MG/ML INJ
10.0000 mg | INTRAVENOUS | Status: DC | PRN
  Filled 2019-05-29: qty 2

## 2019-05-29 MED ORDER — MISOPROSTOL 200 MCG PO TABS
ORAL_TABLET | ORAL | Status: AC
  Administered 2019-05-29: 10:00:00 25 ug via VAGINAL
  Filled 2019-05-29: qty 4

## 2019-05-29 MED ORDER — FENTANYL 2.5 MCG/ML W/ROPIVACAINE 0.15% IN NS 100 ML EPIDURAL (ARMC): 12.0000 mL/h | EPIDURAL | Status: DC

## 2019-05-29 MED ORDER — FENTANYL 2.5 MCG/ML W/ROPIVACAINE 0.15% IN NS 100 ML EPIDURAL (ARMC)
EPIDURAL | Status: DC | PRN
  Administered 2019-05-29: 12 mL/h via EPIDURAL

## 2019-05-29 MED ORDER — OXYTOCIN 40 UNITS IN NORMAL SALINE INFUSION - SIMPLE MED
1.0000 m[IU]/min | INTRAVENOUS | Status: DC
  Administered 2019-05-29: 3 m[IU]/min via INTRAVENOUS

## 2019-05-29 MED ORDER — BUPIVACAINE HCL (PF) 0.25 % IJ SOLN
INTRAMUSCULAR | Status: DC | PRN
  Administered 2019-05-29 (×2): 4 mL via EPIDURAL

## 2019-05-29 MED ORDER — LIDOCAINE-EPINEPHRINE (PF) 1.5 %-1:200000 IJ SOLN
INTRAMUSCULAR | Status: DC | PRN
  Administered 2019-05-29: 3 mL via PERINEURAL

## 2019-05-29 NOTE — Discharge Instructions (Signed)

## 2019-05-29 NOTE — Progress Notes (Signed)
  Labor Progress Note   24 y.o. H3J2508 @ [redacted]w[redacted]d , admitted for  Pregnancy, Labor Management. IOL gHTN  Subjective:  BP stable, no s/sx preeclampsia No pain after epidural Pitocin off at this time  Objective:  BP 132/75   Pulse (!) 53   Temp 98 F (36.7 C) (Oral)   Resp 16   Ht 5\' 8"  (1.727 m)   Wt 115.2 kg   LMP 08/30/2018   SpO2 100%   BMI 38.62 kg/m  Abd: gravid, ND, FHT present, without guarding, without rebound tenderness on exam Extr: trace to 1+ bilateral pedal edema SVE: CERVIX: 4 cm dilated, 80 effaced, -2 station, presenting part Vtx MEMBRANES: ruptured, clear fluid  EFM: FHR: 140 bpm, variability: moderate,  accelerations:  Present,  decelerations:  Absent Toco: Frequency: Every 2-4 minutes Labs: I have reviewed the patient's lab results.   Assessment & Plan:  G3P1011 @ [redacted]w[redacted]d, admitted for  Pregnancy and Labor/Delivery Management  1. Pain management: epidural. 2. FWB: FHT category 1.  3. ID: GBS negative 4. Labor management: Restart Pitocin AROM now  All discussed with patient, see orders  [redacted]w[redacted]d, MD, Annamarie Major Ob/Gyn, [facility] 05/29/2019  8:27 PM

## 2019-05-29 NOTE — Anesthesia Preprocedure Evaluation (Signed)
Anesthesia Evaluation  Patient identified by MRN, date of birth, ID band Patient awake    Reviewed: Allergy & Precautions, NPO status , Patient's Chart, lab work & pertinent test results  Airway Mallampati: III       Dental  (+) Teeth Intact   Pulmonary neg pulmonary ROS,    Pulmonary exam normal        Cardiovascular hypertension, Normal cardiovascular exam     Neuro/Psych negative neurological ROS  negative psych ROS   GI/Hepatic negative GI ROS, Neg liver ROS,   Endo/Other  negative endocrine ROS  Renal/GU negative Renal ROS  Female GU complaint     Musculoskeletal negative musculoskeletal ROS (+)   Abdominal Normal abdominal exam  (+)   Peds negative pediatric ROS (+)  Hematology negative hematology ROS (+)   Anesthesia Other Findings Past Medical History: No date: Ectopic pregnancy 07/18/2018: Ruptured right tubal ectopic pregnancy causing  hemoperitoneum  Reproductive/Obstetrics (+) Pregnancy                             Anesthesia Physical Anesthesia Plan  ASA: II  Anesthesia Plan: Epidural   Post-op Pain Management:    Induction:   PONV Risk Score and Plan:   Airway Management Planned: Natural Airway  Additional Equipment:   Intra-op Plan:   Post-operative Plan:   Informed Consent: I have reviewed the patients History and Physical, chart, labs and discussed the procedure including the risks, benefits and alternatives for the proposed anesthesia with the patient or authorized representative who has indicated his/her understanding and acceptance.     Dental advisory given  Plan Discussed with: CRNA and Surgeon  Anesthesia Plan Comments:         Anesthesia Quick Evaluation

## 2019-05-29 NOTE — Progress Notes (Signed)
  Labor Progress Note   24 y.o. G5Q9826 @ [redacted]w[redacted]d , admitted for  Pregnancy, Labor Management. IOL Gest HTN  Subjective:  Mild pain Cytotec x2  Objective:  BP 125/73   Pulse 60   Temp 98 F (36.7 C) (Oral)   Resp 16   Ht 5\' 8"  (1.727 m)   Wt 115.2 kg   LMP 08/30/2018   BMI 38.62 kg/m  Abd: gravid, ND, FHT present, mild tenderness on exam Extr: trace to 1+ bilateral pedal edema SVE: CERVIX: 2 cm dilated, 70% effaced, -3 station, presenting part VTX MEMBRANES: intact  EFM: FHR: 140 bpm, variability: moderate,  accelerations:  Present,  decelerations:  Absent Toco: Frequency: Every few minutes Labs: I have reviewed the patient's lab results.   Assessment & Plan:  G3P1011 @ [redacted]w[redacted]d, admitted for  Pregnancy and Labor/Delivery Management  1. Pain management: none. 2. FWB: FHT category 1.  3. ID: GBS negative 4. Labor management: Pitocin at this time AROM when appropriate Epidural when desired  All discussed with patient, see orders  [redacted]w[redacted]d, MD, Annamarie Major Ob/Gyn, [facility] 05/29/2019  3:07 PM

## 2019-05-29 NOTE — Discharge Summary (Signed)
OB Discharge Summary     Patient Name: Heather Jacobs DOB: 1995/12/22 MRN: [id number]  Date of admission: 05/29/2019 Delivering MD: Hoyt Koch, MD  Date of Delivery: 05/29/2019  Date of discharge: 05/31/2019  Admitting diagnosis: Gestational hypertension [O13.9] Intrauterine pregnancy: [redacted]w[redacted]d     Secondary diagnosis: Gestational Hypertension     Discharge diagnosis: Term Pregnancy Delivered, Gestational hypertension                         Hospital course:  Induction of Labor With Vaginal Delivery   24 y.o. yo G3P1011 at [redacted]w[redacted]d was admitted to the hospital 05/29/2019 for induction of labor.  Indication for induction: Gestational hypertension.  Patient had an uncomplicated labor course as follows: Membrane Rupture Time/Date: 8:24 PM ,05/29/2019   Intrapartum Procedures: Episiotomy: None [1]                                         Lacerations:  2nd degree [3]  Patient had delivery of a Viable infant.  Information for the patient's newborn:  Brittannie, Tawney [[id number]]  Delivery Method: Vag-Spont    05/29/2019  Details of delivery can be found in separate delivery note.  Patient had a routine postpartum course. Patient is discharged home 05/31/19.                                                                 Post partum procedures:none  Complications: None  Physical exam on 05/31/2019: Vitals:   05/30/19 1606 05/30/19 2302 05/31/19 0415 05/31/19 0744  BP: 118/77 130/74 133/66 134/82  Pulse: 64 (!) 55 70 62  Resp: 20 18 18 20   Temp: 97.8 F (36.6 C) 98.5 F (36.9 C) 97.9 F (36.6 C) 98 F (36.7 C)  TempSrc: Oral Oral Oral Oral  SpO2:  100% 100% 100%  Weight:      Height:       General: alert, cooperative and no distress Lochia: appropriate Uterine Fundus: firm DVT Evaluation: No evidence of DVT seen on physical exam.  Labs: Lab Results  Component Value Date   WBC 12.3 (H) 05/30/2019   HGB 10.7 (L) 05/30/2019   HCT 33.0 (L) 05/30/2019   MCV 84.8 05/30/2019    PLT 136 (L) 05/30/2019   CMP Latest Ref Rng & Units 05/29/2019  Glucose 70 - 99 mg/dL 84  BUN 6 - 20 mg/dL 8  Creatinine 0.44 - 1.00 mg/dL 0.65  Sodium 135 - 145 mmol/L 135  Potassium 3.5 - 5.1 mmol/L 3.5  Chloride 98 - 111 mmol/L 110  CO2 22 - 32 mmol/L 17(L)  Calcium 8.9 - 10.3 mg/dL 8.4(L)  Total Protein 6.5 - 8.1 g/dL 6.2(L)  Total Bilirubin 0.3 - 1.2 mg/dL 0.6  Alkaline Phos 38 - 126 U/L 91  AST 15 - 41 U/L 20  ALT 0 - 44 U/L 14    Discharge instruction: per After Visit Summary.  Medications:  Allergies as of 05/31/2019      Reactions   Chlorhexidine Hives, Other (See Comments)   States she's allergic      Medication List    TAKE these medications   acetaminophen 325  MG tablet Commonly known as: TYLENOL Take 650 mg by mouth every 6 (six) hours as needed.   ibuprofen 600 MG tablet Commonly known as: ADVIL Take 1 tablet (600 mg total) by mouth every 6 (six) hours.   prenatal vitamin w/FE, FA 27-1 MG Tabs tablet Take 1 tablet by mouth daily at 12 noon.       Diet: routine diet  Activity: Advance as tolerated. Pelvic rest for 6 weeks.   Outpatient follow up: Follow-up Information    Nadara Mustard, MD. Schedule an appointment as soon as possible for a visit in 1 week(s).   Specialty: Obstetrics and Gynecology Why: BP check, 6 week postpartum Contact information: [address] [place] [address] [phone]             Postpartum contraception: IUD Mirena Rhogam Given postpartum: no Rubella vaccine given postpartum: yes Varicella vaccine given postpartum: no TDaP given antepartum or postpartum: Yes  Newborn Data: Live born female  Birth Weight:   APGAR: ,   Newborn Delivery   Birth date/time: 05/29/2019 23:34:00 Delivery type: Vaginal, Spontaneous       Postpartum Depression risk not applicable to this patient.      Baby Feeding: Breast  Disposition:home with mother  SIGNED: Vena Austria, MD 05/31/2019 7:53 AM

## 2019-05-29 NOTE — Anesthesia Procedure Notes (Signed)
Epidural Patient location during procedure: OB Start time: 05/29/2019 6:45 PM End time: 05/29/2019 6:50 PM  Staffing Anesthesiologist: Yves Dill, MD Resident/CRNA: Almeta Monas, CRNA Performed: resident/CRNA   Preanesthetic Checklist Completed: patient identified, IV checked, site marked, risks and benefits discussed, surgical consent, monitors and equipment checked, pre-op evaluation and timeout performed  Epidural Patient position: sitting Prep: Betadine Patient monitoring: heart rate, continuous pulse ox and blood pressure Approach: midline Location: L3-L4 Injection technique: LOR saline  Needle:  Needle type: Tuohy  Needle gauge: 17 G Needle length: 9 cm and 9 Needle insertion depth: 7 cm Catheter type: closed end flexible Catheter size: 19 Gauge Catheter at skin depth: 12 cm Test dose: negative and 1.5% lidocaine with Epi 1:200 K  Assessment Sensory level: T8 Events: blood not aspirated, injection not painful, no injection resistance, no paresthesia and negative IV test  Additional Notes   Patient tolerated the insertion well without complications.Reason for block:procedure for pain

## 2019-05-29 NOTE — H&P (Signed)
History and Physical Interval Note:  05/29/2019 10:24 AM  Heather Jacobs  has presented today for INDUCTION OF LABOR (cervical ripening agents),  with the diagnosis of Gestational hypertension. The various methods of treatment have been discussed with the patient and family. After consideration of risks, benefits and other options for treatment, the patient has consented to  Labor induction .  The patient's history has been reviewed, patient examined, no change in status, and is stable for induction as planned.  See H&P. I have reviewed the patient's chart and labs.  Questions were answered to the patient's satisfaction.    Based on exam, starting with Cytotec.    Second dose now  Patient has been fully informed of the pros and cons, risks and benefits of continued observation with fetal monitoring versus that of induction of labor.   She understands that there are uncommon risks to induction, which include but are not limited to : frequent or prolonged uterine contractions, fetal distress, uterine rupture, and lack of successful induction.  These risks include all methods including Pitocin and Misoprostol and Cervadil.  Patient understands that using Misoprostol for labor induction is an "off label" indication although it has been studied extensively for this purpose and is an accepted method of induction.  She also has been informed of the increased risks for Cesarean with induction and should induction not be successful.  Patient consents to the induction plan of management.   Annamarie Major, MD, Merlinda Frederick Ob/Gyn, [facility] 05/29/2019  10:24 AM

## 2019-05-30 ENCOUNTER — Encounter: Payer: Self-pay | Admitting: Obstetrics and Gynecology

## 2019-05-30 LAB — CBC
HCT: 33 % — ABNORMAL LOW (ref 36.0–46.0)
Hemoglobin: 10.7 g/dL — ABNORMAL LOW (ref 12.0–15.0)
MCH: 27.5 pg (ref 26.0–34.0)
MCHC: 32.4 g/dL (ref 30.0–36.0)
MCV: 84.8 fL (ref 80.0–100.0)
Platelets: 136 10*3/uL — ABNORMAL LOW (ref 150–400)
RBC: 3.89 MIL/uL (ref 3.87–5.11)
RDW: 12.9 % (ref 11.5–15.5)
WBC: 12.3 10*3/uL — ABNORMAL HIGH (ref 4.0–10.5)
nRBC: 0 % (ref 0.0–0.2)

## 2019-05-30 LAB — T.PALLIDUM AB, TOTAL: T Pallidum Abs: NONREACTIVE

## 2019-05-30 MED ORDER — WITCH HAZEL-GLYCERIN EX PADS: 1.0000 "application " | MEDICATED_PAD | CUTANEOUS | Status: DC | PRN

## 2019-05-30 MED ORDER — BENZOCAINE-MENTHOL 20-0.5 % EX AERO
1.0000 "application " | INHALATION_SPRAY | CUTANEOUS | Status: DC | PRN
  Administered 2019-05-30: 1 via TOPICAL
  Filled 2019-05-30: qty 56

## 2019-05-30 MED ORDER — SODIUM CHLORIDE 0.9% FLUSH: 3.0000 mL | INTRAVENOUS | Status: DC | PRN

## 2019-05-30 MED ORDER — SENNOSIDES-DOCUSATE SODIUM 8.6-50 MG PO TABS
2.0000 | ORAL_TABLET | ORAL | Status: DC
  Administered 2019-05-30: 2 via ORAL
  Filled 2019-05-30: qty 2

## 2019-05-30 MED ORDER — SIMETHICONE 80 MG PO CHEW: 80.0000 mg | CHEWABLE_TABLET | ORAL | Status: DC | PRN

## 2019-05-30 MED ORDER — SODIUM CHLORIDE 0.9% FLUSH
3.0000 mL | Freq: Two times a day (BID) | INTRAVENOUS | Status: DC
  Administered 2019-05-31: 02:00:00 3 mL via INTRAVENOUS

## 2019-05-30 MED ORDER — OXYCODONE-ACETAMINOPHEN 5-325 MG PO TABS: 2.0000 | ORAL_TABLET | ORAL | Status: DC | PRN

## 2019-05-30 MED ORDER — ONDANSETRON HCL 4 MG/2ML IJ SOLN: 4.0000 mg | INTRAMUSCULAR | Status: DC | PRN

## 2019-05-30 MED ORDER — OXYCODONE-ACETAMINOPHEN 5-325 MG PO TABS: 1.0000 | ORAL_TABLET | ORAL | Status: DC | PRN

## 2019-05-30 MED ORDER — DIBUCAINE (PERIANAL) 1 % EX OINT: 1.0000 "application " | TOPICAL_OINTMENT | CUTANEOUS | Status: DC | PRN

## 2019-05-30 MED ORDER — DIPHENHYDRAMINE HCL 25 MG PO CAPS: 25.0000 mg | ORAL_CAPSULE | Freq: Four times a day (QID) | ORAL | Status: DC | PRN

## 2019-05-30 MED ORDER — ACETAMINOPHEN 325 MG PO TABS: 650.0000 mg | ORAL_TABLET | ORAL | Status: DC | PRN

## 2019-05-30 MED ORDER — MEASLES, MUMPS & RUBELLA VAC IJ SOLR
0.5000 mL | Freq: Once | INTRAMUSCULAR | Status: AC
  Administered 2019-05-31: 11:00:00 0.5 mL via SUBCUTANEOUS
  Filled 2019-05-30 (×2): qty 0.5

## 2019-05-30 MED ORDER — ZOLPIDEM TARTRATE 5 MG PO TABS: 5.0000 mg | ORAL_TABLET | Freq: Every evening | ORAL | Status: DC | PRN

## 2019-05-30 MED ORDER — ONDANSETRON HCL 4 MG PO TABS: 4.0000 mg | ORAL_TABLET | ORAL | Status: DC | PRN

## 2019-05-30 MED ORDER — IBUPROFEN 600 MG PO TABS
600.0000 mg | ORAL_TABLET | Freq: Four times a day (QID) | ORAL | Status: DC
  Administered 2019-05-30 (×3): 600 mg via ORAL
  Filled 2019-05-30 (×4): qty 1

## 2019-05-30 MED ORDER — COCONUT OIL OIL: 1.0000 "application " | TOPICAL_OIL | Status: DC | PRN

## 2019-05-30 MED ORDER — SODIUM CHLORIDE 0.9 % IV SOLN: 250.0000 mL | INTRAVENOUS | Status: DC | PRN

## 2019-05-30 NOTE — Anesthesia Postprocedure Evaluation (Signed)
Anesthesia Post Note  Patient: [name], academic  Procedure(s) Performed: AN AD HOC LABOR EPIDURAL  Patient location during evaluation: Mother Baby Anesthesia Type: Epidural Level of consciousness: awake and alert Pain management: pain level controlled Vital Signs Assessment: post-procedure vital signs reviewed and stable Respiratory status: spontaneous breathing, nonlabored ventilation and respiratory function stable Cardiovascular status: stable Postop Assessment: no headache, no backache and epidural receding Anesthetic complications: no     Last Vitals:  Vitals:   05/30/19 0309 05/30/19 0407  BP: 134/73 126/67  Pulse: 70 62  Resp: 18 18  Temp: 37 C 37 C  SpO2: 100% 97%    Last Pain:  Vitals:   05/30/19 0535  TempSrc:   PainSc: 0-No pain                 Rosell Khouri B Alonza Smoker

## 2019-05-30 NOTE — Progress Notes (Signed)
Obstetric Postpartum Daily Progress Note Subjective:  24 y.o. O1B5102 postpartum day #1 status post vaginal delivery.  She is ambulating, is tolerating po, is voiding spontaneously.  Her pain is well controlled on PO pain medications. Her lochia slightly greater than menses.   Medications SCHEDULED MEDICATIONS  . ibuprofen  600 mg Oral Q6H  . measles, mumps & rubella vaccine  0.5 mL Subcutaneous Once  . senna-docusate  2 tablet Oral Q24H  . sodium chloride flush  3 mL Intravenous Q12H    MEDICATION INFUSIONS  . sodium chloride      PRN MEDICATIONS  sodium chloride flush **AND** sodium chloride flush **AND** sodium chloride, acetaminophen, benzocaine-Menthol, coconut oil, witch hazel-glycerin **AND** dibucaine, diphenhydrAMINE, ondansetron **OR** ondansetron (ZOFRAN) IV, oxyCODONE-acetaminophen, oxyCODONE-acetaminophen, simethicone, zolpidem    Objective:   Vitals:   05/30/19 0244 05/30/19 0309 05/30/19 0407 05/30/19 0749  BP: 98/71 134/73 126/67 108/73  Pulse: 75 70 62 60  Resp:  18 18 20   Temp:  98.6 F (37 C) 98.6 F (37 C) 98.4 F (36.9 C)  TempSrc:  Oral Oral Oral  SpO2:  100% 97% 98%  Weight:      Height:        Current Vital Signs 24h Vital Sign Ranges  T 98.4 F (36.9 C) Temp  Avg: 98.3 F (36.8 C)  Min: 97.8 F (36.6 C)  Max: 98.6 F (37 C)  BP 108/73 BP  Min: 98/71  Max: 156/109  HR 60 Pulse  Avg: 68.1  Min: 50  Max: 152  RR 20 Resp  Avg: 17.5  Min: 16  Max: 20  SaO2 98 % Room Air SpO2  Avg: 99.5 %  Min: 97 %  Max: 100 %       24 Hour I/O Current Shift I/O  Time Ins Outs 01/31 0701 - 02/01 0700 In: -  Out: 1470 [Urine:1180] No intake/output data recorded.  General: NAD Pulmonary: no increased work of breathing Abdomen: non-distended, non-tender, fundus firm at level of umbilicus Extremities: no edema, no erythema, no tenderness  Labs:  Recent Labs  Lab 05/29/19 0530 05/30/19 0618  WBC 8.4 12.3*  HGB 11.6* 10.7*  HCT 34.8* 33.0*  PLT 165 136*      Assessment:   24 y.o. 25 postpartum day # 1 status post SVD,   Plan:   1) Acute blood loss anemia - hemodynamically stable and asymptomatic - po ferrous sulfate  2) B POS /  Information for the patient's newborn:  Nazyia, Gaugh Vicente Serene     / Rubella <0.90 (07/28 1505)/ Varicella Immune  3) TDAP status received 03/2019, Influenza vaccine: 03/2019  4) breastfeeding /Contraception = IUD  5) Disposition: home in the AM  04/2019, MD 05/30/2019 10:43 AM

## 2019-05-30 NOTE — Lactation Note (Signed)
This note was copied from a baby's chart. Lactation Consultation Note  Patient Name: Heather Jacobs [id number]'T Date: 05/30/2019 Reason for consult: Follow-up assessment;Early term 37-38.6wks;Other (Comment)(Latching and sucking from right breast now)  Mom had been having difficulty getting [name] to latch to right breast and he was only feeding on left breast breast for 3 to 5 minutes.  Mom reports he had been breast feeding longer right after delivery, but has been more sleepy this morning.  Demonstrated how easily we could hand express her colostrum before feeding to entice him to latch and after breast feeding to prevent bacteria, lubricate and prevent sore nipples.  Mom brought her own nipple cream.  For this breast feed mom could get him latched to the right breast after only a couple of tries with minimal assistance.  When he started falling asleep, demonstrated massaging breast and gently touching chin to keep him actively sucking at the breast especially since he is only 37.[redacted] weeks gestation.  Even though [name] has not been breast feeding for long intervals, mom has been committed to put him to the breast whenever he demonstrates any feeding cues and he is probably getting a good amount in a short period because we can easily hand express, there are audible swallows at the breast and he has had 6 bowel movement and 4 voids since birth and he is not even 24 hours old yet.  Mom can feel strong tugs at the breast, but denies any pain. Mom has a 48 month old that she breast fed for 22 months without challenges except positioning and latching him in the beginning when he was born with a broken clavicle.  Reviewed normal newborn stomach size, supply and demand, normal course of lactation and routine newborn feeding patterns.  Lactation name and number is written on white board.  Lactation community resource hand out with contact numbers given and reviewed encouraging mom to call with any  questions, concerns or assistance.  Maternal Data Formula Feeding for Exclusion: No Has patient been taught Hand Expression?: Yes Does the patient have breastfeeding experience prior to this delivery?: Yes  Feeding Feeding Type: Breast Fed  LATCH Score Latch: Repeated attempts needed to sustain latch, nipple held in mouth throughout feeding, stimulation needed to elicit sucking reflex.  Audible Swallowing: A few with stimulation  Type of Nipple: Everted at rest and after stimulation(Small nipple)  Comfort (Breast/Nipple): Soft / non-tender  Hold (Positioning): No assistance needed to correctly position infant at breast.  LATCH Score: 8  Interventions Interventions: Skin to skin;Breast massage;Hand express;Reverse pressure;Breast compression;Support pillows;Position options  Lactation Tools Discussed/Used Tools: Other (comment)(Mom brought her own nipple cream) WIC Program: Yes   Consult Status Consult Status: PRN Follow-up type: Call as needed    Louis Meckel 05/30/2019, 6:38 PM

## 2019-05-31 MED ORDER — IBUPROFEN 600 MG PO TABS: 600.0000 mg | ORAL_TABLET | Freq: Four times a day (QID) | ORAL | 0 refills | Status: DC

## 2019-05-31 NOTE — Lactation Note (Signed)
This note was copied from a baby's chart. Lactation Consultation Note  Patient Name: Heather Jacobs Date: 05/31/2019 Reason for consult: Follow-up assessment  Follow-up prior to discharge. This is mom's second child, with previous 22 months experience. No problems or concerns reported today. [facility] student provided outpatient information and encouraged to seek support if needed.   Maternal Data    Feeding    LATCH Score                   Interventions Interventions: Breast feeding basics reviewed  Lactation Tools Discussed/Used     Consult Status Consult Status: Complete Date: 05/31/19 Follow-up type: Call as needed    Danford Bad 05/31/2019, 11:01 AM

## 2019-05-31 NOTE — Plan of Care (Signed)
Vs stable; up ad lib; has declined motrin this shift; breastfeeding and has asked for no assistance this shift

## 2019-05-31 NOTE — Progress Notes (Signed)
Discharge instructions given and prescription sent to pharmacy by provider. Patient verbalizes understanding of teaching. Patient discharged home via wheelchair at 1210.

## 2019-06-03 ENCOUNTER — Encounter: Payer: Medicaid Other | Admitting: Obstetrics and Gynecology

## 2019-06-09 ENCOUNTER — Encounter: Payer: Self-pay | Admitting: Obstetrics & Gynecology

## 2019-06-09 ENCOUNTER — Other Ambulatory Visit: Payer: Self-pay

## 2019-06-09 ENCOUNTER — Ambulatory Visit (INDEPENDENT_AMBULATORY_CARE_PROVIDER_SITE_OTHER): Payer: Medicaid Other | Admitting: Obstetrics & Gynecology

## 2019-06-09 ENCOUNTER — Telehealth: Payer: Self-pay | Admitting: Obstetrics & Gynecology

## 2019-06-09 VITALS — BP 120/80 | Ht 68.0 in | Wt 236.0 lb

## 2019-06-09 DIAGNOSIS — O165 Unspecified maternal hypertension, complicating the puerperium: Secondary | ICD-10-CM

## 2019-06-09 DIAGNOSIS — Z0131 Encounter for examination of blood pressure with abnormal findings: Secondary | ICD-10-CM | POA: Diagnosis not present

## 2019-06-09 DIAGNOSIS — Z0289 Encounter for other administrative examinations: Secondary | ICD-10-CM

## 2019-06-09 DIAGNOSIS — O163 Unspecified maternal hypertension, third trimester: Secondary | ICD-10-CM

## 2019-06-09 NOTE — Telephone Encounter (Signed)
Noted. Will order to arrive by apt date/time. 

## 2019-06-09 NOTE — Progress Notes (Signed)
Obstetrics & Gynecology Office Visit   Chief Complaint  Patient presents with  . Blood Pressure Check   History of Present Illness: 24 y.o. [id number] being seen for follow up blood pressure check today.  The patient is postpartum 11 days and had noted gestational hypertension that she had in the third trimester which led to her induction of labor and subsequent health vaginal delivery.  At this time, she denies headache, blurry vision, CP, SOB, and her edema has resolved.  The established diagnosis for the patient is gestational hypertension.  She is currently on no antihypertensives.  She reports no current symptoms attributable to her blood pressure.  Medication list reviewed medications which may contribute to BP elevation were not noted.  Past Medical History:  Past Medical History:  Diagnosis Date  . Ectopic pregnancy   . Ruptured right tubal ectopic pregnancy causing hemoperitoneum 07/18/2018    Past Surgical History:  Past Surgical History:  Procedure Laterality Date  . ARTHROSCOPIC REPAIR ACL    . DILATION AND EVACUATION  06/27/2018  . LAPAROSCOPY N/A 07/18/2018   Procedure: LAPAROSCOPY DIAGNOSTIC,right salpingostomy;  Surgeon: Gae Dry, MD;  Location: [facility] ORS;  Service: Gynecology;  Laterality: N/A;  ectopic supected to be in blood loss    Gynecologic History: No LMP recorded.  Obstetric History: [id number]  Family History:  Family History  Problem Relation Age of Onset  . Diabetes Mellitus I Father   . Heart defect Father        Aortic stenosis (born with it)  . Hypothyroidism Mother     Social History:  Social History   Socioeconomic History  . Marital status: Married    Spouse name: Katheran Awe  . Number of children: 1  . Years of education: Not on file  . Highest education level: Not on file  Occupational History  . Not on file  Tobacco Use  . Smoking status: Never Smoker  . Smokeless tobacco: Never Used  Substance and Sexual Activity  . Alcohol  use: Never  . Drug use: Never  . Sexual activity: Yes    Birth control/protection: I.U.D.  Other Topics Concern  . Not on file  Social History Narrative  . Not on file   Social Determinants of Health   Financial Resource Strain:   . Difficulty of Paying Living Expenses: Not on file  Food Insecurity:   . Worried About Charity fundraiser in the Last Year: Not on file  . Ran Out of Food in the Last Year: Not on file  Transportation Needs:   . Lack of Transportation (Medical): Not on file  . Lack of Transportation (Non-Medical): Not on file  Physical Activity:   . Days of Exercise per Week: Not on file  . Minutes of Exercise per Session: Not on file  Stress:   . Feeling of Stress : Not on file  Social Connections:   . Frequency of Communication with Friends and Family: Not on file  . Frequency of Social Gatherings with Friends and Family: Not on file  . Attends Religious Services: Not on file  . Active Member of Clubs or Organizations: Not on file  . Attends Archivist Meetings: Not on file  . Marital Status: Not on file  Intimate Partner Violence:   . Fear of Current or Ex-Partner: Not on file  . Emotionally Abused: Not on file  . Physically Abused: Not on file  . Sexually Abused: Not on file    Allergies:  Allergies  Allergen Reactions  . Chlorhexidine Hives and Other (See Comments)    States she's allergic    Medications: Prior to Admission medications   Medication Sig Start Date End Date Taking? Authorizing Provider  prenatal vitamin w/FE, FA (PRENATAL 1 + 1) 27-1 MG TABS tablet Take 1 tablet by mouth daily at 12 noon.   Yes [provider]  acetaminophen (TYLENOL) 325 MG tablet Take 650 mg by mouth every 6 (six) hours as needed.    [provider]  ibuprofen (ADVIL) 600 MG tablet Take 1 tablet (600 mg total) by mouth every 6 (six) hours. Patient not taking: Reported on 06/09/2019 05/31/19   Vena Austria, MD    Review of Systems    Constitutional: Negative for chills, fever and malaise/fatigue.  HENT: Negative for congestion, sinus pain and sore throat.   Eyes: Negative for blurred vision and pain.  Respiratory: Negative for cough and wheezing.   Cardiovascular: Negative for chest pain and leg swelling.  Gastrointestinal: Negative for abdominal pain, constipation, diarrhea, heartburn, nausea and vomiting.  Genitourinary: Negative for dysuria, frequency, hematuria and urgency.  Musculoskeletal: Negative for back pain, joint pain, myalgias and neck pain.  Skin: Negative for itching and rash.  Neurological: Negative for dizziness, tremors and weakness.  Endo/Heme/Allergies: Does not bruise/bleed easily.  Psychiatric/Behavioral: Negative for depression. The patient is not nervous/anxious and does not have insomnia.     Physical Exam Blood pressure 120/80, height 5\' 8"  (1.727 m), weight 236 lb (107 kg) General: NAD HEENT: normocephalic, anicteric Pulmonary: No increased work of breathing Cardiovascular: RRR, distal pulses 2+ Extremities: noedema, no erythema, no tenderness Neurologic: Grossly intact Psychiatric: mood appropriate, affect full  Assessment: 24 y.o. 24 presenting for blood pressure evaluation today  Plan: Problem List Items Addressed This Visit      Other   Elevated blood pressure affecting pregnancy in third trimester, antepartum - Primary      1) Blood pressure - blood pressure at today's visit is normotensive.  As a result antihypertensive therapy is currently not warranted. - additional blood work was not obtained   No further action needed  Plan PP check and IUD placement in 4 weeks Monitor fo PPD  A total of 10 minutes were spent face-to-face with the patient as well as preparation, review, communication, and documentation during this encounter.   [id number], MD, Annamarie Major Ob/Gyn, [facility] 06/09/2019  10:34 AM

## 2019-06-09 NOTE — Telephone Encounter (Signed)
3/11 at 820 for mirena with [facility]

## 2019-06-18 ENCOUNTER — Inpatient Hospital Stay: Admit: 2019-06-18 | Payer: Self-pay

## 2019-06-21 ENCOUNTER — Telehealth: Payer: Self-pay | Admitting: Obstetrics & Gynecology

## 2019-06-21 NOTE — Telephone Encounter (Signed)
FMLA sent this morning. I have sent pt a mychart message telling her that her FMLA was sent this morning and to let me know if there is anything else I can do for her.

## 2019-06-21 NOTE — Telephone Encounter (Signed)
Patient is calling to follow upon her FMLA. Please advise

## 2019-07-07 ENCOUNTER — Encounter: Payer: Self-pay | Admitting: Obstetrics & Gynecology

## 2019-07-07 ENCOUNTER — Other Ambulatory Visit (HOSPITAL_COMMUNITY)
Admission: RE | Admit: 2019-07-07 | Discharge: 2019-07-07 | Disposition: A | Discharge status: Home / Self Care | Payer: Medicaid Other | Source: Ambulatory Visit | Attending: Obstetrics & Gynecology | Admitting: Obstetrics & Gynecology

## 2019-07-07 ENCOUNTER — Ambulatory Visit (INDEPENDENT_AMBULATORY_CARE_PROVIDER_SITE_OTHER): Payer: Medicaid Other | Admitting: Obstetrics & Gynecology

## 2019-07-07 ENCOUNTER — Other Ambulatory Visit: Payer: Self-pay

## 2019-07-07 VITALS — BP 120/80 | Ht 68.0 in | Wt 247.0 lb

## 2019-07-07 DIAGNOSIS — Z30430 Encounter for insertion of intrauterine contraceptive device: Secondary | ICD-10-CM

## 2019-07-07 DIAGNOSIS — Z1332 Encounter for screening for maternal depression: Secondary | ICD-10-CM

## 2019-07-07 DIAGNOSIS — Z124 Encounter for screening for malignant neoplasm of cervix: Secondary | ICD-10-CM | POA: Insufficient documentation

## 2019-07-07 NOTE — Patient Instructions (Signed)
Intrauterine Device Insertion, Care After  This sheet gives you information about how to care for yourself after your procedure. Your health care provider may also give you more specific instructions. If you have problems or questions, contact your health care provider. What can I expect after the procedure? After the procedure, it is common to have:  Cramps and pain in the abdomen.  Light bleeding (spotting) or heavier bleeding that is like your menstrual period. This may last for up to a few days.  Lower back pain.  Dizziness.  Headaches.  Nausea. Follow these instructions at home:  Before resuming sexual activity, check to make sure that you can feel the IUD string(s). You should be able to feel the end of the string(s) below the opening of your cervix. If your IUD string is in place, you may resume sexual activity. ? If you had a hormonal IUD inserted more than 7 days after your most recent period started, you will need to use a backup method of birth control for 7 days after IUD insertion. Ask your health care provider whether this applies to you.  Continue to check that the IUD is still in place by feeling for the string(s) after every menstrual period, or once a month.  Take over-the-counter and prescription medicines only as told by your health care provider.  Do not drive or use heavy machinery while taking prescription pain medicine.  Keep all follow-up visits as told by your health care provider. This is important. Contact a health care provider if:  You have bleeding that is heavier or lasts longer than a normal menstrual cycle.  You have a fever.  You have cramps or abdominal pain that get worse or do not get better with medicine.  You develop abdominal pain that is new or is not in the same area of earlier cramping and pain.  You feel lightheaded or weak.  You have abnormal or bad-smelling discharge from your vagina.  You have pain during sexual  activity.  You have any of the following problems with your IUD string(s): ? The string bothers or hurts you or your sexual partner. ? You cannot feel the string. ? The string has gotten longer.  You can feel the IUD in your vagina.  You think you may be pregnant, or you miss your menstrual period.  You think you may have an STI (sexually transmitted infection). Get help right away if:  You have flu-like symptoms.  You have a fever and chills.  You can feel that your IUD has slipped out of place. Summary  After the procedure, it is common to have cramps and pain in the abdomen. It is also common to have light bleeding (spotting) or heavier bleeding that is like your menstrual period.  Continue to check that the IUD is still in place by feeling for the string(s) after every menstrual period, or once a month.  Keep all follow-up visits as told by your health care provider. This is important.  Contact your health care provider if you have problems with your IUD string(s), such as the string getting longer or bothering you or your sexual partner. This information is not intended to replace advice given to you by your health care provider. Make sure you discuss any questions you have with your health care provider. Document Revised: 03/27/2017 Document Reviewed: 03/05/2016 Elsevier Patient Education  2020 Elsevier Inc.  

## 2019-07-07 NOTE — Progress Notes (Signed)
  OBSTETRICS POSTPARTUM CLINIC PROGRESS NOTE  Subjective:     Heather Jacobs is a 24 y.o. [phone] female who presents for a postpartum visit. She is 6 weeks postpartum following a Term pregnancy and delivery by Vaginal, no problems at delivery.  I have fully reviewed the prenatal and intrapartum course. Anesthesia: epidural.  Postpartum course has been complicated by uncomplicated.  Baby is feeding by Breast.  Bleeding: patient has not  resumed menses.  Bowel function is normal. Bladder function is normal.  Patient is not sexually active. Contraception method desired is IUD.  Postpartum depression screening: positive. Mostly anxiety.  Denies SI, HI.  Is not neglectful to baby.  Edinburgh 12.  The following portions of the patient's history were reviewed and updated as appropriate: allergies, current medications, past family history, past medical history, past social history, past surgical history and problem list.  Review of Systems Pertinent items are noted in HPI.  Objective:    BP 120/80   Ht 5\' 8"  (1.727 m)   Wt 247 lb (112 kg)   BMI 37.56 kg/m   General:  alert and no distress   Breasts:  inspection negative, no nipple discharge or bleeding, no masses or nodularity palpable  Lungs: clear to auscultation bilaterally  Heart:  regular rate and rhythm, S1, S2 normal, no murmur, click, rub or gallop  Abdomen: soft, non-tender; bowel sounds normal; no masses,  no organomegaly.     Vulva:  normal  Vagina: normal vagina, no discharge, exudate, lesion, or erythema  Cervix:  no cervical motion tenderness and no lesions  Corpus: normal size, contour, position, consistency, mobility, non-tender  Adnexa:  normal adnexa and no mass, fullness, tenderness  Rectal Exam: Not performed.          Assessment:  Post Partum Care visit 1. Encounter for IUD insertion  2. Screening for cervical cancer - Cytology - PAP  3. Postpartum care and examination - Monitor for PPD Edinburgh 12  today Recheck one month Zoloft discussed and Rx option if continues/ worsens Denies SI/HI/neglect today  Plan:  Resume all normal activities Follow up in: 4 weeks or as needed.    IUD PROCEDURE NOTE:  Heather Jacobs is a 24 y.o. 24 here for IUD insertion. No GYN concerns.  Last pap smear was normal, also done again today  IUD Insertion Procedure Note Patient identified, informed consent performed, consent signed.   Discussed risks of irregular bleeding, cramping, infection, malpositioning or misplacement of the IUD outside the uterus which may require further procedure such as laparoscopy, risk of failure <1%. Time out was performed.  Urine pregnancy test negative.  A bimanual exam showed the uterus to be midposition.  Speculum placed in the vagina.  Cervix visualized.  Cleaned with Betadine x 2.  Grasped anteriorly with a single tooth tenaculum.  Uterus sounded to 7 cm.   Mirena IUD placed per manufacturer's recommendations.  Strings trimmed to 3 cm. Tenaculum was removed, good hemostasis noted.  Patient tolerated procedure well.   Patient was given post-procedure instructions.  She was advised to have backup contraception for one week.  Patient was also asked to check IUD strings periodically and follow up in 4 weeks for IUD check.  [id number], MD, Annamarie Major Ob/Gyn, [facility] 07/07/2019  8:46 AM

## 2019-07-08 LAB — CYTOLOGY - PAP
Chlamydia: NEGATIVE
Comment: NEGATIVE
Comment: NORMAL
Diagnosis: NEGATIVE
Neisseria Gonorrhea: NEGATIVE

## 2019-07-21 ENCOUNTER — Encounter: Payer: Self-pay | Admitting: Internal Medicine

## 2019-07-21 ENCOUNTER — Other Ambulatory Visit: Payer: Self-pay

## 2019-07-21 ENCOUNTER — Telehealth (INDEPENDENT_AMBULATORY_CARE_PROVIDER_SITE_OTHER): Payer: Medicaid Other | Admitting: Internal Medicine

## 2019-07-21 VITALS — Ht 68.0 in | Wt 246.0 lb

## 2019-07-21 DIAGNOSIS — R0789 Other chest pain: Secondary | ICD-10-CM

## 2019-07-21 DIAGNOSIS — R002 Palpitations: Secondary | ICD-10-CM | POA: Diagnosis not present

## 2019-07-21 NOTE — Patient Instructions (Signed)

## 2019-07-21 NOTE — Progress Notes (Signed)
Virtual Visit via Video Note   This visit type was conducted due to national recommendations for restrictions regarding the COVID-19 Pandemic (e.g. social distancing) in an effort to limit this patient's exposure and mitigate transmission in our community.  Due to her co-morbid illnesses, this patient is at least at moderate risk for complications without adequate follow up.  This format is felt to be most appropriate for this patient at this time.  All issues noted in this document were discussed and addressed.  A limited physical exam was performed with this format.  Please refer to the patient's chart for her consent to telehealth for [facility].   The patient was identified using 2 identifiers.  Date:  07/21/2019   ID:  Heather Jacobs, DOB [birth year], MRN [id number]  Patient Location: Home Provider Location: Home  PCP:  Patient, No Pcp Per  Cardiologist:  Nelva Bush, MD Electrophysiologist:  None   Evaluation Performed:  Follow-Up Visit  Chief Complaint:  Follow-up palpitations  History of Present Illness:    Heather Jacobs is a 24 y.o. female with history of ectopic pregnancy, who presents for follow-up of chest pain and palpitations.  I last saw [name] in November, at which time she was [redacted] weeks pregnant.  She reported that her palpitations and chest pain were less frequent compared with our prior visit in September.  Preceding monitor had shown rare PACs and PVCs.  Echo was normal other than mild mitral regurgitation.  We agreed to defer additional testing and intervention.  Today, [name] reports feeling well.  She has not had any further palpitations or chest pain since delivering her son.  She was induced at 37 weeks due to pregnancy induced hypertension.  Her blood pressure has since been normal.  She denies shortness of breath and edema.   Past Medical History:  Diagnosis Date  . Ectopic pregnancy   . Ruptured right tubal ectopic pregnancy causing  hemoperitoneum 07/18/2018   Past Surgical History:  Procedure Laterality Date  . ARTHROSCOPIC REPAIR ACL    . DILATION AND EVACUATION  06/27/2018  . LAPAROSCOPY N/A 07/18/2018   Procedure: LAPAROSCOPY DIAGNOSTIC,right salpingostomy;  Surgeon: Gae Dry, MD;  Location: [facility] ORS;  Service: Gynecology;  Laterality: N/A;  ectopic supected to be in blood loss     Current Meds  Medication Sig  . acetaminophen (TYLENOL) 325 MG tablet Take 650 mg by mouth every 6 (six) hours as needed.  . prenatal vitamin w/FE, FA (PRENATAL 1 + 1) 27-1 MG TABS tablet Take 1 tablet by mouth daily at 12 noon.     Allergies:   Chlorhexidine   Social History   Tobacco Use  . Smoking status: Never Smoker  . Smokeless tobacco: Never Used  Substance Use Topics  . Alcohol use: Never  . Drug use: Never     Family Hx: The patient's family history includes Diabetes Mellitus I in her father; Heart defect in her father; Hypothyroidism in her mother.  ROS:   Please see the history of present illness.   All other systems reviewed and are negative.   Prior CV studies:   The following studies were reviewed today:  EP Procedures and Devices:  14-day event monitor (01/26/2019): Predominantly sinus rhythm with rare PAC's and PVC's.  No significant arrhythmia to explain the patient's symptoms.  Non-Invasive Evaluation(s):  TTE (03/04/2019): Normal LV size and wall thickness.  LVEF 60-65% with normal diastolic function.  Normal RV size and function.  No significant valvular  abnormality.  Mild MR was noted.  Labs/Other Tests and Data Reviewed:    EKG:  No ECG reviewed.  Recent Labs: 01/26/2019: TSH 2.560 05/29/2019: ALT 14; BUN 8; Creatinine, Ser 0.65; Potassium 3.5; Sodium 135 05/30/2019: Hemoglobin 10.7; Platelets 136   Recent Lipid Panel No results found for: CHOL, TRIG, HDL, CHOLHDL, LDLCALC, LDLDIRECT  Wt Readings from Last 3 Encounters:  07/21/19 246 lb (111.6 kg)  07/07/19 247 lb (112 kg)    06/09/19 236 lb (107 kg)     Objective:    Vital Signs:  Ht 5\' 8"  (1.727 m)   Wt 246 lb (111.6 kg)   BMI 37.40 kg/m    VITAL SIGNS:  reviewed GEN:  no acute distress  ASSESSMENT & PLAN:    Palpitations and atypical chest pain: Symptoms have resolve since delivering her son ~2 months ago.  Prior workup (echo and event monitor) were reassuring.  No further cardiac testing or intervention is recommended at this time.  Time:   Today, I have spent 5 minutes with the patient with telehealth technology discussing the above problems.     Medication Adjustments/Labs and Tests Ordered: Current medicines are reviewed at length with the patient today.  Concerns regarding medicines are outlined above.   Tests Ordered: None.  Medication Changes: None.  Follow Up:  Either In Person or Virtual prn  Signed, , MD  07/21/2019 9:44 AM    [facility]

## 2019-08-04 ENCOUNTER — Other Ambulatory Visit: Payer: Self-pay | Admitting: Obstetrics & Gynecology

## 2019-08-04 ENCOUNTER — Other Ambulatory Visit: Payer: Self-pay

## 2019-08-04 ENCOUNTER — Other Ambulatory Visit (INDEPENDENT_AMBULATORY_CARE_PROVIDER_SITE_OTHER): Payer: Medicaid Other

## 2019-08-04 ENCOUNTER — Encounter: Payer: Self-pay | Admitting: Obstetrics & Gynecology

## 2019-08-04 ENCOUNTER — Ambulatory Visit (INDEPENDENT_AMBULATORY_CARE_PROVIDER_SITE_OTHER): Payer: Medicaid Other | Admitting: Obstetrics & Gynecology

## 2019-08-04 VITALS — BP 128/80 | Ht 68.0 in | Wt 250.0 lb

## 2019-08-04 DIAGNOSIS — Z30431 Encounter for routine checking of intrauterine contraceptive device: Secondary | ICD-10-CM

## 2019-08-04 NOTE — Progress Notes (Signed)
  History of Present Illness:  Heather Jacobs is a 24 y.o. that had a Mirena IUD placed approximately 4 weeks ago. Since that time, she states that she has had no bleeding and pain  PMHx: She  has a past medical history of Ectopic pregnancy and Ruptured right tubal ectopic pregnancy causing hemoperitoneum (07/18/2018). Also,  has a past surgical history that includes Arthroscopic repair ACL; laparoscopy (N/A, 07/18/2018); and Dilation and evacuation (06/27/2018)., family history includes Diabetes Mellitus I in her father; Heart defect in her father; Hypothyroidism in her mother.,  reports that she has never smoked. She has never used smokeless tobacco. She reports that she does not drink alcohol or use drugs. No outpatient medications have been marked as taking for the 08/04/19 encounter (Appointment) with Nadara Mustard, MD.  .  Also, is allergic to chlorhexidine..  Review of Systems  All other systems reviewed and are negative.   Physical Exam:  There were no vitals taken for this visit. There is no height or weight on file to calculate BMI. Constitutional: Well nourished, well developed female in no acute distress.  Abdomen: diffusely non tender to palpation, non distended, and no masses, hernias Neuro: Grossly intact Psych:  Normal mood and affect.    Pelvic exam:  The IUD strings are not visibly present EGBUS, vaginal vault and cervix: within normal limits  Assessment: IUD strings not present in proper location; pt is doing well  Plan: [place] to confirm presence/ orientation  She was told to continue to use barrier contraception, in order to prevent any STIs, and to take a home pregnancy test or call us if she ever thinks she may be pregnant, and that her IUD expires in 6 years.  No PPD, improved Edinburgh score  She was amenable to this plan and we will see her back in 1 year/PRN.  A total of 20 minutes were spent face-to-face with the patient as well as preparation, review,  communication, and documentation during this encounter.   Annamarie Major, MD, [facility], [facility] 08/04/2019  12:38 PM   ADDENDUM: Review of ULTRASOUND.    I have personally reviewed images and report of recent ultrasound done at [facility].     IUD in uterus correctly oriented  Annamarie Major, MD, [facility], [facility] 08/04/2019  2:33 PM

## 2020-04-12 IMAGING — US OBSTETRIC <14 WK US AND TRANSVAGINAL OB US
1 series · 13 of 28 positions shown · non-contrast
Comparison: None.

CLINICAL DATA: 22-year-old pregnant female presents with vaginal
bleeding and abdominal cramping. Quantitative beta HCG 515.

EDC by LMP: 04/06/2019, projecting to an expected gestational age of
1 week 5 days.
EXAM:
OBSTETRIC <14 WK US AND TRANSVAGINAL OB US
TECHNIQUE: Both transabdominal and transvaginal ultrasound examinations were
performed for complete evaluation of the gestation as well as the
maternal uterus, adnexal regions, and pelvic cul-de-sac.
Transvaginal technique was performed to assess early pregnancy.

[Series 1: obstetric <14 wk us and transvaginal ob us · 0.23mm/px · 13 of 80 slices shown]
[im 3/80]
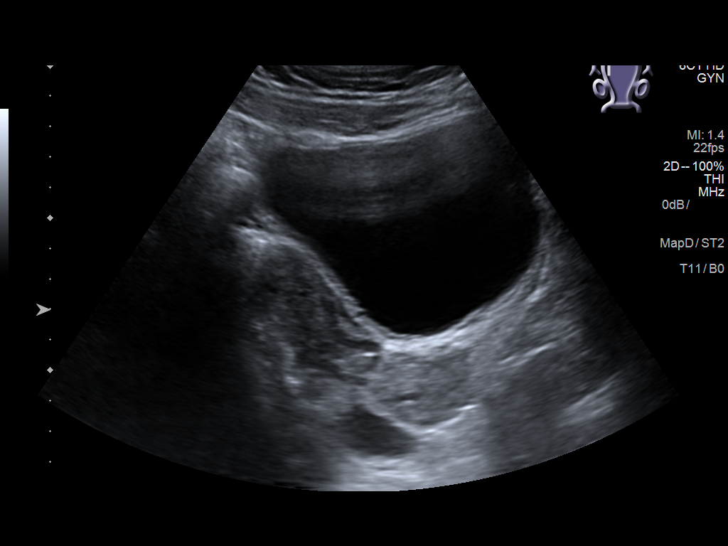
[im 9/80]
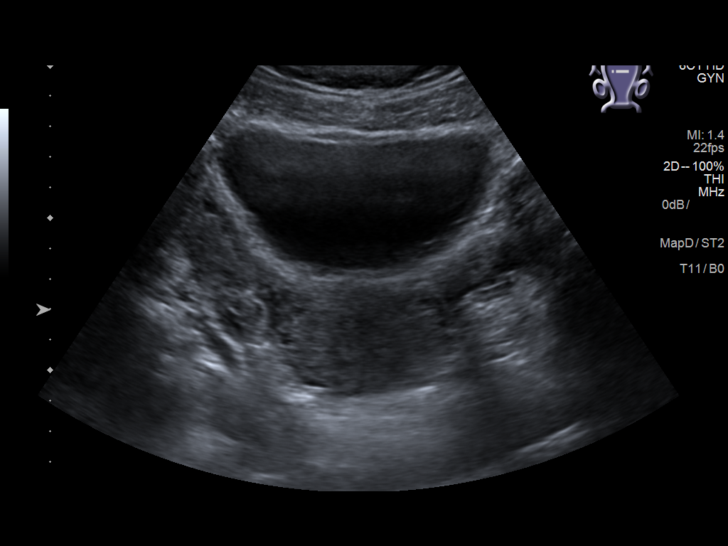
[im 15/80]
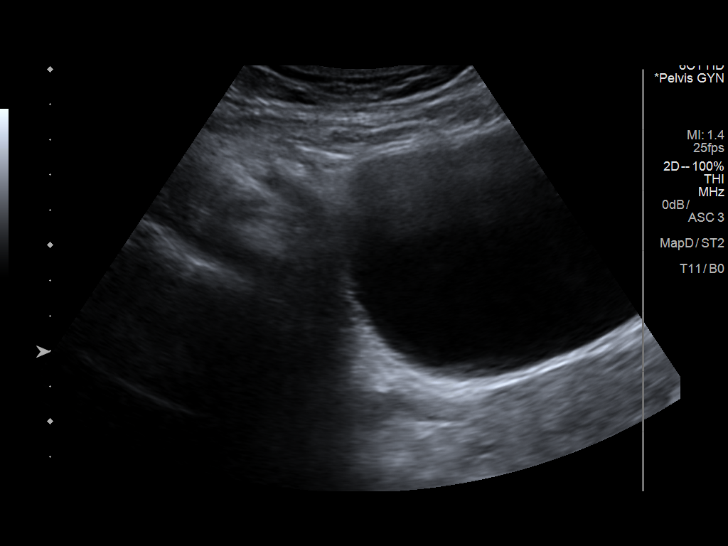
[im 21/80]
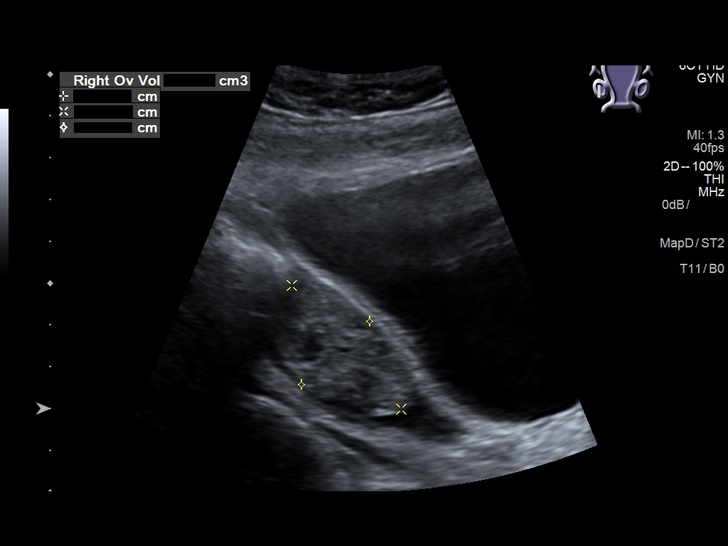
[im 27/80]
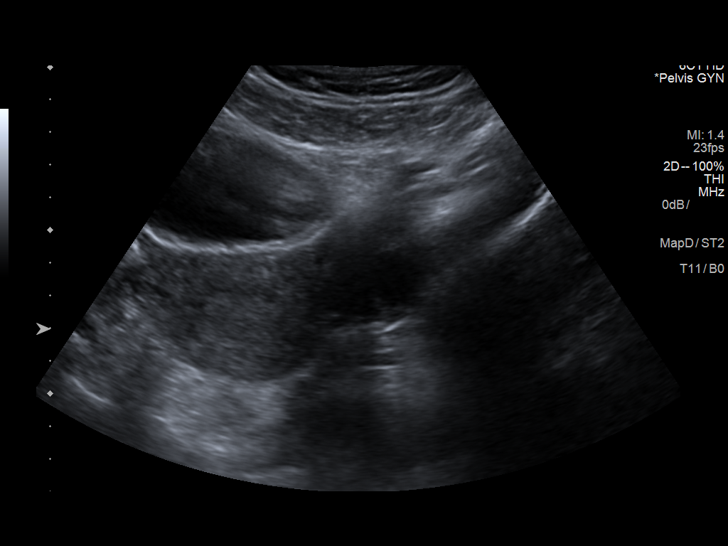
[im 33/80]
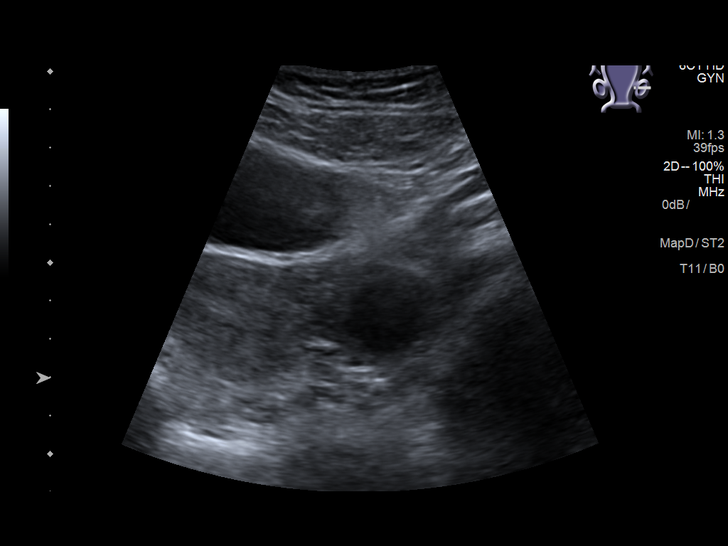
[im 41/80]
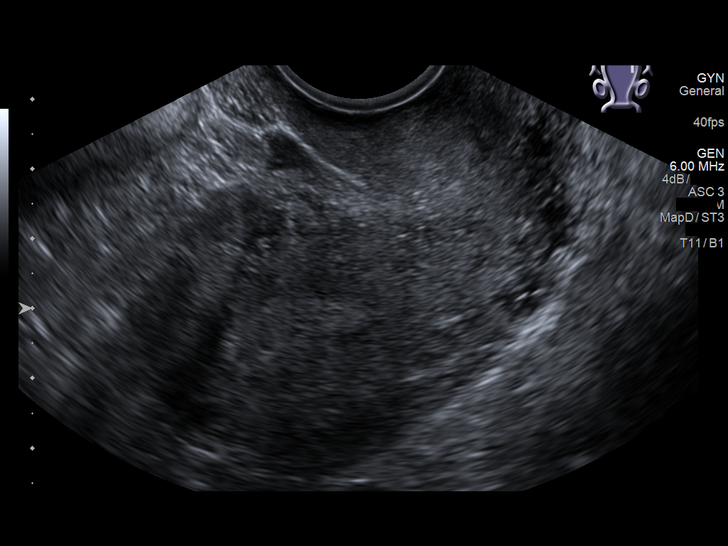
[im 47/80]
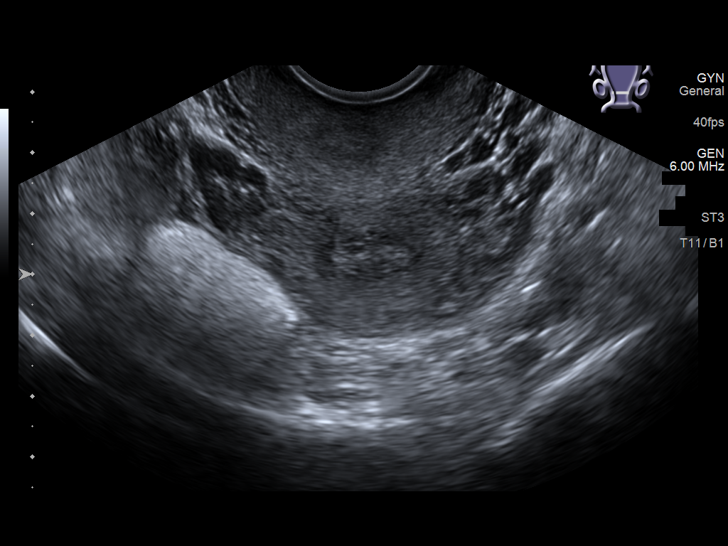
[im 53/80]
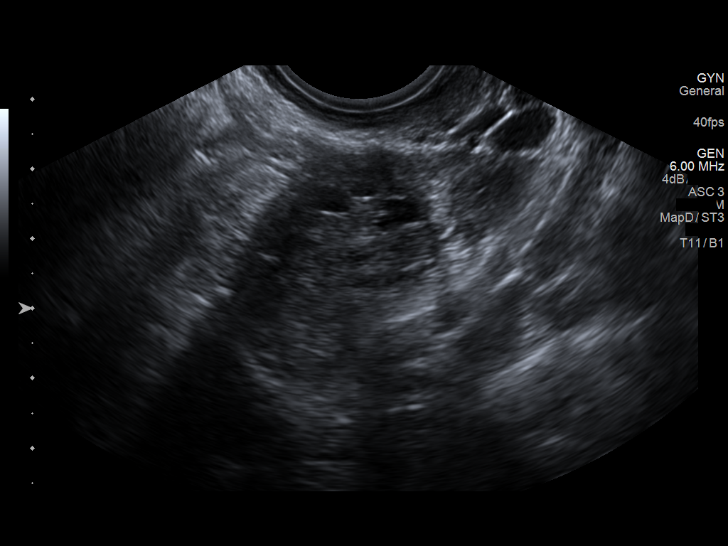
[im 59/80]
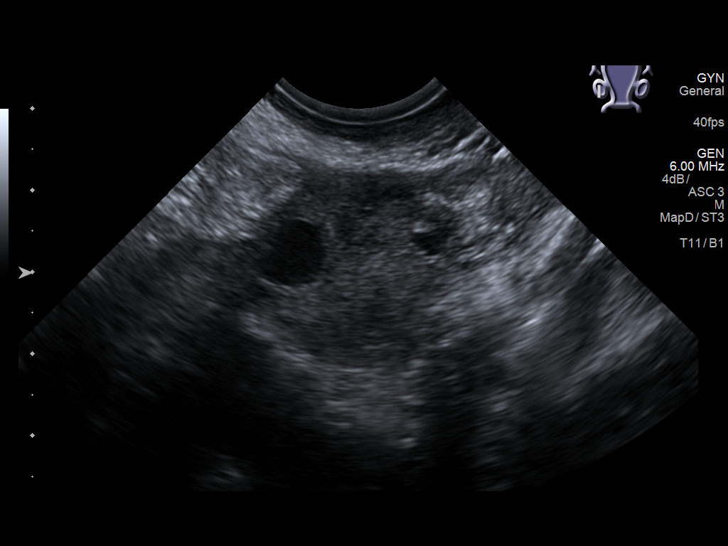
[im 65/80]
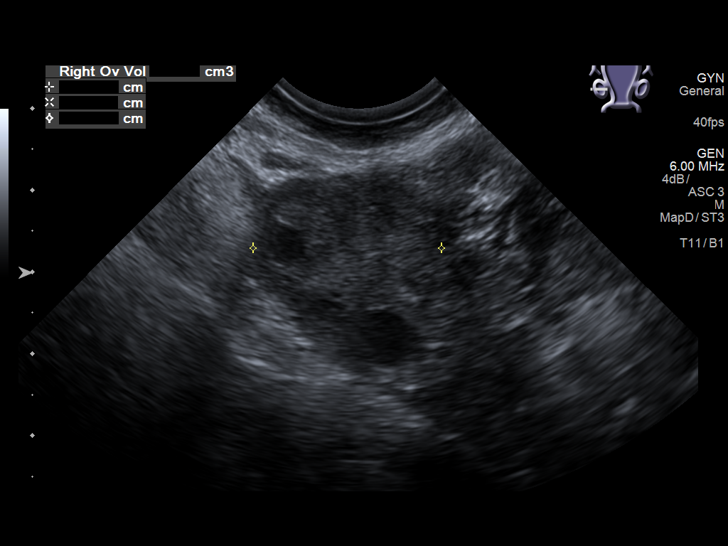
[im 71/80]
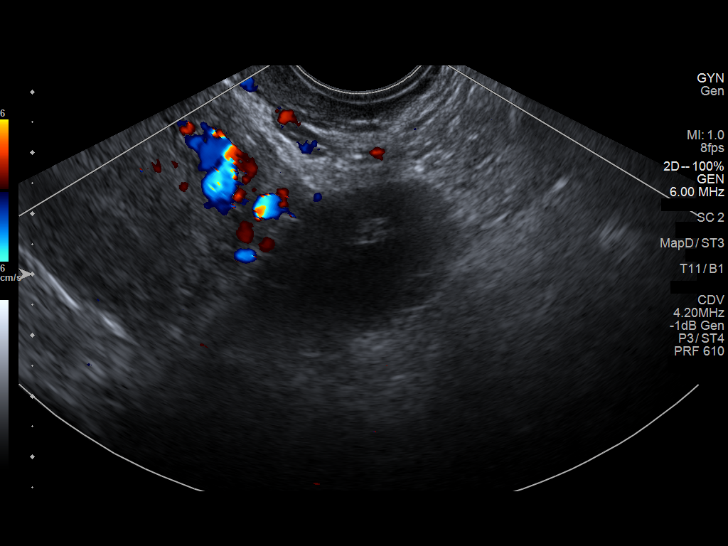
[im 77/80]
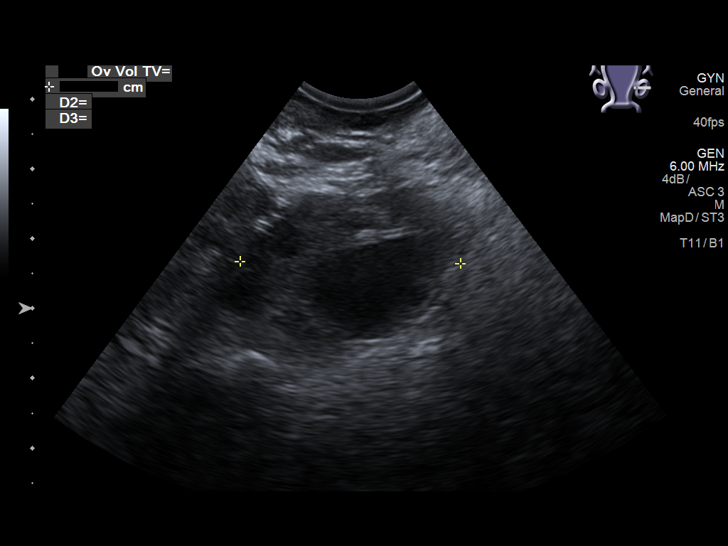

[13 of 28 positions shown; findings below may reference images not displayed]

FINDINGS: Intrauterine gestational sac: None

Maternal uterus/adnexae: Anteverted uterus, with no uterine fibroids
or other myometrial abnormalities. No endometrial cavity fluid or
focal endometrial mass. Right ovary measures 2.8 x 1.8 x 2.3 cm.
Left ovary measures 3.3 x 3.2 x 2.2 cm and contains a dominant
simple 2.1 cm cyst. No suspicious ovarian or adnexal masses. Small
volume simple free fluid in pelvic cul-de-sac.
IMPRESSION: Non-localization of the pregnancy on this scan. No intrauterine
gestational sac. No abnormal ovarian or adnexal masses. Small volume
simple free fluid in the pelvic cul-de-sac. Sonographic differential
diagnosis includes intrauterine gestation too early to visualize,
spontaneous abortion or occult ectopic gestation. Recommend close
clinical follow-up and serial serum beta HCG monitoring, with repeat
obstetric scan as warranted by beta HCG levels and clinical
assessment.

## 2020-06-19 ENCOUNTER — Other Ambulatory Visit: Payer: Self-pay

## 2020-06-19 ENCOUNTER — Ambulatory Visit (INDEPENDENT_AMBULATORY_CARE_PROVIDER_SITE_OTHER): Payer: 59 | Admitting: Obstetrics & Gynecology

## 2020-06-19 ENCOUNTER — Encounter: Payer: Self-pay | Admitting: Obstetrics & Gynecology

## 2020-06-19 VITALS — BP 120/80 | Ht 68.0 in | Wt 264.0 lb

## 2020-06-19 DIAGNOSIS — O99345 Other mental disorders complicating the puerperium: Secondary | ICD-10-CM

## 2020-06-19 DIAGNOSIS — F53 Postpartum depression: Secondary | ICD-10-CM | POA: Diagnosis not present

## 2020-06-19 MED ORDER — SERTRALINE HCL 50 MG PO TABS: 50.0000 mg | ORAL_TABLET | Freq: Every day | ORAL | 9 refills | Status: DC

## 2020-06-19 NOTE — Telephone Encounter (Signed)
Patient is scheduled for 06/19/20 

## 2020-06-19 NOTE — Patient Instructions (Addendum)
Referral List  Psychiatry    Counselors  Heather Elliot, MD   [facility]   [facility] [phone]    [phone]      [address] [place]  Heather Hawking, MD   [facility] [address]  Licensed Professional Counselor, [place], [place]    [phone] [email or web address]       [employer]      Clinical Social Work/Therapist, MSW       [phone]    [facility] [place] [phone]  [facility] [facility] will see patients on a walk-in basis and then will schedule them depending on their needs. [phone] Drs Heather Jacobs and Heather Jacobs are psychiatrists who take Medicaid. They are located in [place], [phone] Heather Jacobs is out of the [facility], is a LCSW who specializes in mental health. She sees children and adults. [phone].     Sertraline Tablets What is this medicine? SERTRALINE (SER tra leen) is used to treat depression. It may also be used to treat obsessive compulsive disorder, panic disorder, post-trauma stress disorder, premenstrual dysphoric disorder (PMDD) or social anxiety. This medicine may be used for other purposes; ask your health care provider or pharmacist if you have questions. COMMON BRAND NAME(S): Zoloft What should I tell my health care provider before I take this medicine? They need to know if you have any of these conditions:  bleeding disorders  bipolar disorder or a family history of bipolar disorder  glaucoma  heart disease  high blood pressure  history of irregular heartbeat  history of low levels of calcium, magnesium, or potassium in the blood  if you often drink alcohol  liver disease  receiving electroconvulsive therapy  seizures  suicidal thoughts, plans, or attempt; a previous suicide attempt by you or a family member  take medicines that treat or prevent blood clots  thyroid disease  an  unusual or allergic reaction to sertraline, other medicines, foods, dyes, or preservatives  pregnant or trying to get pregnant  breast-feeding How should I use this medicine? Take this medicine by mouth with a glass of water. Follow the directions on the prescription label. You can take it with or without food. Take your medicine at regular intervals. Do not take your medicine more often than directed. Do not stop taking this medicine suddenly except upon the advice of your doctor. Stopping this medicine too quickly may cause serious side effects or your condition may worsen. A special MedGuide will be given to you by the pharmacist with each prescription and refill. Be sure to read this information carefully each time. Talk to your pediatrician regarding the use of this medicine in children. While this drug may be prescribed for children as young as 7 years for selected conditions, precautions do apply. Overdosage: If you think you have taken too much of this medicine contact a poison control center or emergency room at once. NOTE: This medicine is only for you. Do not share this medicine with others. What if I miss a dose? If you miss a dose, take it as soon as you can. If it is almost time for your next dose, take only that dose. Do not take double or extra doses. What may interact with this medicine? Do not take this medicine with any of the following medications:  cisapride  dronedarone  linezolid  MAOIs like Carbex, Eldepryl, Marplan, Nardil, and Parnate  methylene blue (  injected into a vein)  pimozide  thioridazine This medicine may also interact with the following medications:  alcohol  amphetamines  aspirin and aspirin-like medicines  certain medicines for depression, anxiety, or psychotic disturbances  certain medicines for fungal infections like ketoconazole, fluconazole, posaconazole, and itraconazole  certain medicines for irregular heart beat like flecainide,  quinidine, propafenone  certain medicines for migraine headaches like almotriptan, eletriptan, frovatriptan, naratriptan, rizatriptan, sumatriptan, zolmitriptan  certain medicines for sleep  certain medicines for seizures like carbamazepine, valproic acid, phenytoin  certain medicines that treat or prevent blood clots like warfarin, enoxaparin, dalteparin  cimetidine  digoxin  diuretics  fentanyl  isoniazid  lithium  NSAIDs, medicines for pain and inflammation, like ibuprofen or naproxen  other medicines that prolong the QT interval (cause an abnormal heart rhythm) like dofetilide  rasagiline  safinamide  supplements like St. John's wort, kava kava, valerian  tolbutamide  tramadol  tryptophan This list may not describe all possible interactions. Give your health care provider a list of all the medicines, herbs, non-prescription drugs, or dietary supplements you use. Also tell them if you smoke, drink alcohol, or use illegal drugs. Some items may interact with your medicine. What should I watch for while using this medicine? Tell your doctor if your symptoms do not get better or if they get worse. Visit your doctor or health care professional for regular checks on your progress. Because it may take several weeks to see the full effects of this medicine, it is important to continue your treatment as prescribed by your doctor. Patients and their families should watch out for new or worsening thoughts of suicide or depression. Also watch out for sudden changes in feelings such as feeling anxious, agitated, panicky, irritable, hostile, aggressive, impulsive, severely restless, overly excited and hyperactive, or not being able to sleep. If this happens, especially at the beginning of treatment or after a change in dose, call your health care professional. [name] may get drowsy or dizzy. Do not drive, use machinery, or do anything that needs mental alertness until you know how this  medicine affects you. Do not stand or sit up quickly, especially if you are an older patient. This reduces the risk of dizzy or fainting spells. Alcohol may interfere with the effect of this medicine. Avoid alcoholic drinks. Your mouth may get dry. Chewing sugarless gum or sucking hard candy, and drinking plenty of water may help. Contact your doctor if the problem does not go away or is severe. What side effects may I notice from receiving this medicine? Side effects that you should report to your doctor or health care professional as soon as possible:  allergic reactions like skin rash, itching or hives, swelling of the face, lips, or tongue  anxious  black, tarry stools  changes in vision  confusion  elevated mood, decreased need for sleep, racing thoughts, impulsive behavior  eye pain  fast, irregular heartbeat  feeling faint or lightheaded, falls  feeling agitated, angry, or irritable  hallucination, loss of contact with reality  loss of balance or coordination  loss of memory  painful or prolonged erections  restlessness, pacing, inability to keep still  seizures  stiff muscles  suicidal thoughts or other mood changes  trouble sleeping  unusual bleeding or bruising  unusually weak or tired  vomiting Side effects that usually do not require medical attention (report to your doctor or health care professional if they continue or are bothersome):  change in appetite or weight  change in  sex drive or performance  diarrhea  increased sweating  indigestion, nausea  tremors This list may not describe all possible side effects. Call your doctor for medical advice about side effects. You may report side effects to FDA at 1-800-FDA-1088. Where should I keep my medicine? Keep out of the reach of children. Store at room temperature between 15 and 30 degrees C (59 and 86 degrees F). Throw away any unused medicine after the expiration date. NOTE: This sheet is  a summary. It may not cover all possible information. If you have questions about this medicine, talk to your doctor, pharmacist, or health care provider.  2021 Elsevier/Gold Standard (2020-02-23 09:18:23)

## 2020-06-19 NOTE — Progress Notes (Addendum)
Subjective:   Heather Jacobs is an 25 y.o. female [phone] WF who presents for evaluation and treatment of depressive symptoms.  Onset approximately 10 months ago, gradually worsening since that time.  She is postpartum 12 months.  Had Mirena IUD placed 10 months ago.  Sx's developed around that time, did not improve w this hormone regimen in the IUD, but she has been unable to return to be evaluated because of insurance and cost concerns.  She is breast feeding. Current symptoms include depressed mood, anhedonia, insomnia, fatigue, feelings of worthlessness/guilt, difficulty concentrating, hopelessness,.  Current treatment for depression:None Other/Psychosocial Stressors: Works but does not feel overwhelmed there, has good support at home from husband and family. Family history positive for depression in the patient's unknown.  Previous treatment modalities employed include None.  Past episodes of depression:never before Organic causes of depression present: None.  Review of Systems Pertinent items are noted in HPI.   Objective:   Mental Status Examination: Posture and motor behavior: Appropriate Dress, grooming, personal hygiene: Appropriate Facial expression: Appropriate Speech: Appropriate Mood: Appropriate and tearful Coherency and relevance of thought: Appropriate Thought content: Appropriate Perceptions: Appropriate Orientation:Appropriate Attention and concentration: Appropriate Memory: : Appropriate Information: Positive Vocabulary: Appropriate Abstract reasoning: Appropriate Judgment: Appropriate    Edinburgh: 27  Assessment:   Experiencing the following symptoms of depression most of the day nearly every day for more than two consecutive weeks: depressed mood  Depressive Disorder:PPD  Suicide Risk Assessment: negative  Plan:   Postpartum depression - Plan: sertraline (ZOLOFT) 50 MG tablet  Reviewed concept of depression as biochemical imbalance of  neurotransmitters and rationale for treatment. Instructed patient to contact office or on-call physician promptly should condition worsen or any new symptoms appear and provided on-call telephone numbers.    List of counselors provided and pt to began counseling therapy. Zoloft started as SSRI therapy.  Pros and cons of medicine dicussed.    Info gv   Follow up 2-3 weeks  Annamarie Major, MD, [facility], [facility] 06/19/2020  2:35 PM

## 2020-07-12 ENCOUNTER — Other Ambulatory Visit (HOSPITAL_COMMUNITY)
Admission: RE | Admit: 2020-07-12 | Discharge: 2020-07-12 | Disposition: A | Discharge status: Home / Self Care | Payer: Medicaid Other | Source: Ambulatory Visit | Attending: Obstetrics & Gynecology | Admitting: Obstetrics & Gynecology

## 2020-07-12 ENCOUNTER — Ambulatory Visit (INDEPENDENT_AMBULATORY_CARE_PROVIDER_SITE_OTHER): Payer: 59 | Admitting: Obstetrics & Gynecology

## 2020-07-12 ENCOUNTER — Other Ambulatory Visit: Payer: Self-pay

## 2020-07-12 ENCOUNTER — Encounter: Payer: Self-pay | Admitting: Obstetrics & Gynecology

## 2020-07-12 VITALS — BP 130/80 | Ht 68.5 in | Wt 267.0 lb

## 2020-07-12 DIAGNOSIS — Z124 Encounter for screening for malignant neoplasm of cervix: Secondary | ICD-10-CM | POA: Insufficient documentation

## 2020-07-12 DIAGNOSIS — Z01419 Encounter for gynecological examination (general) (routine) without abnormal findings: Secondary | ICD-10-CM

## 2020-07-12 NOTE — Progress Notes (Addendum)
HPI:      Heather Jacobs is a 25 y.o. [id number] who LMP was No LMP recorded. (Menstrual status: IUD)., she presents today for her annual examination. The patient has no complaints today.  She feels much improved since starting Zoloft 50 mg daily.  She is starting counseling as well.  The patient is sexually active. Her last pap: approximate date 2021 and was normal. The patient does perform self breast exams.  There is no notable family history of breast or ovarian cancer in her family.  The patient has regular exercise: yes.  The patient denies current symptoms of depression.    GYN History: Contraception: IUD      No periods     Does not check for string     Last visit, [place] assessed IUD in proper orientation  PMHx: Past Medical History:  Diagnosis Date  . Ectopic pregnancy   . Ruptured right tubal ectopic pregnancy causing hemoperitoneum 07/18/2018   Past Surgical History:  Procedure Laterality Date  . ARTHROSCOPIC REPAIR ACL    . DILATION AND EVACUATION  06/27/2018  . LAPAROSCOPY N/A 07/18/2018   Procedure: LAPAROSCOPY DIAGNOSTIC,right salpingostomy;  Surgeon: Nadara Mustard, MD;  Location: [facility] ORS;  Service: Gynecology;  Laterality: N/A;  ectopic supected to be in blood loss   Family History  Problem Relation Age of Onset  . Diabetes Mellitus I Father   . Heart defect Father        Aortic stenosis (born with it)  . Hypothyroidism Mother    Social History   Tobacco Use  . Smoking status: Never Smoker  . Smokeless tobacco: Never Used  Vaping Use  . Vaping Use: Never used  Substance Use Topics  . Alcohol use: Never  . Drug use: Never    Current Outpatient Medications:  .  levonorgestrel (MIRENA) 20 MCG/24HR IUD, 1 each by Intrauterine route once., Disp: , Rfl:  .  sertraline (ZOLOFT) 50 MG tablet, Take 1 tablet (50 mg total) by mouth daily., Disp: 30 tablet, Rfl: 9 Allergies: Chlorhexidine  Review of Systems  Constitutional: Negative for chills, fever and  malaise/fatigue.  HENT: Negative for congestion, sinus pain and sore throat.   Eyes: Negative for blurred vision and pain.  Respiratory: Negative for cough and wheezing.   Cardiovascular: Negative for chest pain and leg swelling.  Gastrointestinal: Negative for abdominal pain, constipation, diarrhea, heartburn, nausea and vomiting.  Genitourinary: Negative for dysuria, frequency, hematuria and urgency.  Musculoskeletal: Negative for back pain, joint pain, myalgias and neck pain.  Skin: Negative for itching and rash.  Neurological: Negative for dizziness, tremors and weakness.  Endo/Heme/Allergies: Does not bruise/bleed easily.  Psychiatric/Behavioral: Negative for depression. The patient is not nervous/anxious and does not have insomnia.     Objective: BP 130/80   Ht 5' 8.5" (1.74 m)   Wt 267 lb (121.1 kg)   BMI 40.01 kg/m   Filed Weights   07/12/20 1436  Weight: 267 lb (121.1 kg)   Body mass index is 40.01 kg/m. Physical Exam Constitutional:      General: She is not in acute distress.    Appearance: She is well-developed.  Genitourinary:     Rectum normal.     No lesions in the vagina.     No vaginal bleeding.      Right Adnexa: not tender and no mass present.    Left Adnexa: not tender and no mass present.    No cervical motion tenderness, friability, lesion or polyp.  No IUD strings visualized.     Uterus is not enlarged.     No uterine mass detected.    Uterus is midaxial.     Pelvic exam was performed with patient in the lithotomy position.  Breasts:     Right: No mass, skin change or tenderness.     Left: No mass, skin change or tenderness.    HENT:     Head: Normocephalic and atraumatic. No laceration.     Right Ear: Hearing normal.     Left Ear: Hearing normal.     Mouth/Throat:     Pharynx: Uvula midline.  Eyes:     Pupils: Pupils are equal, round, and reactive to light.  Neck:     Thyroid: No thyromegaly.  Cardiovascular:     Rate and Rhythm:  Normal rate and regular rhythm.     Heart sounds: No murmur heard. No friction rub. No gallop.   Pulmonary:     Effort: Pulmonary effort is normal. No respiratory distress.     Breath sounds: Normal breath sounds. No wheezing.  Abdominal:     General: Bowel sounds are normal. There is no distension.     Palpations: Abdomen is soft.     Tenderness: There is no abdominal tenderness. There is no rebound.  Musculoskeletal:        General: Normal range of motion.     Cervical back: Normal range of motion and neck supple.  Neurological:     Mental Status: She is alert and oriented to person, place, and time.     Cranial Nerves: No cranial nerve deficit.  Skin:    General: Skin is warm and dry.  Psychiatric:        Judgment: Judgment normal.  Vitals reviewed.     Assessment:  ANNUAL EXAM 1. Women's annual routine gynecological examination   2. Screening for cervical cancer      Screening Plan:            1.  Cervical Screening-  Pap smear done today  2. Breast screening- Exam annually and mammogram>40 planned   3. Colonoscopy every 10 years, Hemoccult testing - after age 66  4. Labs managed by PCP  5. Counseling for contraception: IUD  IUD strings still not seen ([place] last time, correct orientation) As she has not risks or sx's of expulsion and not having periods, will not recheck [place] at this time.    F/U  Return in about 1 year (around 07/12/2021) for Annual.  Annamarie Major, MD, [facility], [facility] 07/12/2020  3:06 PM

## 2020-07-16 LAB — CYTOLOGY - PAP
Chlamydia: NEGATIVE
Comment: NEGATIVE
Comment: NORMAL
Diagnosis: NEGATIVE
Neisseria Gonorrhea: NEGATIVE

## 2020-07-18 ENCOUNTER — Encounter: Payer: Self-pay | Admitting: Obstetrics and Gynecology

## 2020-08-10 IMAGING — US OBSTETRIC <14 WK ULTRASOUND
1 series · 13 of 28 positions shown · non-contrast
Comparison: None.

CLINICAL DATA: 23-year-old pregnant female with right lower
quadrant abdominal pain. LMP: 08/30/2018 corresponding to an
estimated gestational age of 11 weeks, 5 days.



[Series 1: obstetric <14 wk ultrasound · 13 of 32 slices shown]
[im 2/32]
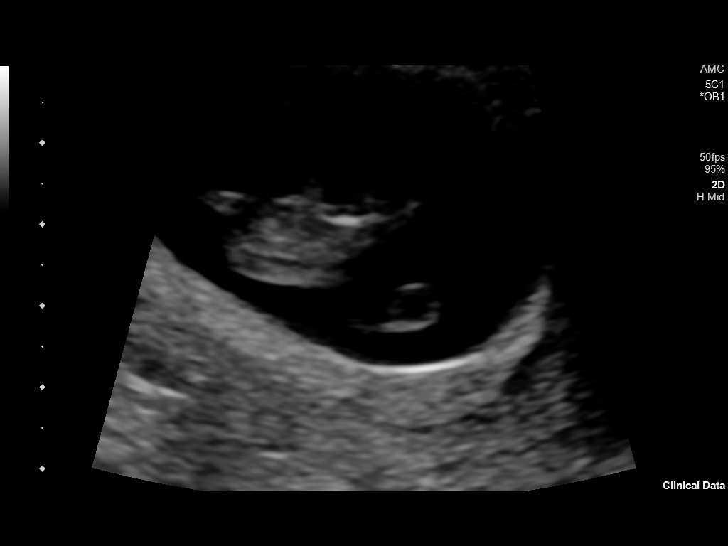
[im 4/32]
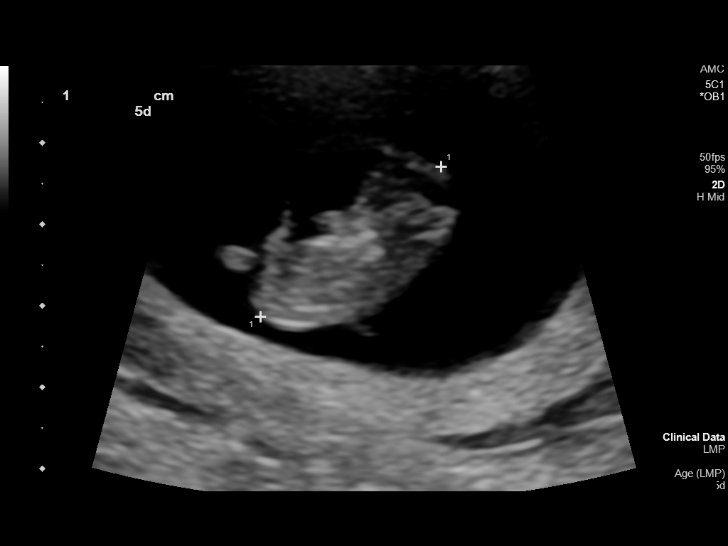
[im 6/32]
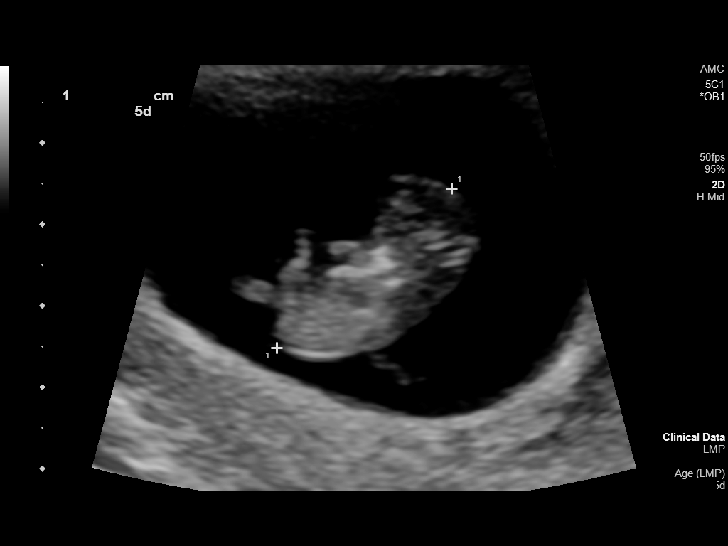
[im 9/32]
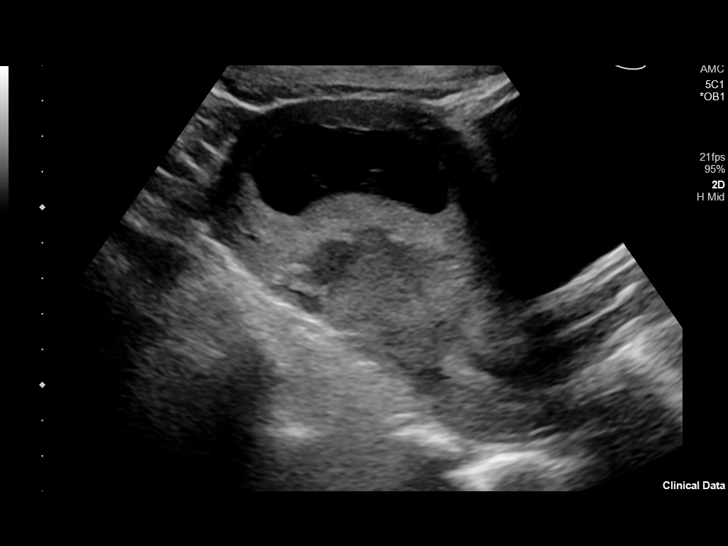
[im 11/32]
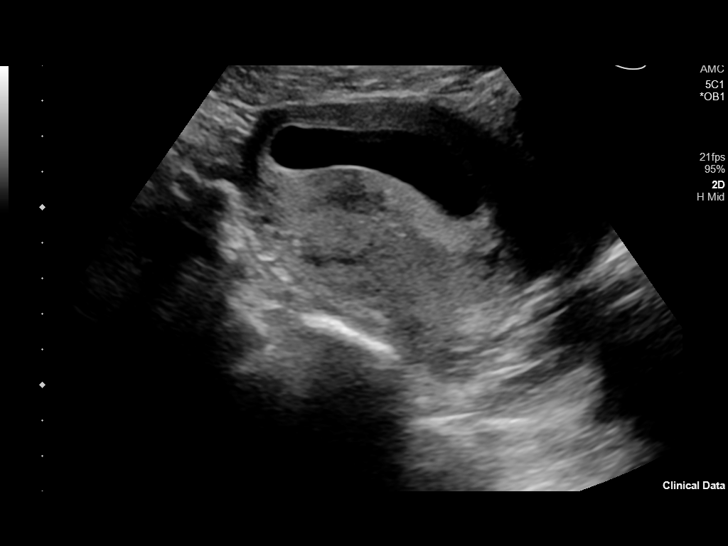
[im 13/32]
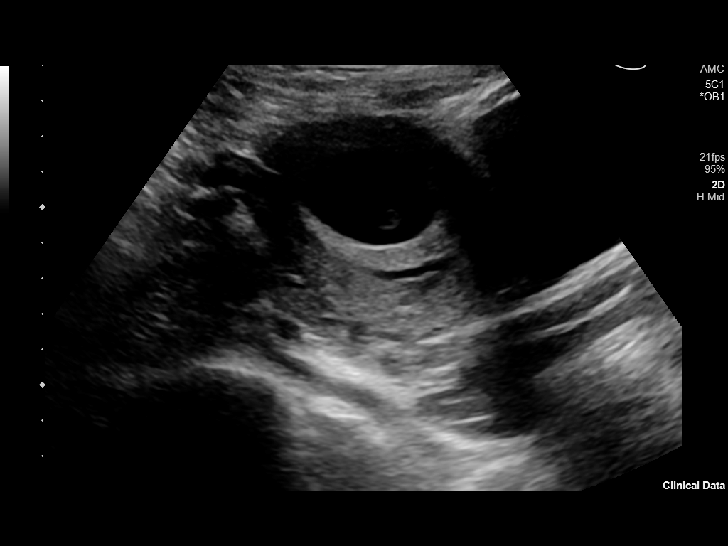
[im 17/32]
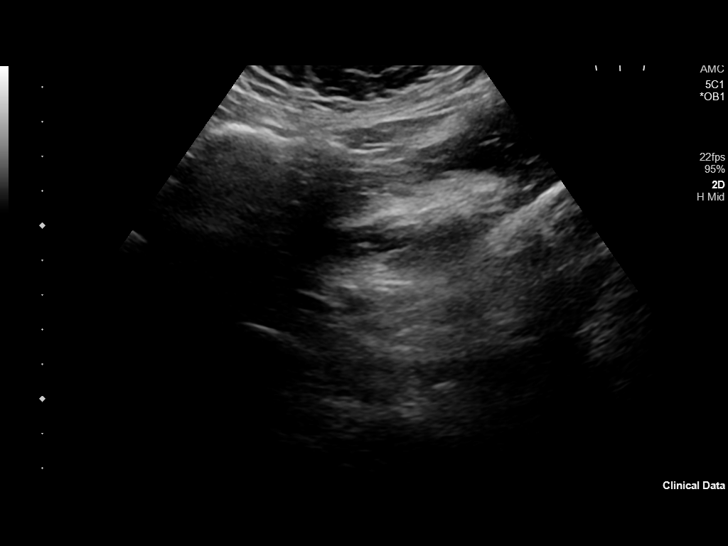
[im 19/32]
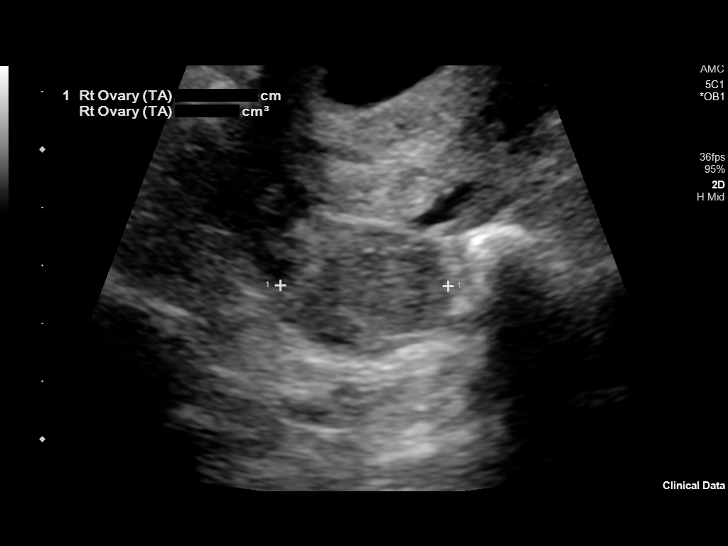
[im 21/32]
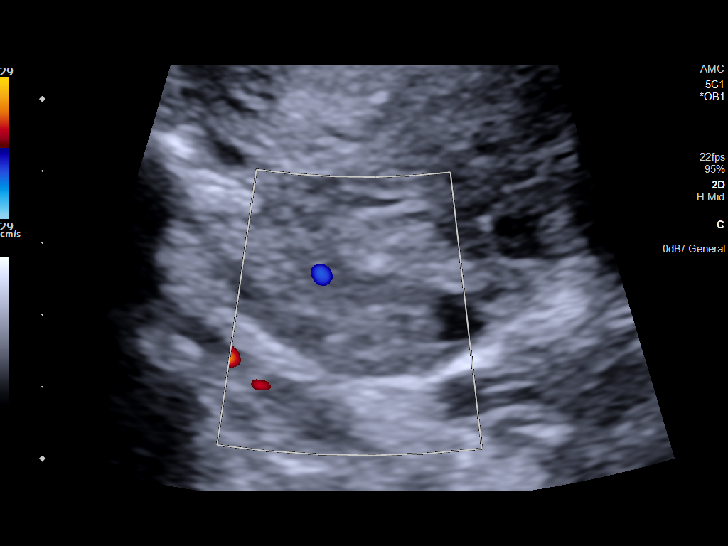
[im 23/32]
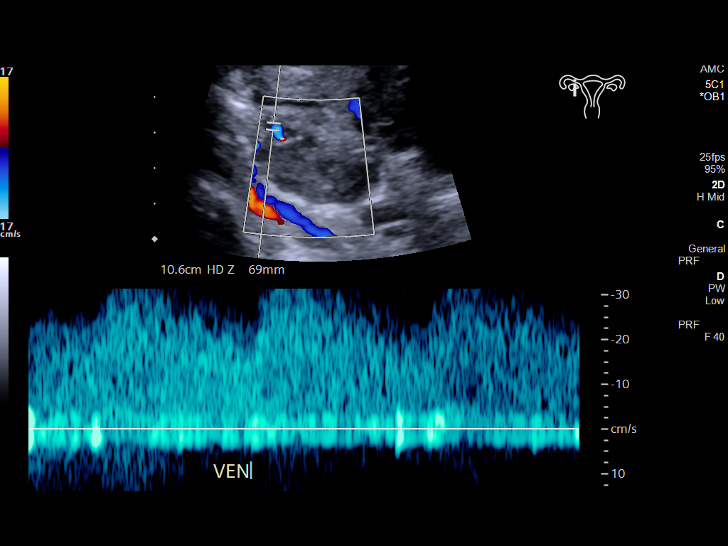
[im 26/32]
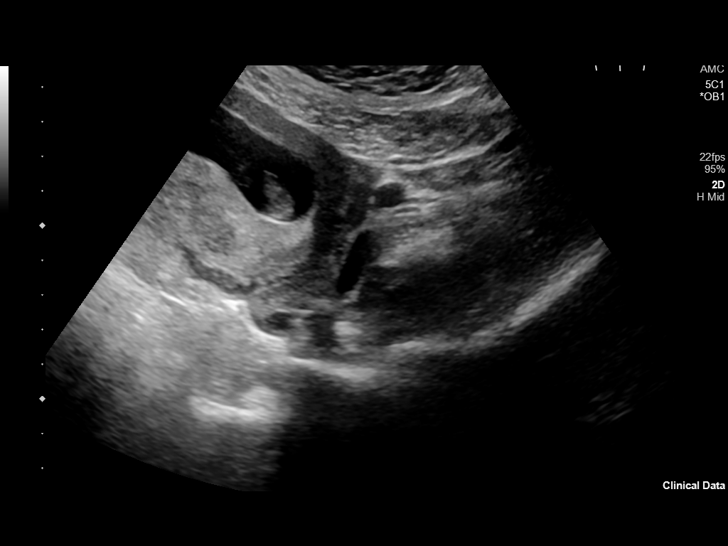
[im 28/32]
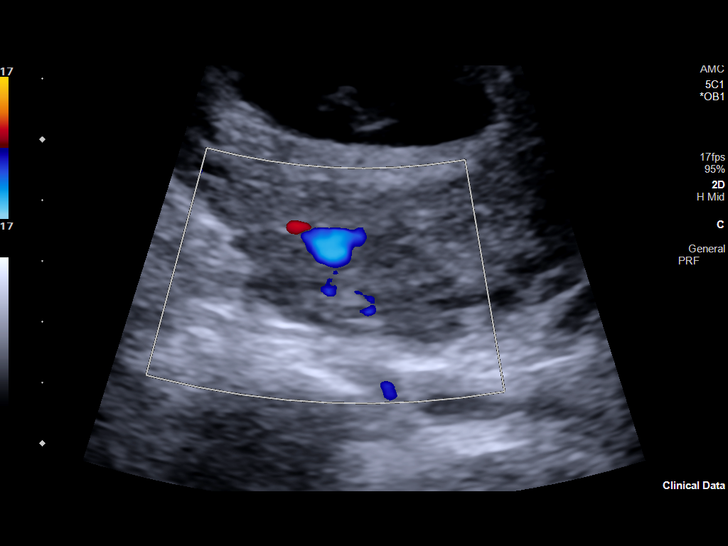
[im 30/32]
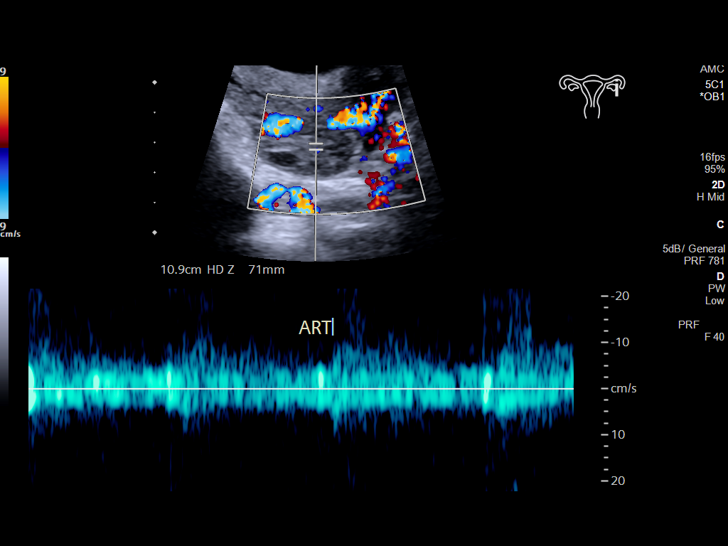

[13 of 28 positions shown; findings below may reference images not displayed]

FINDINGS: Intrauterine gestational sac: Single intrauterine gestational sac.

Yolk sac:  Seen

Embryo:  Present

Cardiac Activity: Detected

Heart Rate: 160 bpm

CRL:   29 mm   9 w 5 d                  US EDC: 06/20/2019

Subchorionic hemorrhage:  None visualized.

Maternal uterus/adnexae: The ovaries are unremarkable.

Pulsed Doppler evaluation of both ovaries demonstrates normal
appearing low-resistance arterial and venous waveforms.
IMPRESSION: 1. Single live intrauterine pregnancy with an estimated gestational
age of 9 weeks, 5 days based on today's ultrasound.
2. Doppler detected flow to both ovaries.

## 2020-09-08 IMAGING — CR CHEST - 2 VIEW
2 series · 2 of 2 positions shown · non-contrast
Comparison: None.

CLINICAL DATA: Upper anterior right side chest pain intermittently
today, non smoker, no hx of heart or lung disease. Pt is 14 weeks
pregnant, consent signed, shielded.

EXAM:
CHEST - 2 VIEW

[chest pa]
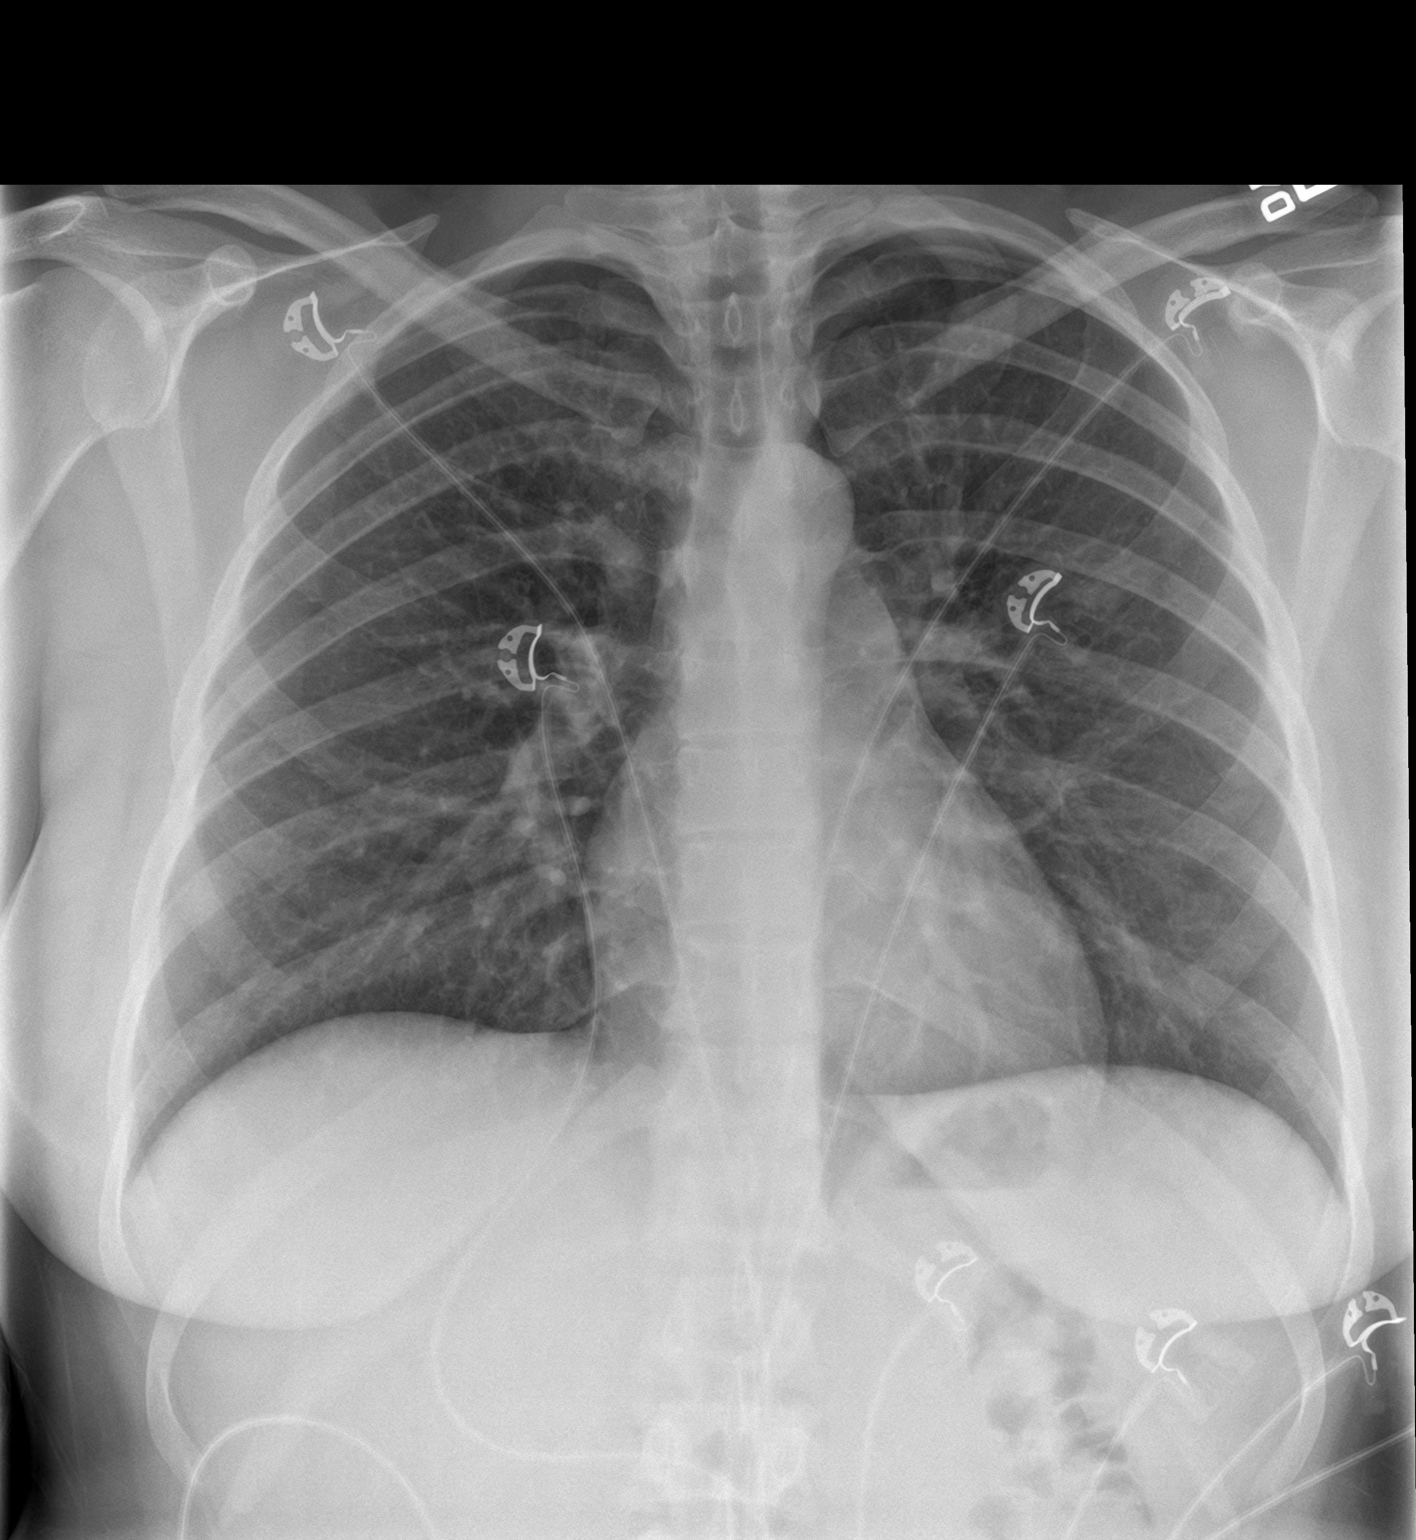

[chest lat]
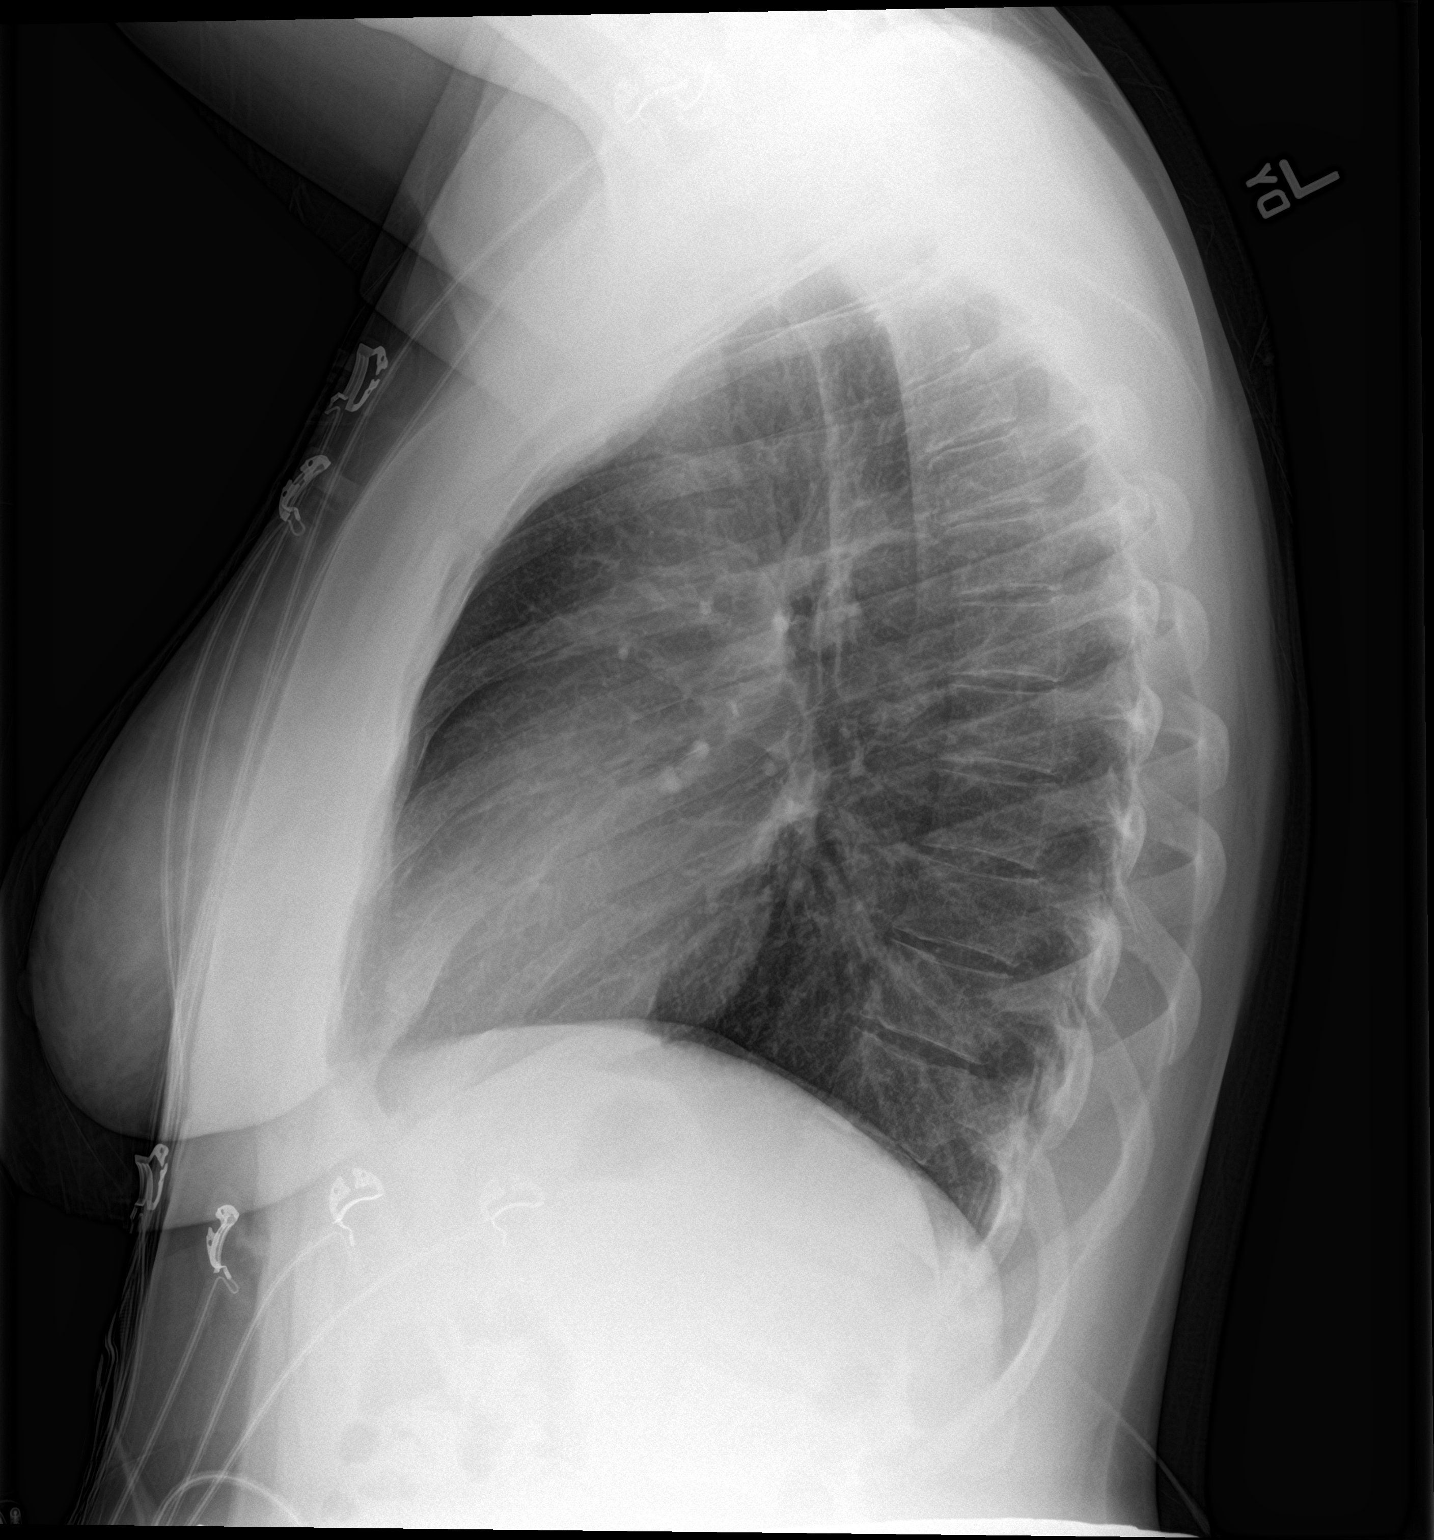

[2 of 2 positions shown; findings below may reference images not displayed]

FINDINGS: The heart size and mediastinal contours are within normal limits.
Both lungs are clear. The visualized skeletal structures are
unremarkable.
IMPRESSION: No active cardiopulmonary disease.

## 2020-09-21 ENCOUNTER — Emergency Department
Admission: EM | Admit: 2020-09-21 | Discharge: 2020-09-21 | Disposition: A | Discharge status: Home / Self Care | Payer: Medicaid Other | Attending: Emergency Medicine | Admitting: Emergency Medicine

## 2020-09-21 ENCOUNTER — Other Ambulatory Visit: Payer: Self-pay

## 2020-09-21 ENCOUNTER — Encounter: Payer: Self-pay | Admitting: Emergency Medicine

## 2020-09-21 DIAGNOSIS — L089 Local infection of the skin and subcutaneous tissue, unspecified: Secondary | ICD-10-CM | POA: Insufficient documentation

## 2020-09-21 MED ORDER — CLINDAMYCIN HCL 300 MG PO CAPS: 300.0000 mg | ORAL_CAPSULE | Freq: Four times a day (QID) | ORAL | 0 refills | Status: DC

## 2020-09-21 MED ORDER — CLINDAMYCIN HCL 150 MG PO CAPS
300.0000 mg | ORAL_CAPSULE | Freq: Once | ORAL | Status: AC
  Administered 2020-09-21: 300 mg via ORAL
  Filled 2020-09-21: qty 2

## 2020-09-21 NOTE — ED Provider Notes (Signed)
[facility] Emergency Department Provider Note  ____________________________________________  Time seen: Approximately 3:36 PM  I have reviewed the triage vital signs and the nursing notes.   HISTORY  Chief Complaint Laceration    HPI Heather Jacobs is a 25 y.o. female who presents the emergency department complaining of a worsening infection to the laceration on her right leg.  She states that she has a laceration to the lateral calf that was sustained roughly 2 weeks ago.  She is taking out some trash and there was a piece of broken glass in the trash that cut through the back and her leg.  Initially the laceration was relatively superficial and she did not feel like she needed stitches.  Patient states that over the past several days she started having increasing pain, erythema and some drainage from the site so she presented to urgent care for evaluation.  They deemed that she had cellulitis and placed her on Keflex, 500 mg twice a day.  Patient states that she has been taking the medicine but she has had a worsening of the erythema, edema and pain.  There is no streaking up the leg.  No other injury or complaint.  Up-to-date on immunizations.         Past Medical History:  Diagnosis Date  . Ectopic pregnancy   . Family history of ovarian cancer    3/22 cancer genetic testing letter sent  . Ruptured right tubal ectopic pregnancy causing hemoperitoneum 07/18/2018    Patient Active Problem List   Diagnosis Date Noted  . Postpartum depression 06/19/2020  . Gestational hypertension 05/29/2019  . Gestational hypertension, third trimester 05/20/2019  . Pregnancy 05/09/2019  . Elevated blood pressure affecting pregnancy in third trimester, antepartum 05/03/2019  . Palpitations 03/17/2019  . Atypical chest pain 03/17/2019  . Supervision of other normal pregnancy, antepartum 11/12/2018    Past Surgical History:  Procedure Laterality Date  . ARTHROSCOPIC  REPAIR ACL    . DILATION AND EVACUATION  06/27/2018  . LAPAROSCOPY N/A 07/18/2018   Procedure: LAPAROSCOPY DIAGNOSTIC,right salpingostomy;  Surgeon: Nadara Mustard, MD;  Location: [facility] ORS;  Service: Gynecology;  Laterality: N/A;  ectopic supected to be in blood loss    Prior to Admission medications   Medication Sig Start Date End Date Taking? Authorizing Provider  clindamycin (CLEOCIN) 300 MG capsule Take 1 capsule (300 mg total) by mouth 4 (four) times daily. 09/21/20  Yes Evert Wenrich, Delorise Royals, PA-C  levonorgestrel (MIRENA) 20 MCG/24HR IUD 1 each by Intrauterine route once.    [provider]  sertraline (ZOLOFT) 50 MG tablet Take 1 tablet (50 mg total) by mouth daily. 06/19/20   Nadara Mustard, MD    Allergies Chlorhexidine  Family History  Problem Relation Age of Onset  . Diabetes Mellitus I Father   . Heart defect Father        Aortic stenosis (born with it)  . Hypothyroidism Mother   . Ovarian cancer Paternal Aunt 68    Social History Social History   Tobacco Use  . Smoking status: Never Smoker  . Smokeless tobacco: Never Used  Vaping Use  . Vaping Use: Never used  Substance Use Topics  . Alcohol use: Never  . Drug use: Never     Review of Systems  Constitutional: No fever/chills Eyes: No visual changes. No discharge ENT: No upper respiratory complaints. Cardiovascular: no chest pain. Respiratory: no cough. No SOB. Gastrointestinal: No abdominal pain.  No nausea, no vomiting.  No  diarrhea.  No constipation. Musculoskeletal: Negative for musculoskeletal pain. Skin: Negative for rash, abrasions, lacerations, ecchymosis. Neurological: Negative for headaches, focal weakness or numbness.  10 System ROS otherwise negative.  ____________________________________________   PHYSICAL EXAM:  VITAL SIGNS: ED Triage Vitals  Enc Vitals Group     BP 09/21/20 1518 (!) 132/96     Pulse Rate 09/21/20 1518 85     Resp 09/21/20 1518 20     Temp 09/21/20  1518 97.9 F (36.6 C)     Temp Source 09/21/20 1518 Oral     SpO2 09/21/20 1518 98 %     Weight 09/21/20 1517 256 lb (116.1 kg)     Height 09/21/20 1517 5\' 8"  (1.727 m)     Head Circumference --      Peak Flow --      Pain Score --      Pain Loc --      Pain Edu? --      Excl. in GC? --      Constitutional: Alert and oriented. Well appearing and in no acute distress. Eyes: Conjunctivae are normal. PERRL. EOMI. Head: Atraumatic. ENT:      Ears:       Nose: No congestion/rhinnorhea.      Mouth/Throat: Mucous membranes are moist.  Neck: No stridor.    Cardiovascular: Normal rate, regular rhythm. Normal S1 and S2.  Good peripheral circulation. Respiratory: Normal respiratory effort without tachypnea or retractions. Lungs CTAB. Good air entry to the bases with no decreased or absent breath sounds. Musculoskeletal: Full range of motion to all extremities. No gross deformities appreciated.  Visualization of the right lower extremity reveals a laceration that appears old to the lateral aspect of the leg.  There is surrounding erythema and edema concerning for cellulitis.  Patient has pictures of the stages of the wound from the initial injury to several days ago to currently.  It does appear that the patient has been worsening with cellulitis even after being placed on Keflex.  There is no fluctuance or induration concerning for underlying abscess.  There is a minimal amount of purulent drainage in the laceration itself but palpation does not express any additional purulence.  Dorsalis pedis pulses sensation intact distally. Neurologic:  Normal speech and language. No gross focal neurologic deficits are appreciated.  Skin:  Skin is warm, dry and intact. No rash noted. Psychiatric: Mood and affect are normal. Speech and behavior are normal. Patient exhibits appropriate insight and judgement.   ____________________________________________   LABS (all labs ordered are listed, but only abnormal  results are displayed)  Labs Reviewed - No data to display ____________________________________________  EKG   ____________________________________________  RADIOLOGY   No results found.  ____________________________________________    PROCEDURES  Procedure(s) performed:    Procedures    Medications  clindamycin (CLEOCIN) capsule 300 mg (300 mg Oral Given 09/21/20 1624)     ____________________________________________   INITIAL IMPRESSION / ASSESSMENT AND PLAN / ED COURSE  Pertinent labs & imaging results that were available during my care of the patient were reviewed by me and considered in my medical decision making (see chart for details).  Review of the [place] CSRS was performed in accordance of the [facility] prior to dispensing any controlled drugs.           Patient's diagnosis is consistent with infected laceration.  Patient presented to the emergency department with concerns for worsening infection to the laceration she sustained 2 weeks ago to her right leg.  Patient had been had concerns of infection and went to urgent care and was started on Keflex.  Patient was only started on 500 mg daily which is definitely underdosed for this antibiotic.  Patient states that the area had been worsening in regards to erythema, edema and pain.  Visualization of patient's pictures on her cell phone as well as visualization of the wound today does appear that the cellulitis has been worsening.  There was no evidence of abscess on exam.  No indication for labs or imaging currently.  Patient will be changed antibiotics to clindamycin, stop the Keflex at this time.  Return precautions discussed with the patient.  Follow-up primary care as needed.  Patient is given ED precautions to return to the ED for any worsening or new symptoms.     ____________________________________________  FINAL CLINICAL IMPRESSION(S) / ED DIAGNOSES  Final diagnoses:  Infected laceration      NEW  MEDICATIONS STARTED DURING THIS VISIT:  ED Discharge Orders         Ordered    clindamycin (CLEOCIN) 300 MG capsule  4 times daily        09/21/20 1653              This chart was dictated using voice recognition software/Dragon. Despite best efforts to proofread, errors can occur which can change the meaning. Any change was purely unintentional.    Racheal Patches, PA-C 09/21/20 1658    Shaune Pollack, MD 09/21/20 1859

## 2020-09-21 NOTE — ED Triage Notes (Signed)
Pt reports cut her right leg while carrying a trash bag out 2 weeks go. Pt reports went to [facility] 2 days ago and they put her on cephalexin and had her f/u in 2 days. Pt reports went back today but had to have an appointment so came here because the cut is not better

## 2020-09-25 ENCOUNTER — Encounter: Payer: Self-pay | Admitting: Emergency Medicine

## 2020-09-25 ENCOUNTER — Other Ambulatory Visit: Payer: Self-pay

## 2020-09-25 ENCOUNTER — Emergency Department
Admission: EM | Admit: 2020-09-25 | Discharge: 2020-09-25 | Disposition: A | Discharge status: Home / Self Care | Payer: Medicaid Other | Attending: Emergency Medicine | Admitting: Emergency Medicine

## 2020-09-25 DIAGNOSIS — R21 Rash and other nonspecific skin eruption: Secondary | ICD-10-CM | POA: Diagnosis not present

## 2020-09-25 DIAGNOSIS — T368X5A Adverse effect of other systemic antibiotics, initial encounter: Secondary | ICD-10-CM | POA: Diagnosis not present

## 2020-09-25 DIAGNOSIS — T7840XA Allergy, unspecified, initial encounter: Secondary | ICD-10-CM

## 2020-09-25 MED ORDER — AMOXICILLIN-POT CLAVULANATE 875-125 MG PO TABS: 1.0000 | ORAL_TABLET | Freq: Two times a day (BID) | ORAL | 0 refills | Status: DC

## 2020-09-25 MED ORDER — DIPHENHYDRAMINE HCL 25 MG PO CAPS: 25.0000 mg | ORAL_CAPSULE | Freq: Four times a day (QID) | ORAL | 0 refills | Status: DC | PRN

## 2020-09-25 NOTE — ED Notes (Signed)
See triage note  Presents with rash  States she developed rash to back,abd arms and legs  Positive itching  No resp distress  States she was recently started on antibiotics

## 2020-09-25 NOTE — ED Triage Notes (Signed)
C/O itchy rash to legs and body today.  Recently started on antibiotics.  AAOx3.  Skin warm and dry. NAD

## 2020-09-25 NOTE — Discharge Instructions (Addendum)
Stop clindamycin and start Augmentin and Benadryl.

## 2020-09-25 NOTE — ED Provider Notes (Signed)
[facility] Emergency Department Provider Note   ____________________________________________   Event Date/Time   First MD Initiated Contact with Patient 09/25/20 [id number]     (approximate)  I have reviewed the triage vital signs and the nursing notes.   HISTORY  Chief Complaint Allergic Reaction    HPI Heather Jacobs is a 25 y.o. female patient presents with suspected drug rash secondary to starting clindamycin.  Patient has laceration by glass cut to the right lower leg which occurred approximately 2 and half weeks ago.  Patient went to an urgent care clinic and was placed on clindamycin 500 mg twice a day.  Patient stated medicine does not seem to be working and she came to the emergency room on 09/21/2020 and was given prescription for clindamycin.  Patient states she waking this morning with a diffuse rash and itching.  Patient said no additional medicines, different food, personal hygiene, or laundry products.  Patient voiced concern secondary to breast-feeding.  Denies angioedema or any other anaphylactic signs or symptoms.  No previous history of use of clindamycin.         Past Medical History:  Diagnosis Date  . Ectopic pregnancy   . Family history of ovarian cancer    3/22 cancer genetic testing letter sent  . Ruptured right tubal ectopic pregnancy causing hemoperitoneum 07/18/2018    Patient Active Problem List   Diagnosis Date Noted  . Postpartum depression 06/19/2020  . Gestational hypertension 05/29/2019  . Gestational hypertension, third trimester 05/20/2019  . Pregnancy 05/09/2019  . Elevated blood pressure affecting pregnancy in third trimester, antepartum 05/03/2019  . Palpitations 03/17/2019  . Atypical chest pain 03/17/2019  . Supervision of other normal pregnancy, antepartum 11/12/2018    Past Surgical History:  Procedure Laterality Date  . ARTHROSCOPIC REPAIR ACL    . DILATION AND EVACUATION  06/27/2018  . LAPAROSCOPY N/A  07/18/2018   Procedure: LAPAROSCOPY DIAGNOSTIC,right salpingostomy;  Surgeon: Nadara Mustard, MD;  Location: [facility] ORS;  Service: Gynecology;  Laterality: N/A;  ectopic supected to be in blood loss    Prior to Admission medications   Medication Sig Start Date End Date Taking? Authorizing Provider  amoxicillin-clavulanate (AUGMENTIN) 875-125 MG tablet Take 1 tablet by mouth 2 (two) times daily for 10 days. 09/25/20 10/05/20 Yes Joni Reining, PA-C  diphenhydrAMINE (BENADRYL ALLERGY) 25 mg capsule Take 1 capsule (25 mg total) by mouth every 6 (six) hours as needed. 09/25/20  Yes Joni Reining, PA-C  clindamycin (CLEOCIN) 300 MG capsule Take 1 capsule (300 mg total) by mouth 4 (four) times daily. 09/21/20   Cuthriell, Delorise Royals, PA-C  levonorgestrel (MIRENA) 20 MCG/24HR IUD 1 each by Intrauterine route once.    [provider]  sertraline (ZOLOFT) 50 MG tablet Take 1 tablet (50 mg total) by mouth daily. 06/19/20   Nadara Mustard, MD    Allergies Chlorhexidine and Clindamycin/lincomycin  Family History  Problem Relation Age of Onset  . Diabetes Mellitus I Father   . Heart defect Father        Aortic stenosis (born with it)  . Hypothyroidism Mother   . Ovarian cancer Paternal Aunt 78    Social History Social History   Tobacco Use  . Smoking status: Never Smoker  . Smokeless tobacco: Never Used  Vaping Use  . Vaping Use: Never used  Substance Use Topics  . Alcohol use: Never  . Drug use: Never    Review of Systems Constitutional: No fever/chills Eyes: No  visual changes. ENT: No sore throat. Cardiovascular: Denies chest pain. Respiratory: Denies shortness of breath. Gastrointestinal: No abdominal pain.  No nausea, no vomiting.  No diarrhea.  No constipation. Genitourinary: Negative for dysuria. Musculoskeletal: Negative for back pain. Skin: Negative for rash.  Wound infection right lower leg. Neurological: Negative for headaches, focal weakness or  numbness. Allergic/Immunilogical: Chlorhexidine ____________________________________________   PHYSICAL EXAM:  VITAL SIGNS: ED Triage Vitals  Enc Vitals Group     BP 09/25/20 0903 119/78     Pulse Rate 09/25/20 0903 74     Resp 09/25/20 0903 18     Temp 09/25/20 0903 98 F (36.7 C)     Temp Source 09/25/20 0903 Oral     SpO2 09/25/20 0903 99 %     Weight 09/25/20 0855 255 lb 11.7 oz (116 kg)     Height 09/25/20 0855 5\' 8"  (1.727 m)     Head Circumference --      Peak Flow --      Pain Score 09/25/20 0855 0     Pain Loc --      Pain Edu? --      Excl. in GC? --    Constitutional: Alert and oriented. Well appearing and in no acute distress.  Afebrile Cardiovascular: Normal rate, regular rhythm. Grossly normal heart sounds.  Good peripheral circulation. Respiratory: Normal respiratory effort.  No retractions. Lungs CTAB. Genitourinary: Deferred Musculoskeletal: No lower extremity tenderness nor edema.  No joint effusions. Neurologic:  Normal speech and language. No gross focal neurologic deficits are appreciated. No gait instability. Skin: Mild cellulitis with no apparent drainage.09/27/20 Psychiatric: Mood and affect are normal. Speech and behavior are normal.  ____________________________________________   LABS (all labs ordered are listed, but only abnormal results are displayed)  Labs Reviewed - No data to display ____________________________________________  EKG   ____________________________________________  RADIOLOGY I, Marland Kitchen, personally viewed and evaluated these images (plain radiographs) as part of my medical decision making, as well as reviewing the written report by the radiologist.  ED MD interpretation:    Official radiology report(s): No results found.  ____________________________________________   PROCEDURES  Procedure(s) performed (including Critical Care):  Procedures   ____________________________________________   INITIAL  IMPRESSION / ASSESSMENT AND PLAN / ED COURSE  As part of my medical decision making, I reviewed the following data within the electronic MEDICAL RECORD NUMBER         Patient presents with suspected allergic reaction to clindamycin.  Patient advised to DC medication.  Patient placed on Augmentin and Benadryl.  Patient advised to follow-up with PCP.      ____________________________________________   FINAL CLINICAL IMPRESSION(S) / ED DIAGNOSES  Final diagnoses:  Allergic reaction to drug, initial encounter     ED Discharge Orders         Ordered    amoxicillin-clavulanate (AUGMENTIN) 875-125 MG tablet  2 times daily        09/25/20 0936    diphenhydrAMINE (BENADRYL ALLERGY) 25 mg capsule  Every 6 hours PRN        09/25/20 0936           Note:  This document was prepared using Dragon voice recognition software and may include unintentional dictation errors.    09/27/20, PA-C 09/25/20 09/27/20    0240, MD 09/25/20 [phone]

## 2020-09-27 ENCOUNTER — Other Ambulatory Visit: Payer: Self-pay

## 2020-09-27 ENCOUNTER — Emergency Department
Admission: EM | Admit: 2020-09-27 | Discharge: 2020-09-27 | Disposition: A | Discharge status: Home / Self Care | Payer: Medicaid Other | Attending: Emergency Medicine | Admitting: Emergency Medicine

## 2020-09-27 DIAGNOSIS — M7989 Other specified soft tissue disorders: Secondary | ICD-10-CM | POA: Diagnosis present

## 2020-09-27 DIAGNOSIS — L03115 Cellulitis of right lower limb: Secondary | ICD-10-CM | POA: Insufficient documentation

## 2020-09-27 LAB — COMPREHENSIVE METABOLIC PANEL
ALT: 21 U/L (ref 0–44)
AST: 22 U/L (ref 15–41)
Albumin: 4.3 g/dL (ref 3.5–5.0)
Alkaline Phosphatase: 84 U/L (ref 38–126)
Anion gap: 8 (ref 5–15)
BUN: 12 mg/dL (ref 6–20)
CO2: 23 mmol/L (ref 22–32)
Calcium: 9.3 mg/dL (ref 8.9–10.3)
Chloride: 106 mmol/L (ref 98–111)
Creatinine, Ser: 0.67 mg/dL (ref 0.44–1.00)
GFR, Estimated: >60 mL/min (ref >60)
Glucose, Bld: 97 mg/dL (ref 70–99)
Potassium: 4 mmol/L (ref 3.5–5.1)
Sodium: 137 mmol/L (ref 135–145)
Total Bilirubin: 0.5 mg/dL (ref 0.3–1.2)
Total Protein: 7.6 g/dL (ref 6.5–8.1)

## 2020-09-27 LAB — CBC WITH DIFFERENTIAL/PLATELET
Abs Immature Granulocytes: 0.02 10*3/uL (ref 0.00–0.07)
Basophils Absolute: 0 10*3/uL (ref 0.0–0.1)
Basophils Relative: 1 %
Eosinophils Absolute: 0.2 10*3/uL (ref 0.0–0.5)
Eosinophils Relative: 3 %
HCT: 40.1 % (ref 36.0–46.0)
Hemoglobin: 13.5 g/dL (ref 12.0–15.0)
Immature Granulocytes: 0 %
Lymphocytes Relative: 21 %
Lymphs Abs: 1.1 10*3/uL (ref 0.7–4.0)
MCH: 28.4 pg (ref 26.0–34.0)
MCHC: 33.7 g/dL (ref 30.0–36.0)
MCV: 84.4 fL (ref 80.0–100.0)
Monocytes Absolute: 0.4 10*3/uL (ref 0.1–1.0)
Monocytes Relative: 8 %
Neutro Abs: 3.5 10*3/uL (ref 1.7–7.7)
Neutrophils Relative %: 67 %
Platelets: 224 10*3/uL (ref 150–400)
RBC: 4.75 MIL/uL (ref 3.87–5.11)
RDW: 12.5 % (ref 11.5–15.5)
WBC: 5.3 10*3/uL (ref 4.0–10.5)
nRBC: 0 % (ref 0.0–0.2)

## 2020-09-27 LAB — LACTIC ACID, PLASMA: Lactic Acid, Venous: 0.6 mmol/L (ref 0.5–1.9)

## 2020-09-27 MED ORDER — DEXTROSE 5 % IV SOLN
1500.0000 mg | Freq: Once | INTRAVENOUS | Status: AC
  Administered 2020-09-27: 1500 mg via INTRAVENOUS
  Filled 2020-09-27: qty 75

## 2020-09-27 NOTE — ED Notes (Signed)
Called pharmacy related to delay in antibiotics, state they are sending soon.

## 2020-09-27 NOTE — Discharge Instructions (Addendum)
As we discussed, you can stop the antibiotics previously prescribed  Return to the ER if swelling/redness continues to worsen after 24 hours  Drink plenty of fluids  Per [employer] resource, it is OK to continue breastfeeding, though the manufacturer states dedicated studies have not yet been completed.

## 2020-09-27 NOTE — ED Provider Notes (Signed)
[facility] Emergency Department Provider Note  ____________________________________________   Event Date/Time   First MD Initiated Contact with Patient 09/27/20 1436     (approximate)  I have reviewed the triage vital signs and the nursing notes.   HISTORY  Chief Complaint Cellulitis    HPI Heather Jacobs is a 25 y.o. female here with recurrent leg swelling and pain.  The patient has been seen multiple times for recent leg infection of the right calf.  She was previously given Keflex, then switched to clindamycin which seem to be improving.  However, she had an allergic reaction and was switched to Augmentin.  Since then, she has had recurrence and worsening of the redness.  She has had some nausea and abdominal discomfort since starting the Augmentin.  She does not recall any fevers but has had some chills.  She states that the redness is worsened over the last 24 hours so she was told to come to the ED.  Denies any drainage from the area.  Did not distal numbness or weakness.  She has mild, aching, throbbing pain in the area.  No other complaints.        Past Medical History:  Diagnosis Date  . Ectopic pregnancy   . Family history of ovarian cancer    3/22 cancer genetic testing letter sent  . Ruptured right tubal ectopic pregnancy causing hemoperitoneum 07/18/2018    Patient Active Problem List   Diagnosis Date Noted  . Postpartum depression 06/19/2020  . Gestational hypertension 05/29/2019  . Gestational hypertension, third trimester 05/20/2019  . Pregnancy 05/09/2019  . Elevated blood pressure affecting pregnancy in third trimester, antepartum 05/03/2019  . Palpitations 03/17/2019  . Atypical chest pain 03/17/2019  . Supervision of other normal pregnancy, antepartum 11/12/2018    Past Surgical History:  Procedure Laterality Date  . ARTHROSCOPIC REPAIR ACL    . DILATION AND EVACUATION  06/27/2018  . LAPAROSCOPY N/A 07/18/2018   Procedure:  LAPAROSCOPY DIAGNOSTIC,right salpingostomy;  Surgeon: Nadara Mustard, MD;  Location: [facility] ORS;  Service: Gynecology;  Laterality: N/A;  ectopic supected to be in blood loss    Prior to Admission medications   Medication Sig Start Date End Date Taking? Authorizing Provider  amoxicillin-clavulanate (AUGMENTIN) 875-125 MG tablet Take 1 tablet by mouth 2 (two) times daily for 10 days. 09/25/20 10/05/20  Joni Reining, PA-C  clindamycin (CLEOCIN) 300 MG capsule Take 1 capsule (300 mg total) by mouth 4 (four) times daily. 09/21/20   Cuthriell, Delorise Royals, PA-C  diphenhydrAMINE (BENADRYL ALLERGY) 25 mg capsule Take 1 capsule (25 mg total) by mouth every 6 (six) hours as needed. 09/25/20   Joni Reining, PA-C  levonorgestrel (MIRENA) 20 MCG/24HR IUD 1 each by Intrauterine route once.    [provider]  sertraline (ZOLOFT) 50 MG tablet Take 1 tablet (50 mg total) by mouth daily. 06/19/20   Nadara Mustard, MD    Allergies Chlorhexidine and Clindamycin/lincomycin  Family History  Problem Relation Age of Onset  . Diabetes Mellitus I Father   . Heart defect Father        Aortic stenosis (born with it)  . Hypothyroidism Mother   . Ovarian cancer Paternal Aunt 87    Social History Social History   Tobacco Use  . Smoking status: Never Smoker  . Smokeless tobacco: Never Used  Vaping Use  . Vaping Use: Never used  Substance Use Topics  . Alcohol use: Never  . Drug use: Never  Review of Systems  Review of Systems  Constitutional: Negative for fatigue and fever.  HENT: Negative for congestion and sore throat.   Eyes: Negative for visual disturbance.  Respiratory: Negative for cough and shortness of breath.   Cardiovascular: Negative for chest pain.  Gastrointestinal: Negative for abdominal pain, diarrhea, nausea and vomiting.  Genitourinary: Negative for flank pain.  Musculoskeletal: Negative for back pain and neck pain.  Skin: Positive for rash and wound.  Neurological:  Negative for weakness.  All other systems reviewed and are negative.    ____________________________________________  PHYSICAL EXAM:      VITAL SIGNS: ED Triage Vitals  Enc Vitals Group     BP 09/27/20 1237 123/87     Pulse Rate 09/27/20 1237 86     Resp 09/27/20 1237 18     Temp 09/27/20 1237 98.2 F (36.8 C)     Temp Source 09/27/20 1237 Oral     SpO2 09/27/20 1237 98 %     Weight --      Height --      Head Circumference --      Peak Flow --      Pain Score 09/27/20 1238 6     Pain Loc --      Pain Edu? --      Excl. in GC? --      Physical Exam Vitals and nursing note reviewed.  Constitutional:      General: She is not in acute distress.    Appearance: She is well-developed.  HENT:     Head: Normocephalic and atraumatic.  Eyes:     Conjunctiva/sclera: Conjunctivae normal.  Cardiovascular:     Rate and Rhythm: Normal rate and regular rhythm.     Heart sounds: Normal heart sounds. No murmur heard. No friction rub.  Pulmonary:     Effort: Pulmonary effort is normal. No respiratory distress.     Breath sounds: Normal breath sounds. No wheezing or rales.  Abdominal:     General: There is no distension.     Palpations: Abdomen is soft.     Tenderness: There is no abdominal tenderness.  Musculoskeletal:     Cervical back: Neck supple.  Skin:    General: Skin is warm.       Capillary Refill: Capillary refill takes less than 2 seconds.  Neurological:     Mental Status: She is alert and oriented to person, place, and time.     Motor: No abnormal muscle tone.      LOWER EXTREMITY EXAM: RIGHT  INSPECTION & PALPATION: Area of redness, skin excoriation, slight warmth R lateral leg as below. No fluctuance. No drainage.    SENSORY: sensation is intact to light touch in:  Superficial peroneal nerve distribution (over dorsum of foot) Deep peroneal nerve distribution (over first dorsal web space) Sural nerve distribution (over lateral aspect 5th  metatarsal) Saphenous nerve distribution (over medial instep)  MOTOR:  + Motor EHL (great toe dorsiflexion) + FHL (great toe plantar flexion)  + TA (ankle dorsiflexion)  + GSC (ankle plantar flexion)  VASCULAR: 2+ dorsalis pedis and posterior tibialis pulses Capillary refill < 2 sec, toes warm and well-perfused  COMPARTMENTS: Soft, warm, well-perfused No pain with passive extension No parethesias   ____________________________________________   LABS (all labs ordered are listed, but only abnormal results are displayed)  Labs Reviewed  LACTIC ACID, PLASMA  COMPREHENSIVE METABOLIC PANEL  CBC WITH DIFFERENTIAL/PLATELET    ____________________________________________  EKG:  ________________________________________  RADIOLOGY All imaging,  including plain films, CT scans, and ultrasounds, independently reviewed by me, and interpretations confirmed via formal radiology reads.  ED MD interpretation:     Official radiology report(s): No results found.  ____________________________________________  PROCEDURES   Procedure(s) performed (including Critical Care):  Procedures  ____________________________________________  INITIAL IMPRESSION / MDM / ASSESSMENT AND PLAN / ED COURSE  As part of my medical decision making, I reviewed the following data within the electronic MEDICAL RECORD NUMBER Nursing notes reviewed and incorporated, Old chart reviewed, Notes from prior ED visits, and [place] Controlled Substance Database       *Paul Torpey was evaluated in Emergency Department on 09/27/2020 for the symptoms described in the history of present illness. She was evaluated in the context of the global COVID-19 pandemic, which necessitated consideration that the patient might be at risk for infection with the SARS-CoV-2 virus that causes COVID-19. Institutional protocols and algorithms that pertain to the evaluation of patients at risk for COVID-19 are in a state of rapid change  based on information released by regulatory bodies including the CDC and federal and state organizations. These policies and algorithms were followed during the patient's care in the ED.  Some ED evaluations and interventions may be delayed as a result of limited staffing during the pandemic.*     Medical Decision Making: 25 year old female here with recurrent leg swelling.  See image above.  Patient has had multiple recent ED visits and actually had significant improvement on clindamycin, but had an allergic reaction.  She was then switched to Augmentin.  Suspect she has MRSA or other resistant strep or staph infection given recurrence on Augmentin.  She otherwise does have some mild nausea but I suspect this is related to her Augmentin use.  She is afebrile and hemodynamically stable.  Lactic acid is normal.  No leukocytosis or left shift.  I discussed management options with the patient in detail.  Given absence of significant systemic illness, feel she is a reasonable candidate for dalbavancin.  Patient given dose here and tolerated it well.  Will discharge with outpatient ID follow-up and good return precautions. Otherwise, no signs of nec fasc or deep infection. No clinical signs of DVT.  ____________________________________________  FINAL CLINICAL IMPRESSION(S) / ED DIAGNOSES  Final diagnoses:  Cellulitis of right leg     MEDICATIONS GIVEN DURING THIS VISIT:  Medications  dalbavancin (DALVANCE) 1,500 mg in dextrose 5 % 500 mL IVPB (1,500 mg Intravenous New Bag/Given 09/27/20 1707)     ED Discharge Orders         Ordered    Ambulatory referral to Infectious Disease       Comments: Cellulitis patient:  Received dalbavancin on 09/27/2020.   09/27/20 1520           Note:  This document was prepared using Dragon voice recognition software and may include unintentional dictation errors.   Shaune Pollack, MD 09/27/20 Windy Fast

## 2020-09-27 NOTE — ED Triage Notes (Signed)
Pt states she is being treated for a staph infection on the back of her R leg on her calf- pt was placed on an antibiotic but states that the redness and swelling has gotten worse- pt states she now also has n/v/d

## 2020-10-04 ENCOUNTER — Other Ambulatory Visit
Admission: RE | Admit: 2020-10-04 | Discharge: 2020-10-04 | Disposition: A | Discharge status: Home / Self Care | Payer: Medicaid Other | Source: Ambulatory Visit | Attending: Infectious Diseases | Admitting: Infectious Diseases

## 2020-10-04 ENCOUNTER — Encounter: Payer: Self-pay | Admitting: Infectious Diseases

## 2020-10-04 ENCOUNTER — Other Ambulatory Visit: Payer: Self-pay

## 2020-10-04 ENCOUNTER — Ambulatory Visit: Payer: Medicaid Other | Attending: Infectious Diseases | Admitting: Infectious Diseases

## 2020-10-04 VITALS — BP 118/78 | HR 97 | Temp 97.7°F | Resp 16 | Ht 68.0 in | Wt 247.0 lb

## 2020-10-04 DIAGNOSIS — A4901 Methicillin susceptible Staphylococcus aureus infection, unspecified site: Secondary | ICD-10-CM | POA: Diagnosis not present

## 2020-10-04 DIAGNOSIS — L0390 Cellulitis, unspecified: Secondary | ICD-10-CM | POA: Insufficient documentation

## 2020-10-04 DIAGNOSIS — A4902 Methicillin resistant Staphylococcus aureus infection, unspecified site: Secondary | ICD-10-CM | POA: Insufficient documentation

## 2020-10-04 DIAGNOSIS — L03115 Cellulitis of right lower limb: Secondary | ICD-10-CM | POA: Diagnosis not present

## 2020-10-04 LAB — MRSA PCR SCREENING: MRSA by PCR: NEGATIVE

## 2020-10-04 NOTE — Progress Notes (Signed)
NAME: Heather Jacobs  DOB: 1995-06-13  MRN: [id number]  Date/Time: 10/04/2020 11:46 AM   Subjective:  Is referred by ED for rt leg cellulitis and wound ? Heather Jacobs is a 25 y.o. female sustained an injury to the rt calf when she was taking garbage out and the broken glass in the garbage bag cut her skin earache cleaned it with soap and water-   She went to [place] on 5/20 as it was getting worse   A culture was taken and she was started on keflex 500mg  PO BID- she was later told the culture was staph aureus. She presented to [facility] ED on 5/27 with worsenign erythema, oozing   She was prescribed clindamycin and took it for 2 days and developed generalized hives /rash and came back to the ED on 5/31 and was given amox/clav. She returned to the ED on 6/2 because of N/V/D whichwas thought to be due to augmentin   WBC was normal- She had no fever. She was treated with one dose of IV dlabavancin 1500mg   and asked to follow up with me. Today is a week since she was given IV dalba and she is doing well The swelling /redness much better It is itchy . She has 2 kids and is breast feeding one  Past Medical History:  Diagnosis Date   Ectopic pregnancy    Family history of ovarian cancer    3/22 cancer genetic testing letter sent   Ruptured right tubal ectopic pregnancy causing hemoperitoneum 07/18/2018    Past Surgical History:  Procedure Laterality Date   ARTHROSCOPIC REPAIR ACL     DILATION AND EVACUATION  06/27/2018   LAPAROSCOPY N/A 07/18/2018   Procedure: LAPAROSCOPY DIAGNOSTIC,right salpingostomy;  Surgeon: 08/27/2018, MD;  Location: [facility] ORS;  Service: Gynecology;  Laterality: N/A;  ectopic supected to be in blood loss    Social History   Socioeconomic History   Marital status: Married    Spouse name: 07/20/2018   Number of children: 1   Years of education: Not on file   Highest education level: Not on file  Occupational History   Not on file  Tobacco Use   Smoking  status: Never   Smokeless tobacco: Never  Vaping Use   Vaping Use: Never used  Substance and Sexual Activity   Alcohol use: Never   Drug use: Never   Sexual activity: Yes    Birth control/protection: I.U.D.  Other Topics Concern   Not on file  Social History Narrative   Not on file   Social Determinants of Health   Financial Resource Strain: Not on file  Food Insecurity: Not on file  Transportation Needs: Not on file  Physical Activity: Not on file  Stress: Not on file  Social Connections: Not on file  Intimate Partner Violence: Not on file    Family History  Problem Relation Age of Onset   Diabetes Mellitus I Father    Heart defect Father        Aortic stenosis (born with it)   Hypothyroidism Mother    Ovarian cancer Paternal Aunt 63   Allergies  Allergen Reactions   Chlorhexidine Hives and Other (See Comments)    States she's allergic   Clindamycin/Lincomycin Rash   I? Current Outpatient Medications  Medication Sig Dispense Refill   diphenhydrAMINE (BENADRYL ALLERGY) 25 mg capsule Take 1 capsule (25 mg total) by mouth every 6 (six) hours as needed. 30 capsule 0   levonorgestrel (MIRENA) 20 MCG/24HR IUD 1  each by Intrauterine route once.     sertraline (ZOLOFT) 50 MG tablet Take 1 tablet (50 mg total) by mouth daily. 30 tablet 9   No current facility-administered medications for this visit.     Abtx:  Anti-infectives (From admission, onward)    None       REVIEW OF SYSTEMS:  Const: negative fever, negative chills, negative weight loss Eyes: negative diplopia or visual changes, negative eye pain ENT: negative coryza, negative sore throat Resp: negative cough, hemoptysis, dyspnea Cards: negative for chest pain, palpitations, lower extremity edema GU: negative for frequency, dysuria and hematuria GI: Negative for abdominal pain, diarrhea, bleeding, constipation Heme: negative for easy bruising and gum/nose bleeding MS: negative for myalgias, arthralgias,  back pain and muscle weakness Neurolo:negative for headaches, dizziness, vertigo, memory problems  Psych: negative for feelings of anxiety, depression  Endocrine: negative for thyroid, diabetes Allergy/Immunology- clinda Objective:  VITALS:  BP 118/78   Pulse 97   Temp 97.7 F (36.5 C)   Resp 16   Ht 5\' 8"  (1.727 m)   Wt 247 lb (112 kg)   SpO2 98%   BMI 37.56 kg/m  PHYSICAL EXAM:  General: Alert, cooperative, no distress, appears stated age.  Head: Normocephalic, without obvious abnormality, atraumatic. Eyes: Conjunctivae clear, anicteric sclerae. Pupils are equal ENT Nares normal. No drainage or sinus tenderness. Lips, mucosa, and tongue normal. No Thrush Neck: Supple, symmetrical, no adenopathy, thyroid: non tender no carotid bruit and no JVD. Back: No CVA tenderness. Lungs: Clear to auscultation bilaterally. No Wheezing or Rhonchi. No rales. Heart: Regular rate and rhythm, no murmur, rub or gallop. Abdomen: Soft, non-tender,not distended. Bowel sounds normal. No masses Extremities: atraumatic, no cyanosis. No edema. No clubbing Skin: rt leg No erythema. Cut injury has healed   Lymph: Cervical, supraclavicular normal. Neurologic: Grossly non-focal Pertinent Labs Lab Results CBC    Component Value Date/Time   WBC 5.3 09/27/2020 1240   RBC 4.75 09/27/2020 1240   HGB 13.5 09/27/2020 1240   HGB 12.1 04/06/2019 1612   HCT 40.1 09/27/2020 1240   HCT 35.2 04/06/2019 1612   PLT 224 09/27/2020 1240   PLT 192 04/06/2019 1612   MCV 84.4 09/27/2020 1240   MCV 85 04/06/2019 1612   MCH 28.4 09/27/2020 1240   MCHC 33.7 09/27/2020 1240   RDW 12.5 09/27/2020 1240   RDW 11.8 04/06/2019 1612   LYMPHSABS 1.1 09/27/2020 1240   LYMPHSABS 0.9 04/06/2019 1612   MONOABS 0.4 09/27/2020 1240   EOSABS 0.2 09/27/2020 1240   EOSABS 0.0 04/06/2019 1612   BASOSABS 0.0 09/27/2020 1240   BASOSABS 0.0 04/06/2019 1612    CMP Latest Ref Rng & Units 09/27/2020 05/29/2019 05/03/2019  Glucose  70 - 99 mg/dL 97 84 07/01/2019)  BUN 6 - 20 mg/dL 12 8 8   Creatinine 0.44 - 1.00 mg/dL 510(C 5.85  Sodium 135 - 145 mmol/L 137 135 135  Potassium 3.5 - 5.1 mmol/L 4.0 3.5 3.8  Chloride 98 - 111 mmol/L 106 110 107  CO2 22 - 32 mmol/L 23 17(L) 20(L)  Calcium 8.9 - 10.3 mg/dL 9.3 2.77) 8.24)  Total Protein 6.5 - 8.1 g/dL 7.6 6.2(L) 6.4(L)  Total Bilirubin 0.3 - 1.2 mg/dL 0.5 0.6 0.4  Alkaline Phos 38 - 126 U/L 84 91 77  AST 15 - 41 U/L 22 20 22   ALT 0 - 44 U/L 21 14 20       Microbiology: 09/06/20 Wound culture MSSA Acinetobacter iowoffi  ? Impression/Recommendation ? ?rt  leg cellulitis and wound infection following a glass cut. MSSA and acinetobacter in the wound culture- she had taken keflex, clinda, augnemtin and IV dalbavancin The infection has resolved. She does not need any furtehr antibiotics  Before I got the culture result from the [facility] I had sent nares swab for MRSA as she was told that the infection was due to MRSA ( which was not the case as per the report- MSSA) Would have decolonized if MRSA nares was positive  ? ___________________________________________________ Discussed with patient Follow PRN Note:  This document was prepared using Dragon voice recognition software and may include unintentional dictation errors.

## 2020-10-04 NOTE — Patient Instructions (Signed)
You are here for rt leg cellulitis /wound which was reeated with IV Dalbavancin and you are doing well- you dont need any more antibiotics We will do a nares swab to check for MRSA colonization- if positive then we can treat with ointment for the nose and special body wash

## 2020-10-17 DIAGNOSIS — E039 Hypothyroidism, unspecified: Secondary | ICD-10-CM | POA: Insufficient documentation

## 2021-01-25 ENCOUNTER — Other Ambulatory Visit: Payer: Self-pay

## 2021-01-25 ENCOUNTER — Encounter: Payer: Self-pay | Admitting: Obstetrics & Gynecology

## 2021-01-25 ENCOUNTER — Ambulatory Visit (INDEPENDENT_AMBULATORY_CARE_PROVIDER_SITE_OTHER): Payer: 59 | Admitting: Obstetrics & Gynecology

## 2021-01-25 VITALS — BP 120/80 | Ht 68.5 in | Wt 283.0 lb

## 2021-01-25 DIAGNOSIS — Z3009 Encounter for other general counseling and advice on contraception: Secondary | ICD-10-CM | POA: Diagnosis not present

## 2021-01-25 NOTE — Patient Instructions (Signed)
Surgery to Prevent Pregnancy °Sterilization is surgery to prevent pregnancy. Sterilization is permanent. It should only be done if you are sure that you do not want to have children. °For females, the fallopian tubes are either blocked or closed off. When the fallopian tubes are closed, the eggs that the ovaries release cannot enter the uterus, sperm cannot reach the eggs, and pregnancy is prevented. °For males, the vas deferens is cut and then tied or burned (cauterized). The vas deferens is a tube that carries sperm from the testicles. This procedure prevents pregnancy by blocking sperm from going through the vas deferens and penis during ejaculation. °Types of sterilization °  °For females, the surgeries include: °Laparoscopic tubal ligation. In this surgery, the fallopian tubes are tied off, sealed with heat, or blocked with a clip, ring, or clamp. A small portion of each fallopian tube may also be removed. This surgery is done through several small cuts (incisions) with special instruments that are inserted into the abdomen. °Postpartum tubal ligation. This is also called a mini-laparotomy. This surgery is done right after childbirth or 1 or 2 days after childbirth. In this surgery, the fallopian tubes are tied off, sealed with heat, or blocked with a clip, ring, or clamp. A small portion of each fallopian tube may also be removed. The surgery is done through a single incision in the abdomen. °Tubal ligation during a C-section. In this surgery, the fallopian tubes are tied off, sealed with heat, or blocked with a clip, ring, or clamp. A small portion of each fallopian tube may also be removed. The surgery is done at the same time as a C-section delivery. °For males, the surgeries include: °Incision vasectomy. In this surgery, one or two small incisions are made in the scrotum. The vas deferens will be pulled out of the scrotum and cut. The vas deferens will be tied off or sealed with heat and placed back into  your scrotum. The incision will be closed with absorbable stitches (sutures). °No scalpel vasectomy. In this surgery, a punctured opening is made in the scrotum. The vas deferens will be pulled out of the scrotum and cut. The vas deferens will be tied off or sealed with heat and placed back into your scrotum. The opening is small and will not require sutures. °What are the benefits of sterilization? °It is usually effective for a lifetime. °The procedures are generally safe. °For females, sterilization does not affect the hormones, like other types of birth control. Because of this, menstrual periods will not be affected. °For both males and females, sexual desire and sexual performance will not be affected. °What are the disadvantages of sterilization? °Risks from the surgery °Generally, sterilization is safe. Complications are rare. However, there are some risks. They include: °Bleeding. °Infection. °Reaction to medicine used during the procedure. °Injury to surrounding organs. °Risks after sterilization °After a successful surgery, you may have other problems. Female sterilization risks may include: °Failure of the procedure. Sterilization is nearly 100% effective, but it can fail. In rare cases, the fallopian tubes can grow back together over time. If this happens, a female will be able to get pregnant again. °A higher risk of having an ectopic pregnancy. An ectopic pregnancy is a pregnancy that grows outside of the uterus. This kind of pregnancy can lead to serious bleeding if it is not treated. °Female sterilization risks may include: °Bleeding and swelling of the scrotum. °Failure of the procedure. There is a very small chance that the tied or cauterized   ends of the vas deferens may reconnect (recanalization). If this happens, a female could still make a female pregnant. °Other risks may include: °A risk that you may change your mind and decide you want have children. Sterilization may be reversed, but a reversal  is not always successful. °Lack of protection against sexually transmitted infections (STIs). °What happens during the procedure? °The steps of the procedure depend on the type of sterilization you are having. The procedure may vary among health care providers and hospitals. °Questions to ask your health care provider °How effective are sterilization procedures? °What type of procedure is right for me? °Is it possible to reverse the procedure if I change my mind? °What can I expect after the procedure? °Where to find more information °American College of Obstetricians and Gynecologists: www.acog.org/ °[place] Department of Health and Human Services: www.womenshealth.gov/ °[facility] [facility]: www.urologyhealth.org °Summary °Sterilization is surgery to prevent pregnancy. °There are different types of sterilization surgeries. °Sterilization may be reversed, but a reversal is not always successful. °Sterilization does not protect against STIs. °This information is not intended to replace advice given to you by your health care provider. Make sure you discuss any questions you have with your health care provider. °Document Revised: 01/14/2020 Document Reviewed: 01/14/2020 °Elsevier Patient Education © 2022 [employer] ° °

## 2021-01-25 NOTE — Progress Notes (Signed)
  Contraception Counseling Patient is a [id number] who presents for contraception counseling. The patient has no complaints today. The patient is sexually active. Pertinent past medical history: none.  She has a [employer] fast streamer and has no periods w Mirena.  SHe prefers permanent sterility option.  Prior Ectopic w surgery for that.  She has h/o mild periods.    PMHx: She  has a past medical history of Ectopic pregnancy, Family history of ovarian cancer, and Ruptured right tubal ectopic pregnancy causing hemoperitoneum (07/18/2018). Also,  has a past surgical history that includes Arthroscopic repair ACL; laparoscopy (N/A, 07/18/2018); and Dilation and evacuation (06/27/2018)., family history includes Diabetes Mellitus I in her father; Heart defect in her father; Hypothyroidism in her mother; Ovarian cancer (age of onset: 24) in her paternal aunt.,  reports that she has never smoked. She has never used smokeless tobacco. She reports that she does not drink alcohol and does not use drugs.  She has a current medication list which includes the following prescription(s): levonorgestrel, sertraline, diphenhydramine, and levothyroxine. Also, is allergic to chlorhexidine and clindamycin/lincomycin.  Review of Systems  Constitutional:  Negative for chills, fever and malaise/fatigue.  HENT:  Negative for congestion, sinus pain and sore throat.   Eyes:  Negative for blurred vision and pain.  Respiratory:  Negative for cough and wheezing.   Cardiovascular:  Negative for chest pain and leg swelling.  Gastrointestinal:  Negative for abdominal pain, constipation, diarrhea, heartburn, nausea and vomiting.  Genitourinary:  Negative for dysuria, frequency, hematuria and urgency.  Musculoskeletal:  Negative for back pain, joint pain, myalgias and neck pain.  Skin:  Negative for itching and rash.  Neurological:  Negative for dizziness, tremors and weakness.  Endo/Heme/Allergies:  Does not bruise/bleed easily.   Psychiatric/Behavioral:  Negative for depression. The patient is not nervous/anxious and does not have insomnia.    Objective: BP 120/80   Ht 5' 8.5" (1.74 m)   Wt 283 lb (128.4 kg)   BMI 42.40 kg/m  Physical Exam Constitutional:      General: She is not in acute distress.    Appearance: She is well-developed.  Musculoskeletal:        General: Normal range of motion.  Neurological:     Mental Status: She is alert and oriented to person, place, and time.  Skin:    General: Skin is warm and dry.  Vitals reviewed.    ASSESSMENT/PLAN:   Problem List Items Addressed This Visit     Sterilization consult    -  Primary     Birth Control I discussed multiple birth control options and methods with the patient.  The risks and benefits of each were reviewed.  The possible side effects including deep venous thrombosis, breast tenderness, fluid retention, mood changes and abnormal vaginal bleeding were discussed.  Combination as well as progesterone-only options, pros and cons counseled.  Surgery options discussed, as is their permanance.  She prefers this approach, with IUD removal at that time. Tubal papers signed today To schedule in Nov 2022   Annamarie Major, MD, [facility], [facility] 01/25/2021  10:48 AM

## 2021-01-29 ENCOUNTER — Telehealth: Payer: Self-pay

## 2021-01-29 ENCOUNTER — Other Ambulatory Visit: Payer: Self-pay | Admitting: Obstetrics & Gynecology

## 2021-01-29 NOTE — Telephone Encounter (Signed)
Left a message for the patient to return the call.  

## 2021-01-29 NOTE — Telephone Encounter (Signed)
Called patient to schedule Laparoscopy with Tubal Partial Salingectomy w Phylliss Blakes 11/10  H&P 11/2 @ 11:20   Pre-admit phone call appointment to be requested - date and time will be included on H&P paper work. Also all appointments will be updated on pt MyChart. Explained that this appointment has a call window. Based on the time scheduled will indicate if the call will be received within a 4 hour window before 1:00 or after.  Advised that pt may also receive calls from the hospital pharmacy and pre-service center.  Confirmed pt has [employer] as primary insurance. [employer] as Social research officer, government.   TUBAL CONSENT SIGNED 01/25/2021

## 2021-01-29 NOTE — Telephone Encounter (Signed)
-----   Message from Nadara Mustard, MD sent at 01/25/2021 10:46 AM EDT ----- Regarding: Surgery Surgery Booking Request Patient Full Name:  Heather Jacobs  MRN: [id number]  DOB: 12/25/1995  Surgeon: Letitia Libra, MD  Requested Surgery Date and Time: November 2022 Primary Diagnosis AND Code: Sterilization Secondary Diagnosis and Code: Z30.09 Surgical Procedure: Laparoscopy with Tubal Partial Salingectomy RNFA Requested?: No L&D Notification: No Admission Status: same day surgery Length of Surgery: 25 min Special Case Needs: No H&P: Yes Phone Interview???:  Yes Interpreter: No Medical Clearance:  No Special Scheduling Instructions: No Any known health/anesthesia issues, diabetes, sleep apnea, latex allergy, defibrillator/pacemaker?: No Acuity: P3   (P1 highest, P2 delay may cause harm, P3 low, elective gyn, P4 lowest) Post op follow up visits: yes  TUBAL CONSENT SIGNED 01/25/2021

## 2021-02-27 ENCOUNTER — Other Ambulatory Visit: Payer: Self-pay

## 2021-02-27 ENCOUNTER — Encounter: Payer: Self-pay | Admitting: Obstetrics & Gynecology

## 2021-02-27 ENCOUNTER — Ambulatory Visit (INDEPENDENT_AMBULATORY_CARE_PROVIDER_SITE_OTHER): Payer: 59 | Admitting: Obstetrics & Gynecology

## 2021-02-27 VITALS — BP 120/80 | Ht 68.5 in | Wt 288.0 lb

## 2021-02-27 DIAGNOSIS — Z3009 Encounter for other general counseling and advice on contraception: Secondary | ICD-10-CM

## 2021-02-27 NOTE — Progress Notes (Signed)
PRE-OPERATIVE HISTORY AND PHYSICAL EXAM  HPI:  Heather Jacobs is a 25 y.o. [id number] No LMP recorded. (Menstrual status: IUD).; she is being admitted for surgery related to requested sterilization.  Prefers permanent protection against future pregnancy.  Has been counseled as to all of the options.  PMHx: Past Medical History:  Diagnosis Date   Ectopic pregnancy    Family history of ovarian cancer    3/22 cancer genetic testing letter sent   Ruptured right tubal ectopic pregnancy causing hemoperitoneum 07/18/2018   Past Surgical History:  Procedure Laterality Date   ARTHROSCOPIC REPAIR ACL     DILATION AND EVACUATION  06/27/2018   LAPAROSCOPY N/A 07/18/2018   Procedure: LAPAROSCOPY DIAGNOSTIC,right salpingostomy;  Surgeon: Gae Dry, MD;  Location: [facility] ORS;  Service: Gynecology;  Laterality: N/A;  ectopic supected to be in blood loss   Family History  Problem Relation Age of Onset   Diabetes Mellitus I Father    Heart defect Father        Aortic stenosis (born with it)   Hypothyroidism Mother    Ovarian cancer Paternal Aunt 42   Social History   Tobacco Use   Smoking status: Never   Smokeless tobacco: Never  Vaping Use   Vaping Use: Never used  Substance Use Topics   Alcohol use: Never   Drug use: Never    Current Outpatient Medications:    levonorgestrel (MIRENA) 20 MCG/24HR IUD, 1 each by Intrauterine route once., Disp: , Rfl:    levothyroxine (SYNTHROID) 50 MCG tablet, Take 50 mcg by mouth daily before breakfast., Disp: , Rfl:    sertraline (ZOLOFT) 50 MG tablet, Take 1 tablet (50 mg total) by mouth daily., Disp: 30 tablet, Rfl: 9 Allergies: Chlorhexidine and Clindamycin/lincomycin  Review of Systems  Constitutional:  Negative for chills, fever and malaise/fatigue.  HENT:  Negative for congestion, sinus pain and sore throat.   Eyes:  Negative for blurred vision and pain.  Respiratory:  Negative for cough and wheezing.   Cardiovascular:  Negative for  chest pain and leg swelling.  Gastrointestinal:  Negative for abdominal pain, constipation, diarrhea, heartburn, nausea and vomiting.  Genitourinary:  Negative for dysuria, frequency, hematuria and urgency.  Musculoskeletal:  Negative for back pain, joint pain, myalgias and neck pain.  Skin:  Negative for itching and rash.  Neurological:  Negative for dizziness, tremors and weakness.  Endo/Heme/Allergies:  Does not bruise/bleed easily.  Psychiatric/Behavioral:  Negative for depression. The patient is not nervous/anxious and does not have insomnia.    Objective: BP 120/80   Ht 5' 8.5" (1.74 m)   Wt 288 lb (130.6 kg)   BMI 43.15 kg/m   Filed Weights   02/27/21 1114  Weight: 288 lb (130.6 kg)   Physical Exam Constitutional:      General: She is not in acute distress.    Appearance: She is well-developed.  HENT:     Head: Normocephalic and atraumatic. No laceration.     Right Ear: Hearing normal.     Left Ear: Hearing normal.     Mouth/Throat:     Pharynx: Uvula midline.  Eyes:     Pupils: Pupils are equal, round, and reactive to light.  Neck:     Thyroid: No thyromegaly.  Cardiovascular:     Rate and Rhythm: Normal rate and regular rhythm.     Heart sounds: No murmur heard.   No friction rub. No gallop.  Pulmonary:     Effort: Pulmonary effort is  normal. No respiratory distress.     Breath sounds: Normal breath sounds. No wheezing.  Abdominal:     General: Bowel sounds are normal. There is no distension.     Palpations: Abdomen is soft.     Tenderness: There is no abdominal tenderness. There is no rebound.  Musculoskeletal:        General: Normal range of motion.     Cervical back: Normal range of motion and neck supple.  Neurological:     Mental Status: She is alert and oriented to person, place, and time.     Cranial Nerves: No cranial nerve deficit.  Skin:    General: Skin is warm and dry.  Psychiatric:        Judgment: Judgment normal.  Vitals reviewed.    Assessment: 1. Sterilization consult   Plan laparoscopy w partial salpingectomy. Also, removal of IUD.  The patient has been fully informed about all methods of contraception, both temporary and permanent. She understands that tubal ligation is meant to be permanent, absolute and irreversible. She was told that there is an approximately 1 in 400 chance of a pregnancy in the future after tubal ligation. She was told the short and long term complications of tubal ligation. She understands the risks from this surgery include, but are not limited to, the risks of anesthesia, hemorrhage, infection, perforation, and injury to adjacent structures, bowel, bladder and blood vessels.   Annamarie Major, MD, [facility], [facility] 02/27/2021  11:23 AM

## 2021-02-27 NOTE — H&P (View-Only) (Signed)
PRE-OPERATIVE HISTORY AND PHYSICAL EXAM  HPI:  Heather Jacobs is a 25 y.o. [id number] No LMP recorded. (Menstrual status: IUD).; she is being admitted for surgery related to requested sterilization.  Prefers permanent protection against future pregnancy.  Has been counseled as to all of the options.  PMHx: Past Medical History:  Diagnosis Date   Ectopic pregnancy    Family history of ovarian cancer    3/22 cancer genetic testing letter sent   Ruptured right tubal ectopic pregnancy causing hemoperitoneum 07/18/2018   Past Surgical History:  Procedure Laterality Date   ARTHROSCOPIC REPAIR ACL     DILATION AND EVACUATION  06/27/2018   LAPAROSCOPY N/A 07/18/2018   Procedure: LAPAROSCOPY DIAGNOSTIC,right salpingostomy;  Surgeon: Gae Dry, MD;  Location: [facility];  Service: Gynecology;  Laterality: N/A;  ectopic supected to be in blood loss   Family History  Problem Relation Age of Onset   Diabetes Mellitus I Father    Heart defect Father        Aortic stenosis (born with it)   Hypothyroidism Mother    Ovarian cancer Paternal Aunt 61   Social History   Tobacco Use   Smoking status: Never   Smokeless tobacco: Never  Vaping Use   Vaping Use: Never used  Substance Use Topics   Alcohol use: Never   Drug use: Never    Current Outpatient Medications:    levonorgestrel (MIRENA) 20 MCG/24HR IUD, 1 each by Intrauterine route once., Disp: , Rfl:    levothyroxine (SYNTHROID) 50 MCG tablet, Take 50 mcg by mouth daily before breakfast., Disp: , Rfl:    sertraline (ZOLOFT) 50 MG tablet, Take 1 tablet (50 mg total) by mouth daily., Disp: 30 tablet, Rfl: 9 Allergies: Chlorhexidine and Clindamycin/lincomycin  Review of Systems  Constitutional:  Negative for chills, fever and malaise/fatigue.  HENT:  Negative for congestion, sinus pain and sore throat.   Eyes:  Negative for blurred vision and pain.  Respiratory:  Negative for cough and wheezing.   Cardiovascular:  Negative for  chest pain and leg swelling.  Gastrointestinal:  Negative for abdominal pain, constipation, diarrhea, heartburn, nausea and vomiting.  Genitourinary:  Negative for dysuria, frequency, hematuria and urgency.  Musculoskeletal:  Negative for back pain, joint pain, myalgias and neck pain.  Skin:  Negative for itching and rash.  Neurological:  Negative for dizziness, tremors and weakness.  Endo/Heme/Allergies:  Does not bruise/bleed easily.  Psychiatric/Behavioral:  Negative for depression. The patient is not nervous/anxious and does not have insomnia.    Objective: BP 120/80   Ht 5' 8.5" (1.74 m)   Wt 288 lb (130.6 kg)   BMI 43.15 kg/m   Filed Weights   02/27/21 1114  Weight: 288 lb (130.6 kg)   Physical Exam Constitutional:      General: She is not in acute distress.    Appearance: She is well-developed.  HENT:     Head: Normocephalic and atraumatic. No laceration.     Right Ear: Hearing normal.     Left Ear: Hearing normal.     Mouth/Throat:     Pharynx: Uvula midline.  Eyes:     Pupils: Pupils are equal, round, and reactive to light.  Neck:     Thyroid: No thyromegaly.  Cardiovascular:     Rate and Rhythm: Normal rate and regular rhythm.     Heart sounds: No murmur heard.   No friction rub. No gallop.  Pulmonary:     Effort: Pulmonary effort is  normal. No respiratory distress.     Breath sounds: Normal breath sounds. No wheezing.  Abdominal:     General: Bowel sounds are normal. There is no distension.     Palpations: Abdomen is soft.     Tenderness: There is no abdominal tenderness. There is no rebound.  Musculoskeletal:        General: Normal range of motion.     Cervical back: Normal range of motion and neck supple.  Neurological:     Mental Status: She is alert and oriented to person, place, and time.     Cranial Nerves: No cranial nerve deficit.  Skin:    General: Skin is warm and dry.  Psychiatric:        Judgment: Judgment normal.  Vitals reviewed.    Assessment: 1. Sterilization consult   Plan laparoscopy w partial salpingectomy. Also, removal of IUD.  The patient has been fully informed about all methods of contraception, both temporary and permanent. She understands that tubal ligation is meant to be permanent, absolute and irreversible. She was told that there is an approximately 1 in 400 chance of a pregnancy in the future after tubal ligation. She was told the short and long term complications of tubal ligation. She understands the risks from this surgery include, but are not limited to, the risks of anesthesia, hemorrhage, infection, perforation, and injury to adjacent structures, bowel, bladder and blood vessels.   Annamarie Major, MD, [facility], [facility] 02/27/2021  11:23 AM

## 2021-02-27 NOTE — Patient Instructions (Signed)
Laparoscopic Tubal Ligation, Care After °The following information offers guidance on how to care for yourself after your procedure. Your health care provider may also give you more specific instructions. If you have problems or questions, contact your health care provider. °What can I expect after the procedure? °After the procedure, it is common to have: °A sore throat if general anesthesia was used. °Pain in shoulders, back, and abdomen. This is caused by the gas that was used during the procedure. °Mild discomfort or cramping in your abdomen. °Pain or soreness in the area where the surgical incision was made. °A bloated feeling. °Tiredness. °Nausea and vomiting. °Follow these instructions at home: °Medicines °Take over-the-counter and prescription medicines only as told by your health care provider. °Ask your health care provider if the medicine prescribed to you: °Requires you to avoid driving or using heavy machinery. °Can cause constipation. You may need to take these actions to prevent or treat constipation: °Drink enough fluid to keep your urine pale yellow. °Take over-the-counter or prescription medicines. °Eat foods that are high in fiber, such as beans, whole grains, and fresh fruits and vegetables. °Limit foods that are high in fat and processed sugars, such as fried or sweet foods. °Do not take aspirin because it can cause bleeding. °Incision care °  °Follow instructions from your health care provider about how to take care of your incision. Make sure you: °Wash your hands with soap and water for at least 20 seconds before and after you change your bandage (dressing). If soap and water are not available, use hand sanitizer. °Change your dressing as told by your health care provider. °Leave stitches (sutures), skin glue, or adhesive strips in place. These skin closures may need to stay in place for 2 weeks or longer. If adhesive strip edges start to loosen and curl up, you may trim the loose edges. Do  not remove adhesive strips completely unless your health care provider tells you to do that. °Check your incision area every day for signs of infection. Check for: °Redness, swelling, or more pain. °Fluid or blood. °Warmth. °Pus or a bad smell. °Activity °Rest as told by your health care provider. °Avoid sitting for a long time without moving. Get up to take short walks every 1-2 hours. This is important to improve blood flow and breathing. Ask for help if you feel weak or unsteady. °Do not have sex, douche, or put a tampon or anything else in your vagina for 6 weeks or as long as told by your health care provider. °Do not lift anything that is heavier than your baby for 2 weeks, or the limit that you are told, until your health care provider says that it is safe. °Do not take baths, swim, or use a hot tub until your health care provider approves. Ask your health care provider if you may take showers. You may only be allowed to take sponge baths. °Return to your normal activities as told by your health care provider. Ask your health care provider what activities are safe for you. °General instructions °After the procedure you may need to wear a sanitary pad for vaginal discharge. °Have someone help you with your daily household tasks for the first few days. °Keep all follow-up visits. This is important. °Contact a health care provider if: °You have redness, swelling, or more pain around your incision. °Your incision feels warm to the touch. °You have pus or a bad smell coming from your incision. °The edges of your incision break   open after the sutures have been removed. °Your pain does not improve after 2-3 days. °You have a rash. °You repeatedly become dizzy or light-headed. °Your pain medicine is not helping. °Get help right away if: °You have a fever or chills. °You faint. °You have increasing pain in your abdomen. °You have severe pain in one or both of your shoulders. °You have fluid or blood coming from your  sutures or heavy bleeding from your vagina. °You have shortness of breath or difficulty breathing. °You have chest pain, leg pain, or leg swelling. °You have ongoing nausea, vomiting, or diarrhea. °These symptoms may represent a serious problem that is an emergency. Do not wait to see if the symptoms will go away. Get medical help right away. Call your local emergency services (911 in the [place] Do not drive yourself to the hospital. °Summary °After the procedure, it is common to have mild discomfort or cramping in your abdomen. °After the procedure you may need to wear a sanitary pad for vaginal discharge. °Take over-the-counter and prescription medicines only as told by your health care provider. °Watch for symptoms that should prompt you to call your health care provider. °Keep all follow-up visits. This is important. °This information is not intended to replace advice given to you by your health care provider. Make sure you discuss any questions you have with your health care provider. °Document Revised: 12/30/2019 Document Reviewed: 12/30/2019 °Elsevier Patient Education © 2022 [employer] Inc. ° °

## 2021-02-28 ENCOUNTER — Encounter
Admission: RE | Admit: 2021-02-28 | Discharge: 2021-02-28 | Disposition: A | Discharge status: Home / Self Care | Payer: Medicaid Other | Source: Ambulatory Visit | Attending: Obstetrics & Gynecology | Admitting: Obstetrics & Gynecology

## 2021-02-28 ENCOUNTER — Other Ambulatory Visit: Payer: Self-pay

## 2021-02-28 NOTE — Patient Instructions (Addendum)
Your procedure is scheduled on: Thursday 03/07/21 Report to the Registration Desk on the 1st floor of the [facility]. To find out your arrival time, please call [phone] between 1PM - 3PM on: Wednesday 03/06/21  REMEMBER: Instructions that are not followed completely may result in serious medical risk, up to and including death; or upon the discretion of your surgeon and anesthesiologist your surgery may need to be rescheduled.  Do not eat food or drink after midnight the night before surgery.  No gum chewing, lozengers or hard candies.   TAKE THESE MEDICATIONS THE MORNING OF SURGERY WITH A SIP OF WATER: levothyroxine (SYNTHROID) 50 MCG tablet   One week prior to surgery: Stop Anti-inflammatories (NSAIDS) such as Advil, Aleve, Ibuprofen, Motrin, Naproxen, Naprosyn and Aspirin based products such as Excedrin, Goodys Powder, BC Powder. Stop ANY OVER THE COUNTER supplements until after surgery. You may however, continue to take Tylenol if needed for pain up until the day of surgery.  No Alcohol for 24 hours before or after surgery.  No Smoking including e-cigarettes for 24 hours prior to surgery.  No chewable tobacco products for at least 6 hours prior to surgery.  No nicotine patches on the day of surgery.  Do not use any "recreational" drugs for at least a week prior to your surgery.  Please be advised that the combination of cocaine and anesthesia may have negative outcomes, up to and including death. If you test positive for cocaine, your surgery will be cancelled.  On the morning of surgery brush your teeth with toothpaste and water, you may rinse your mouth with mouthwash if you wish. Do not swallow any toothpaste or mouthwash.  Use CHG Soap as directed on instruction sheet.  Do not wear jewelry, make-up, hairpins, clips or nail polish.  Do not wear lotions, powders, or perfumes.   Do not shave body from the neck down 48 hours prior to surgery just in case you cut  yourself which could leave a site for infection.  Also, freshly shaved skin may become irritated if using the CHG soap.  Contact lenses, hearing aids and dentures may not be worn into surgery.  Do not bring valuables to the hospital. [facility] is not responsible for any missing/lost belongings or valuables.   Notify your doctor if there is any change in your medical condition (cold, fever, infection).  Wear comfortable clothing (specific to your surgery type) to the hospital.  After surgery, you can help prevent lung complications by doing breathing exercises.  Take deep breaths and cough every 1-2 hours.   If you are being discharged the day of surgery, you will not be allowed to drive home. You will need a responsible adult (18 years or older) to drive you home and stay with you that night.   If you are taking public transportation, you will need to have a responsible adult (18 years or older) with you. Please confirm with your physician that it is acceptable to use public transportation.   Please call the Pre-admissions Testing Dept. at [phone] if you have any questions about these instructions.  Surgery Visitation Policy:  Patients undergoing a surgery or procedure may have one family member or support person with them as long as that person is not COVID-19 positive or experiencing its symptoms.  That person may remain in the waiting area during the procedure and may rotate out with other people.  Inpatient Visitation:    Visiting hours are 7 a.m. to 8 p.m.  Up to two visitors ages 16+ are allowed at one time in a patient room. The visitors may rotate out with other people during the day. Visitors must check out when they leave, or other visitors will not be allowed. One designated support person may remain overnight. The visitor must pass COVID-19 screenings, use hand sanitizer when entering and exiting the patient's room and wear a mask at all times, including in the  patient's room. Patients must also wear a mask when staff or their visitor are in the room. Masking is required regardless of vaccination status.

## 2021-03-01 ENCOUNTER — Encounter
Admission: RE | Admit: 2021-03-01 | Discharge: 2021-03-01 | Disposition: A | Discharge status: Home / Self Care | Payer: Medicaid Other | Source: Ambulatory Visit | Attending: Obstetrics & Gynecology | Admitting: Obstetrics & Gynecology

## 2021-03-01 DIAGNOSIS — Z01812 Encounter for preprocedural laboratory examination: Secondary | ICD-10-CM | POA: Diagnosis present

## 2021-03-01 LAB — CBC
HCT: 40.9 % (ref 36.0–46.0)
Hemoglobin: 13.4 g/dL (ref 12.0–15.0)
MCH: 28.9 pg (ref 26.0–34.0)
MCHC: 32.8 g/dL (ref 30.0–36.0)
MCV: 88.3 fL (ref 80.0–100.0)
Platelets: 236 10*3/uL (ref 150–400)
RBC: 4.63 MIL/uL (ref 3.87–5.11)
RDW: 12.8 % (ref 11.5–15.5)
WBC: 6.9 10*3/uL (ref 4.0–10.5)
nRBC: 0 % (ref 0.0–0.2)

## 2021-03-01 LAB — TYPE AND SCREEN
ABO/RH(D): B POS
Antibody Screen: NEGATIVE

## 2021-03-07 ENCOUNTER — Encounter
Admission: RE | Disposition: A | Discharge status: Home / Self Care | Payer: Self-pay | Source: Home / Self Care | Attending: Obstetrics & Gynecology

## 2021-03-07 ENCOUNTER — Encounter: Payer: Self-pay | Admitting: Obstetrics & Gynecology

## 2021-03-07 ENCOUNTER — Ambulatory Visit: Payer: 59 | Admitting: Urgent Care

## 2021-03-07 ENCOUNTER — Ambulatory Visit
Admission: RE | Admit: 2021-03-07 | Discharge: 2021-03-07 | Disposition: A | Discharge status: Home / Self Care | Payer: 59 | Attending: Obstetrics & Gynecology | Admitting: Obstetrics & Gynecology

## 2021-03-07 ENCOUNTER — Ambulatory Visit: Payer: 59 | Admitting: Certified Registered"

## 2021-03-07 ENCOUNTER — Other Ambulatory Visit: Payer: Self-pay

## 2021-03-07 DIAGNOSIS — Z302 Encounter for sterilization: Secondary | ICD-10-CM | POA: Insufficient documentation

## 2021-03-07 DIAGNOSIS — Z30432 Encounter for removal of intrauterine contraceptive device: Secondary | ICD-10-CM

## 2021-03-07 HISTORY — PX: LAPAROSCOPIC BILATERAL SALPINGECTOMY: SHX5889

## 2021-03-07 LAB — POCT PREGNANCY, URINE: Preg Test, Ur: NEGATIVE

## 2021-03-07 SURGERY — SALPINGECTOMY, BILATERAL, LAPAROSCOPIC
Anesthesia: General | Laterality: Bilateral

## 2021-03-07 MED ORDER — FAMOTIDINE 20 MG PO TABS
ORAL_TABLET | ORAL | Status: AC
  Administered 2021-03-07: 20 mg via ORAL
  Filled 2021-03-07: qty 1

## 2021-03-07 MED ORDER — MIDAZOLAM HCL 2 MG/2ML IJ SOLN
INTRAMUSCULAR | Status: DC | PRN
Start: 2021-03-07 — End: 2021-03-07
  Administered 2021-03-07: 2 mg via INTRAVENOUS

## 2021-03-07 MED ORDER — OXYCODONE-ACETAMINOPHEN 5-325 MG PO TABS: 1.0000 | ORAL_TABLET | ORAL | Status: DC | PRN

## 2021-03-07 MED ORDER — DEXAMETHASONE SODIUM PHOSPHATE 10 MG/ML IJ SOLN
INTRAMUSCULAR | Status: DC | PRN
  Administered 2021-03-07: 10 mg via INTRAVENOUS

## 2021-03-07 MED ORDER — OXYCODONE-ACETAMINOPHEN 5-325 MG PO TABS: 1.0000 | ORAL_TABLET | ORAL | 0 refills | Status: DC | PRN

## 2021-03-07 MED ORDER — 0.9 % SODIUM CHLORIDE (POUR BTL) OPTIME
TOPICAL | Status: DC | PRN
  Administered 2021-03-07: 50 mL

## 2021-03-07 MED ORDER — ROCURONIUM BROMIDE 100 MG/10ML IV SOLN
INTRAVENOUS | Status: DC | PRN
  Administered 2021-03-07: 50 mg via INTRAVENOUS

## 2021-03-07 MED ORDER — LACTATED RINGERS IV SOLN: INTRAVENOUS | Status: DC

## 2021-03-07 MED ORDER — ONDANSETRON HCL 4 MG/2ML IJ SOLN: 4.0000 mg | Freq: Once | INTRAMUSCULAR | Status: DC | PRN

## 2021-03-07 MED ORDER — KETOROLAC TROMETHAMINE 30 MG/ML IJ SOLN
INTRAMUSCULAR | Status: DC | PRN
  Administered 2021-03-07: 30 mg via INTRAVENOUS

## 2021-03-07 MED ORDER — SUGAMMADEX SODIUM 200 MG/2ML IV SOLN
INTRAVENOUS | Status: DC | PRN
  Administered 2021-03-07: 200 mg via INTRAVENOUS

## 2021-03-07 MED ORDER — BUPIVACAINE HCL (PF) 0.5 % IJ SOLN
INTRAMUSCULAR | Status: AC
  Filled 2021-03-07: qty 30

## 2021-03-07 MED ORDER — LIDOCAINE HCL (CARDIAC) PF 100 MG/5ML IV SOSY
PREFILLED_SYRINGE | INTRAVENOUS | Status: DC | PRN
  Administered 2021-03-07: 100 mg via INTRAVENOUS

## 2021-03-07 MED ORDER — FENTANYL CITRATE (PF) 100 MCG/2ML IJ SOLN
INTRAMUSCULAR | Status: DC | PRN
  Administered 2021-03-07: 100 ug via INTRAVENOUS

## 2021-03-07 MED ORDER — FENTANYL CITRATE (PF) 100 MCG/2ML IJ SOLN: 25.0000 ug | INTRAMUSCULAR | Status: DC | PRN

## 2021-03-07 MED ORDER — ACETAMINOPHEN 10 MG/ML IV SOLN
INTRAVENOUS | Status: AC
  Filled 2021-03-07: qty 100

## 2021-03-07 MED ORDER — CHLORHEXIDINE GLUCONATE 0.12 % MT SOLN
OROMUCOSAL | Status: AC
  Filled 2021-03-07: qty 15

## 2021-03-07 MED ORDER — DEXMEDETOMIDINE (PRECEDEX) IN NS 20 MCG/5ML (4 MCG/ML) IV SYRINGE
PREFILLED_SYRINGE | INTRAVENOUS | Status: DC | PRN
  Administered 2021-03-07: 4 ug via INTRAVENOUS
  Administered 2021-03-07: 8 ug via INTRAVENOUS
  Administered 2021-03-07: 4 ug via INTRAVENOUS

## 2021-03-07 MED ORDER — ACETAMINOPHEN 10 MG/ML IV SOLN
INTRAVENOUS | Status: DC | PRN
  Administered 2021-03-07: 1000 mg via INTRAVENOUS

## 2021-03-07 MED ORDER — FAMOTIDINE 20 MG PO TABS: 20.0000 mg | ORAL_TABLET | Freq: Once | ORAL | Status: AC

## 2021-03-07 MED ORDER — BUPIVACAINE HCL (PF) 0.5 % IJ SOLN
INTRAMUSCULAR | Status: DC | PRN
  Administered 2021-03-07: 14 mL

## 2021-03-07 MED ORDER — MORPHINE SULFATE (PF) 2 MG/ML IV SOLN: 1.0000 mg | INTRAVENOUS | Status: DC | PRN

## 2021-03-07 MED ORDER — ACETAMINOPHEN 650 MG RE SUPP
650.0000 mg | RECTAL | Status: DC | PRN
  Filled 2021-03-07: qty 1

## 2021-03-07 MED ORDER — POVIDONE-IODINE 10 % EX SWAB: 2.0000 "application " | Freq: Once | CUTANEOUS | Status: DC

## 2021-03-07 MED ORDER — FENTANYL CITRATE (PF) 100 MCG/2ML IJ SOLN
INTRAMUSCULAR | Status: AC
  Filled 2021-03-07: qty 2

## 2021-03-07 MED ORDER — ONDANSETRON HCL 4 MG/2ML IJ SOLN
INTRAMUSCULAR | Status: DC | PRN
  Administered 2021-03-07: 4 mg via INTRAVENOUS

## 2021-03-07 MED ORDER — MIDAZOLAM HCL 2 MG/2ML IJ SOLN
INTRAMUSCULAR | Status: AC
  Filled 2021-03-07: qty 2

## 2021-03-07 MED ORDER — PROPOFOL 10 MG/ML IV BOLUS
INTRAVENOUS | Status: DC | PRN
  Administered 2021-03-07: 170 mg via INTRAVENOUS

## 2021-03-07 MED ORDER — ACETAMINOPHEN 325 MG PO TABS: 650.0000 mg | ORAL_TABLET | ORAL | Status: DC | PRN

## 2021-03-07 MED ORDER — ORAL CARE MOUTH RINSE: 15.0000 mL | Freq: Once | OROMUCOSAL | Status: DC

## 2021-03-07 SURGICAL SUPPLY — 49 items
ADH SKN CLS APL DERMABOND .7 (GAUZE/BANDAGES/DRESSINGS) ×1
APL PRP STRL LF DISP 70% ISPRP (MISCELLANEOUS)
BAG SPEC RTRVL LRG 6X4 10 (ENDOMECHANICALS)
BLADE SURG SZ11 CARB STEEL (BLADE) ×2 IMPLANT
CATH ROBINSON RED A/P 16FR (CATHETERS) ×2 IMPLANT
CHLORAPREP W/TINT 26 (MISCELLANEOUS) IMPLANT
DERMABOND ADVANCED (GAUZE/BANDAGES/DRESSINGS) ×1
DERMABOND ADVANCED .7 DNX12 (GAUZE/BANDAGES/DRESSINGS) ×1 IMPLANT
DRSG TEGADERM 2-3/8X2-3/4 SM (GAUZE/BANDAGES/DRESSINGS) ×6 IMPLANT
DRSG TELFA 4X3 1S NADH ST (GAUZE/BANDAGES/DRESSINGS) IMPLANT
GAUZE 4X4 16PLY ~~LOC~~+RFID DBL (SPONGE) ×2 IMPLANT
GAUZE SPONGE 4X4 12PLY STRL (GAUZE/BANDAGES/DRESSINGS) ×2 IMPLANT
GLOVE SURG ENC MOIS LTX SZ8 (GLOVE) ×4 IMPLANT
GLOVE SURG UNDER LTX SZ8 (GLOVE) ×2 IMPLANT
GOWN STRL REUS W/ TWL LRG LVL3 (GOWN DISPOSABLE) ×1 IMPLANT
GOWN STRL REUS W/ TWL XL LVL3 (GOWN DISPOSABLE) ×1 IMPLANT
GOWN STRL REUS W/TWL LRG LVL3 (GOWN DISPOSABLE) ×2
GOWN STRL REUS W/TWL XL LVL3 (GOWN DISPOSABLE) ×2
GRASPER SUT TROCAR 14GX15 (MISCELLANEOUS) ×2 IMPLANT
IRRIGATION STRYKERFLOW (MISCELLANEOUS) IMPLANT
IRRIGATOR STRYKERFLOW (MISCELLANEOUS)
IV LACTATED RINGERS 1000ML (IV SOLUTION) IMPLANT
KIT PINK PAD W/HEAD ARE REST (MISCELLANEOUS) ×2
KIT PINK PAD W/HEAD ARM REST (MISCELLANEOUS) ×1 IMPLANT
LABEL OR SOLS (LABEL) ×2 IMPLANT
MANIFOLD NEPTUNE II (INSTRUMENTS) ×2 IMPLANT
NEEDLE VERESS 14GA 120MM (NEEDLE) ×2 IMPLANT
NS IRRIG 500ML POUR BTL (IV SOLUTION) ×2 IMPLANT
PACK GYN LAPAROSCOPIC (MISCELLANEOUS) ×2 IMPLANT
PAD PREP 24X41 OB/GYN DISP (PERSONAL CARE ITEMS) ×2 IMPLANT
POUCH SPECIMEN RETRIEVAL 10MM (ENDOMECHANICALS) IMPLANT
SCISSORS METZENBAUM CVD 33 (INSTRUMENTS) IMPLANT
SCRUB EXIDINE 4% CHG 4OZ (MISCELLANEOUS) IMPLANT
SET TUBE SMOKE EVAC HIGH FLOW (TUBING) ×2 IMPLANT
SHEARS HARMONIC ACE PLUS 36CM (ENDOMECHANICALS) ×2 IMPLANT
SLEEVE ENDOPATH XCEL 5M (ENDOMECHANICALS) IMPLANT
SOL PREP PROV IODINE SCRUB 4OZ (MISCELLANEOUS) IMPLANT
SOL SCRUB PVP POV-IOD 4OZ 7.5% (MISCELLANEOUS) ×4
SOLUTION SCRB POV-IOD 4OZ 7.5% (MISCELLANEOUS) ×2 IMPLANT
SPONGE GAUZE 2X2 8PLY STRL LF (GAUZE/BANDAGES/DRESSINGS) ×4 IMPLANT
STRAP SAFETY 5IN WIDE (MISCELLANEOUS) ×2 IMPLANT
SUT VIC AB 0 CT1 36 (SUTURE) ×2 IMPLANT
SUT VIC AB 2-0 UR6 27 (SUTURE) IMPLANT
SUT VIC AB 4-0 PS2 18 (SUTURE) ×2 IMPLANT
SYR 10ML LL (SYRINGE) ×2 IMPLANT
SYSTEM WECK SHIELD CLOSURE (TROCAR) IMPLANT
TROCAR ENDO BLADELESS 11MM (ENDOMECHANICALS) IMPLANT
TROCAR XCEL NON-BLD 5MMX100MML (ENDOMECHANICALS) ×2 IMPLANT
WATER STERILE IRR 500ML POUR (IV SOLUTION) ×2 IMPLANT

## 2021-03-07 NOTE — Anesthesia Preprocedure Evaluation (Signed)
Anesthesia Evaluation  Patient identified by MRN, date of birth, ID band Patient awake    Reviewed: Allergy & Precautions, H&P , NPO status , Patient's Chart, lab work & pertinent test results, reviewed documented beta blocker date and time   Airway Mallampati: III  TM Distance: >3 FB Neck ROM: full    Dental  (+) Teeth Intact   Pulmonary neg pulmonary ROS,    Pulmonary exam normal        Cardiovascular negative cardio ROS Normal cardiovascular exam Rhythm:regular Rate:Normal     Neuro/Psych PSYCHIATRIC DISORDERS Depression negative neurological ROS     GI/Hepatic negative GI ROS, Neg liver ROS,   Endo/Other  Morbid obesity  Renal/GU negative Renal ROS  negative genitourinary   Musculoskeletal   Abdominal   Peds  Hematology negative hematology ROS (+)   Anesthesia Other Findings Past Medical History: No date: Ectopic pregnancy No date: Family history of ovarian cancer     Comment:  3/22 cancer genetic testing letter sent 07/18/2018: Ruptured right tubal ectopic pregnancy causing  hemoperitoneum Past Surgical History: No date: ARTHROSCOPIC REPAIR ACL 06/27/2018: DILATION AND EVACUATION 07/18/2018: LAPAROSCOPY; N/A     Comment:  Procedure: LAPAROSCOPY DIAGNOSTIC,right salpingostomy;                Surgeon: Nadara Mustard, MD;  Location: [facility] ORS;                Service: Gynecology;  Laterality: N/A;  ectopic supected               to be in blood loss   Reproductive/Obstetrics negative OB ROS                             Anesthesia Physical Anesthesia Plan  ASA: 3  Anesthesia Plan: General ETT   Post-op Pain Management:    Induction:   PONV Risk Score and Plan:   Airway Management Planned:   Additional Equipment:   Intra-op Plan:   Post-operative Plan:   Informed Consent: I have reviewed the patients History and Physical, chart, labs and discussed the procedure  including the risks, benefits and alternatives for the proposed anesthesia with the patient or authorized representative who has indicated his/her understanding and acceptance.     Dental Advisory Given  Plan Discussed with: CRNA  Anesthesia Plan Comments:         Anesthesia Quick Evaluation

## 2021-03-07 NOTE — Interval H&P Note (Signed)
History and Physical Interval Note:  03/07/2021 12:12 PM  Heather Jacobs  has presented today for surgery, with the diagnosis of Sterilization.  The various methods of treatment have been discussed with the patient and family. After consideration of risks, benefits and other options for treatment, the patient has consented to  Procedure(s): LAPAROSCOPIC BILATERAL SALPINGECTOMY (Bilateral) and REMOVAL of IUD as a surgical intervention.  The patient's history has been reviewed, patient examined, no change in status, stable for surgery.  I have reviewed the patient's chart and labs.  Questions were answered to the patient's satisfaction.     Letitia Libra

## 2021-03-07 NOTE — Transfer of Care (Signed)
Immediate Anesthesia Transfer of Care Note  Patient: Heather Jacobs  Procedure(s) Performed: LAPAROSCOPIC BILATERAL SALPINGECTOMY (Bilateral)  Patient Location: PACU  Anesthesia Type:General  Level of Consciousness: awake and drowsy  Airway & Oxygen Therapy: Patient Spontanous Breathing  Post-op Assessment: Report given to RN and Post -op Vital signs reviewed and stable  Post vital signs: Reviewed and stable  Last Vitals:  Vitals Value Taken Time  BP 120/75 03/07/21 1356  Temp    Pulse 63 03/07/21 1359  Resp 14 03/07/21 1359  SpO2 100 % 03/07/21 1359  Vitals shown include unvalidated device data.  Last Pain:  Vitals:   03/07/21 1142  TempSrc: Temporal  PainSc: 0-No pain         Complications: No notable events documented.

## 2021-03-07 NOTE — Op Note (Signed)
  Operative Note   03/07/2021  PRE-OP DIAGNOSIS: Desire for permanent sterilization  POST-OP DIAGNOSIS: same   PROCEDURE: Procedure(s): LAPAROSCOPIC BILATERAL SALPINGECTOMY  REMOVAL of IUD  SURGEON: Annamarie Major, MD, FACOG  ANESTHESIA: Choice   ESTIMATED BLOOD LOSS: Min  COMPLICATIONS: None  DISPOSITION: PACU - hemodynamically stable.  CONDITION: stable  FINDINGS: Laparoscopic survey of the abdomen revealed a grossly normal uterus, tubes, ovaries, liver edge, gallbladder edge and appendix, No intra-abdominal adhesions were noted.  PROCEDURE IN DETAIL: The patient was taken to the OR where anesthesia was administed. The patient was positioned in dorsal lithotomy in the [place] stirrups. The patient was then examined under anesthesia with the above noted findings. The patient was prepped and draped in the normal sterile fashion and bladder was drained using a red rubber cathater. Speculum exam normal, and a sponge stick was placed for manipulation purposes.  The Mirena IUD is grasped and removed.  Attention was turned to the patient's abdomen where a 5 mm skin incision was made in the umbilical fold, after injection of local anesthesia. The Veress step needle was carefully introduced into the peritoneal cavity with placement confirmed using the hanging drop technique.  Pneumoperitoneum was obtained. The 5 mm port was then placed under direct visualization with the operative laparoscope  The above noted findings.  Trendelenburg.  A 5 mm trocar was then placed in the right lower quadrant and also suprapubic region under direct visualization with the laparoscope.  Right and left fallopian tubes are identified and followed out to their fimbria.  Each tube is excised utilizing the Harmonic scapel to include the fibria.  No injuries or bleeding was noted.  All instruments and ports were then removed from the abdomen after gas was expelled and patient was leveled.   The skin was closed with 4-0  Vicryl suture and then skin adhesive. The patient tolerated the procedure well. All counts were correct x 2. The patient was transferred to the recovery room awake, alert and breathing independently.  Annamarie Major, MD, [facility], [facility] 03/07/2021  1:47 PM

## 2021-03-07 NOTE — Anesthesia Procedure Notes (Signed)
Procedure Name: Intubation Date/Time: 03/07/2021 12:50 PM Performed by: Cheral Bay, CRNA Pre-anesthesia Checklist: Patient identified, Emergency Drugs available, Suction available and Patient being monitored Patient Re-evaluated:Patient Re-evaluated prior to induction Oxygen Delivery Method: Circle system utilized Preoxygenation: Pre-oxygenation with 100% oxygen Induction Type: IV induction Ventilation: Mask ventilation without difficulty Laryngoscope Size: McGraph and 3 Grade View: Grade I Tube type: Oral Tube size: 7.0 mm Number of attempts: 1 Airway Equipment and Method: Stylet Placement Confirmation: ETT inserted through vocal cords under direct vision, positive ETCO2 and breath sounds checked- equal and bilateral Secured at: 22 cm Tube secured with: Tape Dental Injury: Teeth and Oropharynx as per pre-operative assessment

## 2021-03-07 NOTE — Discharge Instructions (Signed)

## 2021-03-10 ENCOUNTER — Encounter: Payer: Self-pay | Admitting: Obstetrics & Gynecology

## 2021-03-11 LAB — SURGICAL PATHOLOGY

## 2021-03-11 NOTE — Telephone Encounter (Signed)
Do you want her to come in?

## 2021-03-11 NOTE — Telephone Encounter (Signed)
Check and see her level of concern.  SOme redness in healing expected but if this is a lot or of concern then I can take a look

## 2021-03-12 NOTE — Anesthesia Postprocedure Evaluation (Signed)
Anesthesia Post Note  Patient: [name], academic  Procedure(s) Performed: LAPAROSCOPIC BILATERAL SALPINGECTOMY (Bilateral)  Patient location during evaluation: PACU Anesthesia Type: General Level of consciousness: awake and alert Pain management: pain level controlled Vital Signs Assessment: post-procedure vital signs reviewed and stable Respiratory status: spontaneous breathing, nonlabored ventilation, respiratory function stable and patient connected to nasal cannula oxygen Cardiovascular status: blood pressure returned to baseline and stable Postop Assessment: no apparent nausea or vomiting Anesthetic complications: no   No notable events documented.   Last Vitals:  Vitals:   03/07/21 1445 03/07/21 1522  BP: 115/73 121/73  Pulse: (!) 55 67  Resp: 16 16  Temp: (!) 36.1 C   SpO2: 99% 100%    Last Pain:  Vitals:   03/08/21 0937  TempSrc:   PainSc: 6                  Yevette Edwards

## 2021-03-18 ENCOUNTER — Other Ambulatory Visit: Payer: Self-pay

## 2021-03-18 ENCOUNTER — Encounter: Payer: Self-pay | Admitting: Obstetrics & Gynecology

## 2021-03-18 ENCOUNTER — Ambulatory Visit (INDEPENDENT_AMBULATORY_CARE_PROVIDER_SITE_OTHER): Payer: 59 | Admitting: Obstetrics & Gynecology

## 2021-03-18 VITALS — BP 128/80 | Ht 68.0 in | Wt 288.0 lb

## 2021-03-18 DIAGNOSIS — Z9851 Tubal ligation status: Secondary | ICD-10-CM

## 2021-03-18 NOTE — Progress Notes (Signed)
  Postoperative Follow-up Patient presents post op from laparoscopy and BTL  for requested sterilization, 2 weeks ago.  Subjective: Patient reports marked improvement in her preop symptoms. Eating a regular diet without difficulty. The patient is not having any pain.  Activity: normal activities of daily living. Patient reports additional symptom's since surgery of None.  Objective: BP 128/80   Ht 5\' 8"  (1.727 m)   Wt 288 lb (130.6 kg)   BMI 43.79 kg/m  Physical Exam Constitutional:      General: She is not in acute distress.    Appearance: She is well-developed.  Cardiovascular:     Rate and Rhythm: Normal rate.  Pulmonary:     Effort: Pulmonary effort is normal.  Abdominal:     General: There is no distension.     Palpations: Abdomen is soft.     Tenderness: There is no abdominal tenderness.     Comments: Incision Healing Well   Musculoskeletal:        General: Normal range of motion.  Neurological:     Mental Status: She is alert and oriented to person, place, and time.     Cranial Nerves: No cranial nerve deficit.  Skin:    General: Skin is warm and dry.    Assessment: s/p :  laparoscopy and tubal ligation progressing well  Plan: Patient has done well after surgery with no apparent complications.  I have discussed the post-operative course to date, and the expected progress moving forward.  The patient understands what complications to be concerned about.  I will see the patient in routine follow up, or sooner if needed.    Activity plan: No restriction. PAP due 06/2021  07/2021 03/18/2021, 4:57 PM

## 2021-04-02 NOTE — Telephone Encounter (Signed)
Mirena rcvd/charged 07/07/2019

## 2021-04-09 ENCOUNTER — Encounter: Payer: Self-pay | Admitting: Obstetrics & Gynecology

## 2021-04-30 ENCOUNTER — Other Ambulatory Visit: Payer: Self-pay | Admitting: Obstetrics & Gynecology

## 2021-04-30 DIAGNOSIS — F53 Postpartum depression: Secondary | ICD-10-CM

## 2021-07-29 ENCOUNTER — Other Ambulatory Visit: Payer: Self-pay | Admitting: Obstetrics & Gynecology

## 2021-07-29 DIAGNOSIS — F53 Postpartum depression: Secondary | ICD-10-CM

## 2021-07-29 NOTE — Telephone Encounter (Signed)
Scg Annual w a provider.  Refill x1 done.

## 2021-12-09 ENCOUNTER — Ambulatory Visit (INDEPENDENT_AMBULATORY_CARE_PROVIDER_SITE_OTHER): Payer: Medicaid Other | Admitting: Neurology

## 2021-12-09 ENCOUNTER — Encounter: Payer: Self-pay | Admitting: Neurology

## 2021-12-09 VITALS — BP 131/80 | HR 72 | Ht 68.0 in | Wt 293.5 lb

## 2021-12-09 DIAGNOSIS — R202 Paresthesia of skin: Secondary | ICD-10-CM

## 2021-12-09 DIAGNOSIS — G43709 Chronic migraine without aura, not intractable, without status migrainosus: Secondary | ICD-10-CM | POA: Insufficient documentation

## 2021-12-09 MED ORDER — RIZATRIPTAN BENZOATE 10 MG PO TBDP: 10.0000 mg | ORAL_TABLET | ORAL | 11 refills | Status: DC | PRN

## 2021-12-09 MED ORDER — TOPIRAMATE 100 MG PO TABS: 100.0000 mg | ORAL_TABLET | Freq: Two times a day (BID) | ORAL | 11 refills | Status: DC

## 2021-12-09 NOTE — Progress Notes (Signed)
Chief Complaint  Patient presents with   New Patient (Initial Visit)    Rm 14. Alone. NP/Paper/[facility]/numbness/tingling bilateral hands and feet, ncv/emg eval?      ASSESSMENT AND PLAN  Heather Jacobs is a 26 y.o. female   Female right wrist fracture, intermittent bilateral hands and feet paresthesia,  Right wrist Tinel signs, decreased pin prick at Right finger pads, most consistent with right carpal tunnel syndromes,  Suggested right wrist splint,  Chronic migraine headaches, obesity  She has frequent visual auras, starting Topamax up to 100 mg 2 tablets daily as preventive medications,  Maxalt as needed  Return to clinic with nurse practitioner in 6 months   DIAGNOSTIC DATA (LABS, IMAGING, TESTING) - I reviewed patient records, labs, notes, testing and imaging myself where available.   MEDICAL HISTORY:  Heather Jacobs, seen in request by   Heather Helper, NP [address] [place],  [place] [address], Heather Helper, NP   I reviewed and summarized the referring note. PMHX PTSD Hypothyroidism   She has random tinging in hands and feet, intermittent, walkign up, hard to walk, moves it goes away, in her toes, feets, fingers, varies, once a weeks, it will last for 2 minutes, moves make it better.  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  HA normal 2-3 a week, behinds right eye, lyding down, closer her eye, stay in headace, last for 3-5 hours,   Nause,   She has nto tried medication, breast feeding.  She gained 60 LB, stay with her.  Epidural puncute,   See eye doctor, glasses doctor, saw 6 months was fine.      PHYSICAL EXAM:   Vitals:   12/09/21 1055  BP: 131/80  Pulse: 72  Weight: 293 lb 8 oz (133.1 kg)  Height: 5\' 8"  (1.727 m)   Not recorded     Body mass index is 44.63 kg/m.  PHYSICAL EXAMNIATION:  Gen: NAD, conversant, well nourised, well groomed                      Cardiovascular: Regular rate rhythm, no peripheral edema, warm, nontender. Eyes: Conjunctivae clear without exudates or hemorrhage Neck: Supple, no carotid bruits. Pulmonary: Clear to auscultation bilaterally   NEUROLOGICAL EXAM:  MENTAL STATUS: Speech/cognition: Awake, alert, oriented to history taking and casual conversation CRANIAL NERVES: CN II: Visual fields are full to confrontation. Pupils are round equal and briskly reactive to light. CN III, IV, VI: extraocular movement are normal. No ptosis. CN V: Facial sensation is intact to light touch CN VII: Face is symmetric with normal eye closure  CN VIII: Hearing is normal to causal conversation. CN IX, X: Phonation is normal. CN XI: Head turning and shoulder shrug are intact  MOTOR: There is no pronator drift of out-stretched arms. Muscle bulk and tone are normal. Muscle strength is normal.  REFLEXES: Reflexes are 2+ and symmetric at the biceps, triceps, knees, and ankles. Plantar responses are flexor.  SENSORY: Intact to light touch, pinprick and vibratory sensation are intact in fingers and toes.  COORDINATION: There is no trunk or limb dysmetria noted.  GAIT/STANCE: Posture is normal. Gait is steady with normal steps, base, arm swing, and turning. Heel and toe walking are normal. Tandem gait is normal.  Romberg is absent.  REVIEW OF SYSTEMS:  Full 14 system review of systems performed and notable only for as above All other review  of systems were negative.   ALLERGIES: Allergies  Allergen Reactions   Chlorhexidine Hives and Other (See Comments)    States she's allergic   Clindamycin/Lincomycin Rash    HOME MEDICATIONS: Current Outpatient Medications  Medication Sig Dispense Refill   levothyroxine (SYNTHROID) 50 MCG tablet Take 50 mcg by mouth daily before breakfast.     sertraline (ZOLOFT) 50 MG tablet TAKE 1 TABLET(50 MG) BY MOUTH DAILY 90 tablet 0   No current facility-administered medications for this  visit.    PAST MEDICAL HISTORY: Past Medical History:  Diagnosis Date   Ectopic pregnancy    Family history of ovarian cancer    3/22 cancer genetic testing letter sent   Ruptured right tubal ectopic pregnancy causing hemoperitoneum 07/18/2018    PAST SURGICAL HISTORY: Past Surgical History:  Procedure Laterality Date   ARTHROSCOPIC REPAIR ACL     DILATION AND EVACUATION  06/27/2018   LAPAROSCOPIC BILATERAL SALPINGECTOMY Bilateral 03/07/2021   Procedure: LAPAROSCOPIC BILATERAL SALPINGECTOMY;  Surgeon: Heather Mustard, MD;  Location: [facility] ORS;  Service: Gynecology;  Laterality: Bilateral;   LAPAROSCOPY N/A 07/18/2018   Procedure: LAPAROSCOPY DIAGNOSTIC,right salpingostomy;  Surgeon: Heather Mustard, MD;  Location: [facility] ORS;  Service: Gynecology;  Laterality: N/A;  ectopic supected to be in blood loss    FAMILY HISTORY: Family History  Problem Relation Age of Onset   Diabetes Mellitus I Father    Heart defect Father        Aortic stenosis (born with it)   Hypothyroidism Mother    Ovarian cancer Paternal Aunt 26    SOCIAL HISTORY: Social History   Socioeconomic History   Marital status: Married    Spouse name: Nature conservation officer   Number of children: 2   Years of education: Not on file   Highest education level: Not on file  Occupational History   Not on file  Tobacco Use   Smoking status: Never   Smokeless tobacco: Never  Vaping Use   Vaping Use: Never used  Substance and Sexual Activity   Alcohol use: Never   Drug use: Never   Sexual activity: Yes    Birth control/protection: I.U.D.  Other Topics Concern   Not on file  Social History Narrative   Not on file   Social Determinants of Health   Financial Resource Strain: Not on file  Food Insecurity: Not on file  Transportation Needs: Not on file  Physical Activity: Not on file  Stress: Not on file  Social Connections: Not on file  Intimate Partner Violence: Not on file      Heather Jacobs, M.D. Ph.D.  [facility] [address], [address] [place], [place] [address] Ph: [phone] Fax: [phone]  CC:  Heather Helper, NP [address] [place],  [place] [address]  Heather Helper, NP

## 2022-03-06 ENCOUNTER — Emergency Department
Admission: EM | Admit: 2022-03-06 | Discharge: 2022-03-06 | Disposition: A | Discharge status: Home / Self Care | Payer: Medicaid Other | Attending: Emergency Medicine | Admitting: Emergency Medicine

## 2022-03-06 ENCOUNTER — Other Ambulatory Visit: Payer: Self-pay

## 2022-03-06 ENCOUNTER — Emergency Department: Payer: Medicaid Other

## 2022-03-06 DIAGNOSIS — S0990XA Unspecified injury of head, initial encounter: Secondary | ICD-10-CM | POA: Insufficient documentation

## 2022-03-06 DIAGNOSIS — W108XXA Fall (on) (from) other stairs and steps, initial encounter: Secondary | ICD-10-CM | POA: Insufficient documentation

## 2022-03-06 DIAGNOSIS — W19XXXA Unspecified fall, initial encounter: Secondary | ICD-10-CM

## 2022-03-06 NOTE — Discharge Instructions (Signed)
Your CT was negative.  Please return for any new, worsening, or change in symptoms or other concerns.

## 2022-03-06 NOTE — ED Provider Notes (Signed)
[facility] Provider Note    Event Date/Time   First MD Initiated Contact with Patient 03/06/22 1316     (approximate)   History   Fall   HPI  Heather Jacobs is a 26 y.o. female who presents today with request for head CT.  Patient reports that she slipped and fell down the stairs 2 days ago.  She denies loss of consciousness.  She reports that she hit her head on the wall as she was falling.  She reports that she has continued to have mild headaches.  She has not had any vision changes.  She has not had any vomiting.  She reports that she also had pain to her upper back but this has resolved.  She denies neck pain, numbness, tingling, weakness, vision changes, or any other concerns.  She reports that she got her x-rays with her PCP but the PCP did not have CT scan capability so she instructed her to come to the emergency department to have this test done.  Patient Active Problem List   Diagnosis Date Noted   Chronic migraine w/o aura w/o status migrainosus, not intractable 12/09/2021   Paresthesia 12/09/2021   Admission for sterilization 03/07/2021   Cellulitis 10/04/2020   MSSA (methicillin susceptible Staphylococcus aureus) infection 10/04/2020   Postpartum depression 06/19/2020   Palpitations 03/17/2019   Atypical chest pain 03/17/2019          Physical Exam   Triage Vital Signs: ED Triage Vitals  Enc Vitals Group     BP 03/06/22 1154 107/68     Pulse Rate 03/06/22 1154 (!) 101     Resp 03/06/22 1154 18     Temp 03/06/22 1154 98.3 F (36.8 C)     Temp Source 03/06/22 1154 Oral     SpO2 03/06/22 1154 97 %     Weight --      Height --      Head Circumference --      Peak Flow --      Pain Score 03/06/22 1151 5     Pain Loc --      Pain Edu? --      Excl. in GC? --     Most recent vital signs: Vitals:   03/06/22 1154  BP: 107/68  Pulse: (!) 101  Resp: 18  Temp: 98.3 F (36.8 C)  SpO2: 97%    Physical Exam Vitals and  nursing note reviewed.  Constitutional:      General: Awake and alert. No acute distress.    Appearance: Normal appearance. The patient is normal weight.  HENT:     Head: Normocephalic and atraumatic.  No raccoon eyes, hematoma, scalp laceration, or other evidence of trauma    Mouth: Mucous membranes are moist.  Eyes:     General: PERRL. Normal EOMs        Right eye: No discharge.        Left eye: No discharge.     Conjunctiva/sclera: Conjunctivae normal.  Cardiovascular:     Rate and Rhythm: Normal rate and regular rhythm.     Pulses: Normal pulses.     Heart sounds: Normal heart sounds Pulmonary:     Effort: Pulmonary effort is normal. No respiratory distress.     Breath sounds: Normal breath sounds.  Abdominal:     Abdomen is soft. There is no abdominal tenderness. No rebound or guarding. No distention. Musculoskeletal:        General: No swelling. Normal  range of motion.     Cervical back: Normal range of motion and neck supple.  No midline cervical spine tenderness.  Full range of motion of neck.  Negative Spurling test.  Negative Lhermitte sign.  Normal strength and sensation in bilateral upper extremities. Normal grip strength bilaterally.  Normal intrinsic muscle function of the hand bilaterally.  Normal radial pulses bilaterally. Skin:    General: Skin is warm and dry.     Capillary Refill: Capillary refill takes less than 2 seconds.     Findings: No rash.  Neurological:     Mental Status: The patient is awake and alert.  Neurological: GCS 15 alert and oriented x3 Normal speech, no expressive or receptive aphasia or dysarthria Cranial nerves II through XII intact Normal visual fields 5 out of 5 strength in all 4 extremities with intact sensation throughout No extremity drift Normal finger-to-nose testing, no limb or truncal ataxia Ambulatory with a steady gait     ED Results / Procedures / Treatments   Labs (all labs ordered are listed, but only abnormal results  are displayed) Labs Reviewed - No data to display   EKG     RADIOLOGY I independently reviewed and interpreted imaging and agree with radiologists findings.     PROCEDURES:  Critical Care performed:   Procedures   MEDICATIONS ORDERED IN ED: Medications - No data to display   IMPRESSION / MDM / ASSESSMENT AND PLAN / ED COURSE  I reviewed the triage vital signs and the nursing notes.   Differential diagnosis includes, but is not limited to,, contusion, less likely intracranial hemorrhage or skull fracture.  Patient alert, nontoxic in appearance and neurologically intact. No neurological deficits or midline spinal tenderness. Not encephalopathic, overall well-appearing. Physical examination and imaging reassuring against urgent or emergent traumatic process.  CT head obtained in triage is negative for any acute findings.  We discussed concussion precautions.  Discussed care plan, return precautions, and advised close outpatient follow-up. Patient agrees with plan of care.   Patient's presentation is most consistent with acute illness / injury with system symptoms.      FINAL CLINICAL IMPRESSION(S) / ED DIAGNOSES   Final diagnoses:  Fall, initial encounter  Injury of head, initial encounter     Rx / DC Orders   ED Discharge Orders     None        Note:  This document was prepared using Dragon voice recognition software and may include unintentional dictation errors.   Keturah Shavers 03/06/22 1348    Minna Antis, MD 03/06/22 1356

## 2022-03-06 NOTE — ED Triage Notes (Signed)
Pt comes with c/o slip and fall today. Pt states her PCP has already completed all xrays except CT of head bc of insurance. Pt states no loc.

## 2022-06-09 NOTE — Progress Notes (Deleted)
No chief complaint on file.     ASSESSMENT AND PLAN  Heather Jacobs is a 27 y.o. female   Female right wrist fracture, intermittent bilateral hands and feet paresthesia,  Right wrist Tinel signs, decreased pin prick at Right finger pads, most consistent with right carpal tunnel syndromes,  Suggested right wrist splint,  Chronic migraine headaches, obesity  She has frequent visual auras, starting Topamax up to 100 mg 2 tablets daily as preventive medications,  Maxalt as needed  Return to clinic with nurse practitioner in 6 months   DIAGNOSTIC DATA (LABS, IMAGING, TESTING) - I reviewed patient records, labs, notes, testing and imaging myself where available.   MEDICAL HISTORY:   Update 06/10/2022 Heather Jacobs: Patient returns for 51-monthmigraine follow-up.  Was started on topiramate at prior visit currently taking ***. Reports migraines ***.       Consult visit 12/09/2021 Dr. YKrista Blue SLavina Hamman seen in request by   BEvern Bio NP [address] [id number] BEvern Bio NP   I reviewed and summarized the referring note. PMHX PTSD Hypothyroidism   She has random tinging in hands and feet, intermittent, walkign up, hard to walk, moves it goes away, in her toes, feets, fingers, varies, once a weeks, it will last for 2 minutes, moves make it better.  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  HA normal 2-3 a week, behinds right eye, lyding down, closer her eye, stay in headace, last for 3-5 hours,   Nause,   She has nto tried medication, breast feeding.  She gained 60 LB, stay with her.  Epidural puncute,   See eye doctor, glasses doctor, saw 6 months was fine.      PHYSICAL EXAM:   There were no vitals filed for this visit.  Not recorded     There is no height or weight on file to calculate BMI.  PHYSICAL EXAMNIATION:  Gen: NAD, conversant, well nourised, well groomed                     Cardiovascular:  Regular rate rhythm, no peripheral edema, warm, nontender. Eyes: Conjunctivae clear without exudates or hemorrhage Neck: Supple, no carotid bruits. Pulmonary: Clear to auscultation bilaterally   NEUROLOGICAL EXAM:  MENTAL STATUS: Speech/cognition: Awake, alert, oriented to history taking and casual conversation CRANIAL NERVES: CN II: Visual fields are full to confrontation. Pupils are round equal and briskly reactive to light. CN III, IV, VI: extraocular movement are normal. No ptosis. CN V: Facial sensation is intact to light touch CN VII: Face is symmetric with normal eye closure  CN VIII: Hearing is normal to causal conversation. CN IX, X: Phonation is normal. CN XI: Head turning and shoulder shrug are intact  MOTOR: There is no pronator drift of out-stretched arms. Muscle bulk and tone are normal. Muscle strength is normal.  REFLEXES: Reflexes are 2+ and symmetric at the biceps, triceps, knees, and ankles. Plantar responses are flexor.  SENSORY: Intact to light touch, pinprick and vibratory sensation are intact in fingers and toes.  COORDINATION: There is no trunk or limb dysmetria noted.  GAIT/STANCE: Posture is normal. Gait is steady with normal steps, base, arm swing, and turning. Heel and toe walking are normal. Tandem gait is normal.  Romberg is absent.  REVIEW OF SYSTEMS:  Full 14 system review of systems performed and notable only for as above All other review of systems were negative.  ALLERGIES: Allergies  Allergen Reactions   Chlorhexidine Hives and Other (See Comments)    States she's allergic   Clindamycin/Lincomycin Rash    HOME MEDICATIONS: Current Outpatient Medications  Medication Sig Dispense Refill   levothyroxine (SYNTHROID) 50 MCG tablet Take 50 mcg by mouth daily before breakfast.     rizatriptan (MAXALT-MLT) 10 MG disintegrating tablet Take 1 tablet (10 mg total) by mouth as needed for migraine. May repeat in 2 hours if needed 12 tablet  11   sertraline (ZOLOFT) 50 MG tablet TAKE 1 TABLET(50 MG) BY MOUTH DAILY 90 tablet 0   topiramate (TOPAMAX) 100 MG tablet Take 1 tablet (100 mg total) by mouth 2 (two) times daily. 60 tablet 11   No current facility-administered medications for this visit.    PAST MEDICAL HISTORY: Past Medical History:  Diagnosis Date   Ectopic pregnancy    Family history of ovarian cancer    3/22 cancer genetic testing letter sent   Ruptured right tubal ectopic pregnancy causing hemoperitoneum 07/18/2018    PAST SURGICAL HISTORY: Past Surgical History:  Procedure Laterality Date   ARTHROSCOPIC REPAIR ACL     DILATION AND EVACUATION  06/27/2018   LAPAROSCOPIC BILATERAL SALPINGECTOMY Bilateral 03/07/2021   Procedure: LAPAROSCOPIC BILATERAL SALPINGECTOMY;  Surgeon: Gae Dry, MD;  Location: [facility];  Service: Gynecology;  Laterality: Bilateral;   LAPAROSCOPY N/A 07/18/2018   Procedure: LAPAROSCOPY DIAGNOSTIC,right salpingostomy;  Surgeon: Gae Dry, MD;  Location: [facility];  Service: Gynecology;  Laterality: N/A;  ectopic supected to be in blood loss    FAMILY HISTORY: Family History  Problem Relation Age of Onset   Diabetes Mellitus I Father    Heart defect Father        Aortic stenosis (born with it)   Hypothyroidism Mother    Ovarian cancer Paternal Aunt 43    SOCIAL HISTORY: Social History   Socioeconomic History   Marital status: Married    Spouse name: Social worker   Number of children: 2   Years of education: Not on file   Highest education level: Not on file  Occupational History   Not on file  Tobacco Use   Smoking status: Never   Smokeless tobacco: Never  Vaping Use   Vaping Use: Never used  Substance and Sexual Activity   Alcohol use: Never   Drug use: Never   Sexual activity: Yes    Birth control/protection: I.U.D.  Other Topics Concern   Not on file  Social History Narrative   Not on file   Social Determinants of Health   Financial Resource Strain:  Not on file  Food Insecurity: Not on file  Transportation Needs: Not on file  Physical Activity: Not on file  Stress: Not on file  Social Connections: Not on file  Intimate Partner Violence: Not on file      I spent *** minutes of face-to-face and non-face-to-face time with patient.  This included previsit chart review, lab review, study review, order entry, electronic health record documentation, patient education  Frann Rider, [facility]  [facility] [address] [place], [place] [address]  Phone [phone] Fax [phone] Note: This document was prepared with digital dictation and possible smart phrase technology. Any transcriptional errors that result from this process are unintentional.

## 2022-06-10 ENCOUNTER — Encounter: Payer: Self-pay | Admitting: Adult Health

## 2022-06-10 ENCOUNTER — Other Ambulatory Visit: Payer: Self-pay | Admitting: Neurology

## 2022-06-10 ENCOUNTER — Ambulatory Visit: Payer: 59 | Admitting: Adult Health

## 2022-06-10 DIAGNOSIS — R259 Unspecified abnormal involuntary movements: Secondary | ICD-10-CM

## 2022-06-10 DIAGNOSIS — G379 Demyelinating disease of central nervous system, unspecified: Secondary | ICD-10-CM

## 2022-06-10 DIAGNOSIS — R200 Anesthesia of skin: Secondary | ICD-10-CM

## 2022-06-10 DIAGNOSIS — R519 Headache, unspecified: Secondary | ICD-10-CM

## 2022-06-17 ENCOUNTER — Ambulatory Visit: Payer: Medicaid Other | Admitting: Nurse Practitioner

## 2022-06-24 ENCOUNTER — Ambulatory Visit: Payer: Medicaid Other | Admitting: Nurse Practitioner

## 2022-06-30 ENCOUNTER — Other Ambulatory Visit: Payer: Medicaid Other

## 2022-06-30 ENCOUNTER — Other Ambulatory Visit: Payer: Self-pay | Admitting: Nurse Practitioner

## 2022-07-01 ENCOUNTER — Ambulatory Visit
Admission: RE | Admit: 2022-07-01 | Discharge: 2022-07-01 | Disposition: A | Discharge status: Home / Self Care | Payer: Medicaid Other | Source: Ambulatory Visit | Attending: Neurology | Admitting: Neurology

## 2022-07-01 ENCOUNTER — Ambulatory Visit: Payer: Medicaid Other | Admitting: Nurse Practitioner

## 2022-07-01 DIAGNOSIS — R259 Unspecified abnormal involuntary movements: Secondary | ICD-10-CM

## 2022-07-01 DIAGNOSIS — R519 Headache, unspecified: Secondary | ICD-10-CM

## 2022-07-01 DIAGNOSIS — G379 Demyelinating disease of central nervous system, unspecified: Secondary | ICD-10-CM

## 2022-07-01 DIAGNOSIS — R202 Paresthesia of skin: Secondary | ICD-10-CM

## 2022-07-01 LAB — COMPREHENSIVE METABOLIC PANEL
ALT: 19 IU/L (ref 0–32)
AST: 22 IU/L (ref 0–40)
Albumin/Globulin Ratio: 1.8 (ref 1.2–2.2)
Albumin: 4.4 g/dL (ref 4.0–5.0)
Alkaline Phosphatase: 74 IU/L (ref 44–121)
BUN/Creatinine Ratio: 12 (ref 9–23)
BUN: 11 mg/dL (ref 6–20)
Bilirubin Total: 0.3 mg/dL (ref 0.0–1.2)
CO2: 23 mmol/L (ref 20–29)
Calcium: 9.1 mg/dL (ref 8.7–10.2)
Chloride: 104 mmol/L (ref 96–106)
Creatinine, Ser: 0.89 mg/dL (ref 0.57–1.00)
Globulin, Total: 2.4 g/dL (ref 1.5–4.5)
Glucose: 90 mg/dL (ref 70–99)
Potassium: 4.4 mmol/L (ref 3.5–5.2)
Sodium: 140 mmol/L (ref 134–144)
Total Protein: 6.8 g/dL (ref 6.0–8.5)
eGFR: 91 mL/min/{1.73_m2} (ref >59)

## 2022-07-01 LAB — LIPID PANEL W/O CHOL/HDL RATIO
Cholesterol, Total: 134 mg/dL (ref 100–199)
HDL: 47 mg/dL (ref >39)
LDL Chol Calc (NIH): 76 mg/dL (ref 0–99)
Triglycerides: 49 mg/dL (ref 0–149)
VLDL Cholesterol Cal: 11 mg/dL (ref 5–40)

## 2022-07-01 LAB — CBC WITH DIFFERENTIAL/PLATELET
Basophils Absolute: 0 10*3/uL (ref 0.0–0.2)
Basos: 0 %
EOS (ABSOLUTE): 0.1 10*3/uL (ref 0.0–0.4)
Eos: 1 %
Hematocrit: 37.8 % (ref 34.0–46.6)
Hemoglobin: 12.1 g/dL (ref 11.1–15.9)
Immature Grans (Abs): 0 10*3/uL (ref 0.0–0.1)
Immature Granulocytes: 0 %
Lymphocytes Absolute: 1.5 10*3/uL (ref 0.7–3.1)
Lymphs: 25 %
MCH: 28.6 pg (ref 26.6–33.0)
MCHC: 32 g/dL (ref 31.5–35.7)
MCV: 89 fL (ref 79–97)
Monocytes Absolute: 0.4 10*3/uL (ref 0.1–0.9)
Monocytes: 6 %
Neutrophils Absolute: 4 10*3/uL (ref 1.4–7.0)
Neutrophils: 68 %
Platelets: 237 10*3/uL (ref 150–450)
RBC: 4.23 x10E6/uL (ref 3.77–5.28)
RDW: 12.3 % (ref 11.7–15.4)
WBC: 5.9 10*3/uL (ref 3.4–10.8)

## 2022-07-01 LAB — TSH: TSH: 3.04 u[IU]/mL (ref 0.450–4.500)

## 2022-07-08 ENCOUNTER — Ambulatory Visit: Payer: Medicaid Other | Admitting: Nurse Practitioner

## 2022-07-12 ENCOUNTER — Encounter: Payer: Self-pay | Admitting: Nurse Practitioner

## 2022-07-15 ENCOUNTER — Encounter: Payer: Self-pay | Admitting: Internal Medicine

## 2022-07-15 ENCOUNTER — Ambulatory Visit: Payer: Medicaid Other | Admitting: Internal Medicine

## 2022-07-15 VITALS — BP 124/70 | HR 103 | Ht 68.0 in | Wt 267.0 lb

## 2022-07-15 DIAGNOSIS — E049 Nontoxic goiter, unspecified: Secondary | ICD-10-CM | POA: Diagnosis not present

## 2022-07-15 DIAGNOSIS — E038 Other specified hypothyroidism: Secondary | ICD-10-CM

## 2022-07-15 DIAGNOSIS — R5383 Other fatigue: Secondary | ICD-10-CM

## 2022-07-15 DIAGNOSIS — G43709 Chronic migraine without aura, not intractable, without status migrainosus: Secondary | ICD-10-CM

## 2022-07-15 DIAGNOSIS — R202 Paresthesia of skin: Secondary | ICD-10-CM

## 2022-07-15 NOTE — Progress Notes (Signed)
Established Patient Office Visit  Subjective:  Patient ID: Heather Jacobs, female    DOB: 02/23/1996  Age: 27 y.o. MRN: [id number]  Chief Complaint  Patient presents with   Follow-up    3 month follow up with lab results    Patient comes in for a follow-up and also to inform us that her mother has been diagnosed with Hashimoto's thyroiditis.  Patient is concerned about herself as well and wants blood work done today. In general patient is feeling better, except for chronic fatigue. Some of her medications have been changed by the neurologist so her paresthesias have improved.  She has been taken off Zoloft and Topamax, and Nortriptyline and Effexor has been started.  Patient is tolerating them well. Her nortriptyline dose has been increased to 20 mg at bedtime.  And her Effexor is 150 mg/day. She is also getting EEG done.     No other concerns at this time.   Past Medical History:  Diagnosis Date   Ectopic pregnancy    Family history of ovarian cancer    3/22 cancer genetic testing letter sent   Ruptured right tubal ectopic pregnancy causing hemoperitoneum 07/18/2018    Past Surgical History:  Procedure Laterality Date   ARTHROSCOPIC REPAIR ACL     DILATION AND EVACUATION  06/27/2018   LAPAROSCOPIC BILATERAL SALPINGECTOMY Bilateral 03/07/2021   Procedure: LAPAROSCOPIC BILATERAL SALPINGECTOMY;  Surgeon: Gae Dry, MD;  Location: [facility];  Service: Gynecology;  Laterality: Bilateral;   LAPAROSCOPY N/A 07/18/2018   Procedure: LAPAROSCOPY DIAGNOSTIC,right salpingostomy;  Surgeon: Gae Dry, MD;  Location: [facility];  Service: Gynecology;  Laterality: N/A;  ectopic supected to be in blood loss    Social History   Socioeconomic History   Marital status: Married    Spouse name: Social worker   Number of children: 2   Years of education: Not on file   Highest education level: Not on file  Occupational History   Not on file  Tobacco Use   Smoking status: Never    Smokeless tobacco: Never  Vaping Use   Vaping Use: Never used  Substance and Sexual Activity   Alcohol use: Never   Drug use: Never   Sexual activity: Yes    Birth control/protection: I.U.D.  Other Topics Concern   Not on file  Social History Narrative   Not on file   Social Determinants of Health   Financial Resource Strain: Not on file  Food Insecurity: Not on file  Transportation Needs: Not on file  Physical Activity: Not on file  Stress: Not on file  Social Connections: Not on file  Intimate Partner Violence: Not on file    Family History  Problem Relation Age of Onset   Hypothyroidism Mother    Hashimoto's thyroiditis Mother    Diabetes Mellitus I Father    Heart defect Father        Aortic stenosis (born with it)   Ovarian cancer Paternal Aunt 78    Allergies  Allergen Reactions   Chlorhexidine Hives and Other (See Comments)    States she's allergic   Clindamycin/Lincomycin Rash    Review of Systems  Constitutional:  Positive for malaise/fatigue. Negative for chills, diaphoresis, fever and weight loss.  HENT: Negative.    Eyes: Negative.   Respiratory: Negative.    Cardiovascular: Negative.   Gastrointestinal: Negative.   Genitourinary: Negative.   Musculoskeletal:  Positive for joint pain and myalgias.  Skin: Negative.   Neurological:  Positive for dizziness, sensory  change and headaches. Negative for speech change, focal weakness, seizures and weakness.  Endo/Heme/Allergies: Negative.   Psychiatric/Behavioral:  Negative for depression, substance abuse and suicidal ideas. The patient is nervous/anxious.        Objective:   BP 124/70   Pulse (!) 103   Ht 5\' 8"  (1.727 m)   Wt 267 lb (121.1 kg)   SpO2 99%   BMI 40.60 kg/m   Vitals:   07/15/22 0912  BP: 124/70  Pulse: (!) 103  Height: 5\' 8"  (1.727 m)  Weight: 267 lb (121.1 kg)  SpO2: 99%  BMI (Calculated): 40.61    Physical Exam Vitals and nursing note reviewed.  Constitutional:       Appearance: Normal appearance. She is obese.  Cardiovascular:     Rate and Rhythm: Normal rate and regular rhythm.     Pulses: Normal pulses.     Heart sounds: Normal heart sounds.  Pulmonary:     Effort: Pulmonary effort is normal.     Breath sounds: Normal breath sounds.  Abdominal:     General: Abdomen is flat.     Palpations: Abdomen is soft.  Musculoskeletal:        General: Normal range of motion.     Cervical back: Normal range of motion and neck supple.  Neurological:     General: No focal deficit present.     Mental Status: She is alert and oriented to person, place, and time.      No results found for any visits on 07/15/22.  Recent Results (from the past 2160 hour(s))  CBC with Differential/Platelet     Status: None   Collection Time: 06/30/22  2:53 PM  Result Value Ref Range   WBC 5.9 3.4 - 10.8 x10E3/uL   RBC 4.23 3.77 - 5.28 x10E6/uL   Hemoglobin 12.1 11.1 - 15.9 g/dL   Hematocrit 37.8 34.0 - 46.6 %   MCV 89 79 - 97 fL   MCH 28.6 26.6 - 33.0 pg   MCHC 32.0 31.5 - 35.7 g/dL   RDW 12.3 11.7 - 15.4 %   Platelets 237 150 - 450 x10E3/uL   Neutrophils 68 Not Estab. %   Lymphs 25 Not Estab. %   Monocytes 6 Not Estab. %   Eos 1 Not Estab. %   Basos 0 Not Estab. %   Neutrophils Absolute 4.0 1.4 - 7.0 x10E3/uL   Lymphocytes Absolute 1.5 0.7 - 3.1 x10E3/uL   Monocytes Absolute 0.4 0.1 - 0.9 x10E3/uL   EOS (ABSOLUTE) 0.1 0.0 - 0.4 x10E3/uL   Basophils Absolute 0.0 0.0 - 0.2 x10E3/uL   Immature Granulocytes 0 Not Estab. %   Immature Grans (Abs) 0.0 0.0 - 0.1 x10E3/uL  Comprehensive metabolic panel     Status: None   Collection Time: 06/30/22  2:53 PM  Result Value Ref Range   Glucose 90 70 - 99 mg/dL   BUN 11 6 - 20 mg/dL   Creatinine, Ser 0.89 0.57 - 1.00 mg/dL   eGFR 91 >59 mL/min/1.73   BUN/Creatinine Ratio 12 9 - 23   Sodium 140 134 - 144 mmol/L   Potassium 4.4 3.5 - 5.2 mmol/L   Chloride 104 96 - 106 mmol/L   CO2 23 20 - 29 mmol/L   Calcium 9.1 8.7 -  10.2 mg/dL   Total Protein 6.8 6.0 - 8.5 g/dL   Albumin 4.4 4.0 - 5.0 g/dL   Globulin, Total 2.4 1.5 - 4.5 g/dL   Albumin/Globulin Ratio 1.8 1.2 -  2.2   Bilirubin Total 0.3 0.0 - 1.2 mg/dL   Alkaline Phosphatase 74 44 - 121 IU/L   AST 22 0 - 40 IU/L   ALT 19 0 - 32 IU/L  Lipid Panel w/o Chol/HDL Ratio     Status: None   Collection Time: 06/30/22  2:53 PM  Result Value Ref Range   Cholesterol, Total 134 100 - 199 mg/dL   Triglycerides 49 0 - 149 mg/dL   HDL 47 >39 mg/dL   VLDL Cholesterol Cal 11 5 - 40 mg/dL   LDL Chol Calc (NIH) 76 0 - 99 mg/dL  TSH     Status: None   Collection Time: 06/30/22  2:53 PM  Result Value Ref Range   TSH 3.040 0.450 - 4.500 uIU/mL      Assessment & Plan:  Schedule thyroid ultrasound as well as thyroid antibodies profile.  Patient will return to discuss the lab results Problem List Items Addressed This Visit     Chronic migraine w/o aura w/o status migrainosus, not intractable   Relevant Medications   venlafaxine XR (EFFEXOR-XR) 75 MG 24 hr capsule   nortriptyline (PAMELOR) 10 MG capsule   Paresthesia   Other fatigue   Relevant Orders   CMP14+EGFR   CBC With Differential   Other specified hypothyroidism   Relevant Medications   levothyroxine (SYNTHROID) 75 MCG tablet   Other Relevant Orders   TSH+T4F+T3Free   Thyroid antibodies   Goiter - Primary   Relevant Medications   levothyroxine (SYNTHROID) 75 MCG tablet   Other Relevant Orders   US THYROID   TSH+T4F+T3Free   Thyroid antibodies    Return in about 3 weeks (around 08/05/2022).   Total time spent: 30 minutes  Perrin Maltese, MD  07/15/2022

## 2022-07-17 LAB — CMP14+EGFR
ALT: 18 IU/L (ref 0–32)
AST: 20 IU/L (ref 0–40)
Albumin/Globulin Ratio: 1.8 (ref 1.2–2.2)
Albumin: 4.2 g/dL (ref 4.0–5.0)
Alkaline Phosphatase: 71 IU/L (ref 44–121)
BUN/Creatinine Ratio: 13 (ref 9–23)
BUN: 10 mg/dL (ref 6–20)
Bilirubin Total: 0.3 mg/dL (ref 0.0–1.2)
CO2: 23 mmol/L (ref 20–29)
Calcium: 9.3 mg/dL (ref 8.7–10.2)
Chloride: 105 mmol/L (ref 96–106)
Creatinine, Ser: 0.77 mg/dL (ref 0.57–1.00)
Globulin, Total: 2.4 g/dL (ref 1.5–4.5)
Glucose: 81 mg/dL (ref 70–99)
Potassium: 5 mmol/L (ref 3.5–5.2)
Sodium: 141 mmol/L (ref 134–144)
Total Protein: 6.6 g/dL (ref 6.0–8.5)
eGFR: 108 mL/min/{1.73_m2} (ref >59)

## 2022-07-17 LAB — CBC WITH DIFFERENTIAL
Basophils Absolute: 0 10*3/uL (ref 0.0–0.2)
Basos: 0 %
EOS (ABSOLUTE): 0.1 10*3/uL (ref 0.0–0.4)
Eos: 1 %
Hematocrit: 37.4 % (ref 34.0–46.6)
Hemoglobin: 12.5 g/dL (ref 11.1–15.9)
Immature Grans (Abs): 0 10*3/uL (ref 0.0–0.1)
Immature Granulocytes: 1 %
Lymphocytes Absolute: 1.1 10*3/uL (ref 0.7–3.1)
Lymphs: 21 %
MCH: 28.5 pg (ref 26.6–33.0)
MCHC: 33.4 g/dL (ref 31.5–35.7)
MCV: 85 fL (ref 79–97)
Monocytes Absolute: 0.4 10*3/uL (ref 0.1–0.9)
Monocytes: 8 %
Neutrophils Absolute: 3.7 10*3/uL (ref 1.4–7.0)
Neutrophils: 69 %
RBC: 4.39 x10E6/uL (ref 3.77–5.28)
RDW: 12.2 % (ref 11.7–15.4)
WBC: 5.4 10*3/uL (ref 3.4–10.8)

## 2022-07-17 LAB — THYROID ANTIBODIES
Thyroglobulin Antibody: 1.4 IU/mL — ABNORMAL HIGH (ref 0.0–0.9)
Thyroperoxidase Ab SerPl-aCnc: 281 IU/mL — ABNORMAL HIGH (ref 0–34)

## 2022-07-17 LAB — TSH+T4F+T3FREE
Free T4: 1.08 ng/dL (ref 0.82–1.77)
T3, Free: 3.3 pg/mL (ref 2.0–4.4)
TSH: 2.51 u[IU]/mL (ref 0.450–4.500)

## 2022-07-22 ENCOUNTER — Ambulatory Visit
Admission: RE | Admit: 2022-07-22 | Discharge: 2022-07-22 | Disposition: A | Discharge status: Home / Self Care | Payer: Medicaid Other | Source: Ambulatory Visit | Attending: Internal Medicine | Admitting: Internal Medicine

## 2022-07-22 DIAGNOSIS — E049 Nontoxic goiter, unspecified: Secondary | ICD-10-CM | POA: Diagnosis not present

## 2022-08-12 ENCOUNTER — Ambulatory Visit: Payer: Medicaid Other | Admitting: Nurse Practitioner

## 2022-08-19 ENCOUNTER — Encounter: Payer: Self-pay | Admitting: Nurse Practitioner

## 2022-08-19 ENCOUNTER — Ambulatory Visit: Payer: Medicaid Other | Admitting: Nurse Practitioner

## 2022-08-19 VITALS — BP 110/78 | HR 93 | Ht 68.5 in | Wt 266.6 lb

## 2022-08-19 DIAGNOSIS — E038 Other specified hypothyroidism: Secondary | ICD-10-CM

## 2022-08-19 DIAGNOSIS — E041 Nontoxic single thyroid nodule: Secondary | ICD-10-CM

## 2022-08-19 DIAGNOSIS — R5383 Other fatigue: Secondary | ICD-10-CM

## 2022-08-19 DIAGNOSIS — G43709 Chronic migraine without aura, not intractable, without status migrainosus: Secondary | ICD-10-CM

## 2022-08-19 NOTE — Patient Instructions (Signed)
1) Endocrinology referral 2) Follow up appt in 3 months, 2 weeks, fasting labs prior

## 2022-08-19 NOTE — Progress Notes (Signed)
Established Patient Office Visit  Subjective:  Patient ID: Heather Jacobs, female    DOB: [birth year]  Age: 27 y.o. MRN: [id number]  No chief complaint on file.   3 week follow up, reviewed and gave copy of thyroid US.  Referring patient to endocrinology.  Other labs at goal.      No other concerns at this time.   Past Medical History:  Diagnosis Date   Ectopic pregnancy    Family history of ovarian cancer    3/22 cancer genetic testing letter sent   Ruptured right tubal ectopic pregnancy causing hemoperitoneum 07/18/2018    Past Surgical History:  Procedure Laterality Date   ARTHROSCOPIC REPAIR ACL     DILATION AND EVACUATION  06/27/2018   LAPAROSCOPIC BILATERAL SALPINGECTOMY Bilateral 03/07/2021   Procedure: LAPAROSCOPIC BILATERAL SALPINGECTOMY;  Surgeon: Nadara Mustard, MD;  Location: [facility];  Service: Gynecology;  Laterality: Bilateral;   LAPAROSCOPY N/A 07/18/2018   Procedure: LAPAROSCOPY DIAGNOSTIC,right salpingostomy;  Surgeon: Nadara Mustard, MD;  Location: [facility];  Service: Gynecology;  Laterality: N/A;  ectopic supected to be in blood loss    Social History   Socioeconomic History   Marital status: Married    Spouse name: Nature conservation officer   Number of children: 2   Years of education: Not on file   Highest education level: Not on file  Occupational History   Not on file  Tobacco Use   Smoking status: Never   Smokeless tobacco: Never  Vaping Use   Vaping Use: Never used  Substance and Sexual Activity   Alcohol use: Never   Drug use: Never   Sexual activity: Yes    Birth control/protection: I.U.D.  Other Topics Concern   Not on file  Social History Narrative   Not on file   Social Determinants of Health   Financial Resource Strain: Not on file  Food Insecurity: Not on file  Transportation Needs: Not on file  Physical Activity: Not on file  Stress: Not on file  Social Connections: Not on file  Intimate Partner Violence: Not on file    Family  History  Problem Relation Age of Onset   Hypothyroidism Mother    Hashimoto's thyroiditis Mother    Diabetes Mellitus I Father    Heart defect Father        Aortic stenosis (born with it)   Ovarian cancer Paternal Aunt 39    Allergies  Allergen Reactions   Chlorhexidine Hives and Other (See Comments)    States she's allergic   Clindamycin/Lincomycin Rash    Review of Systems  Constitutional: Negative.   HENT: Negative.    Eyes: Negative.   Respiratory: Negative.    Cardiovascular: Negative.   Gastrointestinal: Negative.   Genitourinary: Negative.   Musculoskeletal: Negative.   Skin: Negative.   Neurological: Negative.   Endo/Heme/Allergies: Negative.   Psychiatric/Behavioral: Negative.         Objective:   There were no vitals taken for this visit.  There were no vitals filed for this visit.  Physical Exam Vitals reviewed.  Constitutional:      Appearance: Normal appearance.  HENT:     Head: Normocephalic.     Nose: Nose normal.     Mouth/Throat:     Mouth: Mucous membranes are moist.  Eyes:     Pupils: Pupils are equal, round, and reactive to light.  Cardiovascular:     Rate and Rhythm: Normal rate and regular rhythm.  Pulmonary:     Effort: Pulmonary  effort is normal.     Breath sounds: Normal breath sounds.  Abdominal:     General: Bowel sounds are normal.     Palpations: Abdomen is soft.  Musculoskeletal:        General: Normal range of motion.     Cervical back: Normal range of motion and neck supple.  Skin:    General: Skin is warm and dry.  Neurological:     Mental Status: She is alert and oriented to person, place, and time.  Psychiatric:        Mood and Affect: Mood normal.        Behavior: Behavior normal.      No results found for any visits on 08/19/22.  Recent Results (from the past 2160 hour(s))  CBC with Differential/Platelet     Status: None   Collection Time: 06/30/22  2:53 PM  Result Value Ref Range   WBC 5.9 3.4 - 10.8  x10E3/uL   RBC 4.23 3.77 - 5.28 x10E6/uL   Hemoglobin 12.1 11.1 - 15.9 g/dL   Hematocrit 16.1 09.6 - 46.6 %   MCV 89 79 - 97 fL   MCH 28.6 26.6 - 33.0 pg   MCHC 32.0 31.5 - 35.7 g/dL   RDW 04.5 40.9 - 81.1 %   Platelets 237 150 - 450 x10E3/uL   Neutrophils 68 Not Estab. %   Lymphs 25 Not Estab. %   Monocytes 6 Not Estab. %   Eos 1 Not Estab. %   Basos 0 Not Estab. %   Neutrophils Absolute 4.0 1.4 - 7.0 x10E3/uL   Lymphocytes Absolute 1.5 0.7 - 3.1 x10E3/uL   Monocytes Absolute 0.4 0.1 - 0.9 x10E3/uL   EOS (ABSOLUTE) 0.1 0.0 - 0.4 x10E3/uL   Basophils Absolute 0.0 0.0 - 0.2 x10E3/uL   Immature Granulocytes 0 Not Estab. %   Immature Grans (Abs) 0.0 0.0 - 0.1 x10E3/uL  Comprehensive metabolic panel     Status: None   Collection Time: 06/30/22  2:53 PM  Result Value Ref Range   Glucose 90 70 - 99 mg/dL   BUN 11 6 - 20 mg/dL   Creatinine, Ser 9.14 0.57 - 1.00 mg/dL   eGFR 91 >78 GN/FAO/1.30   BUN/Creatinine Ratio 12 9 - 23   Sodium 140 134 - 144 mmol/L   Potassium 4.4 3.5 - 5.2 mmol/L   Chloride 104 96 - 106 mmol/L   CO2 23 20 - 29 mmol/L   Calcium 9.1 8.7 - 10.2 mg/dL   Total Protein 6.8 6.0 - 8.5 g/dL   Albumin 4.4 4.0 - 5.0 g/dL   Globulin, Total 2.4 1.5 - 4.5 g/dL   Albumin/Globulin Ratio 1.8 1.2 - 2.2   Bilirubin Total 0.3 0.0 - 1.2 mg/dL   Alkaline Phosphatase 74 44 - 121 IU/L   AST 22 0 - 40 IU/L   ALT 19 0 - 32 IU/L  Lipid Panel w/o Chol/HDL Ratio     Status: None   Collection Time: 06/30/22  2:53 PM  Result Value Ref Range   Cholesterol, Total 134 100 - 199 mg/dL   Triglycerides 49 0 - 149 mg/dL   HDL 47 >86 mg/dL   VLDL Cholesterol Cal 11 5 - 40 mg/dL   LDL Chol Calc (NIH) 76 0 - 99 mg/dL  TSH     Status: None   Collection Time: 06/30/22  2:53 PM  Result Value Ref Range   TSH 3.040 0.450 - 4.500 uIU/mL  TSH+T4F+T3Free  Status: None   Collection Time: 07/15/22  9:44 AM  Result Value Ref Range   TSH 2.510 0.450 - 4.500 uIU/mL   T3, Free 3.3 2.0 - 4.4  pg/mL   Free T4 1.08 0.82 - 1.77 ng/dL  Thyroid antibodies     Status: Abnormal   Collection Time: 07/15/22  9:44 AM  Result Value Ref Range   Thyroperoxidase Ab SerPl-aCnc 281 (H) 0 - 34 IU/mL   Thyroglobulin Antibody 1.4 (H) 0.0 - 0.9 IU/mL    Comment: Thyroglobulin Antibody measured by [employer] Methodology It should be noted that the presence of thyroglobulin antibodies may not be pathogenic nor diagnostic, especially at very low levels. The assay manufacturer has found that four percent of individuals without evidence of thyroid disease or autoimmunity will have positive TgAb levels up to 4 IU/mL.   CMP14+EGFR     Status: None   Collection Time: 07/15/22  9:44 AM  Result Value Ref Range   Glucose 81 70 - 99 mg/dL   BUN 10 6 - 20 mg/dL   Creatinine, Ser 1.61 0.57 - 1.00 mg/dL   eGFR 096 >04 VW/UJW/1.19   BUN/Creatinine Ratio 13 9 - 23   Sodium 141 134 - 144 mmol/L   Potassium 5.0 3.5 - 5.2 mmol/L   Chloride 105 96 - 106 mmol/L   CO2 23 20 - 29 mmol/L   Calcium 9.3 8.7 - 10.2 mg/dL   Total Protein 6.6 6.0 - 8.5 g/dL   Albumin 4.2 4.0 - 5.0 g/dL   Globulin, Total 2.4 1.5 - 4.5 g/dL   Albumin/Globulin Ratio 1.8 1.2 - 2.2   Bilirubin Total 0.3 0.0 - 1.2 mg/dL   Alkaline Phosphatase 71 44 - 121 IU/L   AST 20 0 - 40 IU/L   ALT 18 0 - 32 IU/L  CBC With Differential     Status: None   Collection Time: 07/15/22  9:44 AM  Result Value Ref Range   WBC 5.4 3.4 - 10.8 x10E3/uL   RBC 4.39 3.77 - 5.28 x10E6/uL   Hemoglobin 12.5 11.1 - 15.9 g/dL   Hematocrit 14.7 82.9 - 46.6 %   MCV 85 79 - 97 fL   MCH 28.5 26.6 - 33.0 pg   MCHC 33.4 31.5 - 35.7 g/dL   RDW 56.2 13.0 - 86.5 %   Neutrophils 69 Not Estab. %   Lymphs 21 Not Estab. %   Monocytes 8 Not Estab. %   Eos 1 Not Estab. %   Basos 0 Not Estab. %   Neutrophils Absolute 3.7 1.4 - 7.0 x10E3/uL   Lymphocytes Absolute 1.1 0.7 - 3.1 x10E3/uL   Monocytes Absolute 0.4 0.1 - 0.9 x10E3/uL   EOS (ABSOLUTE) 0.1 0.0 - 0.4  x10E3/uL   Basophils Absolute 0.0 0.0 - 0.2 x10E3/uL   Immature Granulocytes 1 Not Estab. %   Immature Grans (Abs) 0.0 0.0 - 0.1 x10E3/uL      Assessment & Plan:   Problem List Items Addressed This Visit   None   No follow-ups on file.   Total time spent: 35 minutes  Orson Eva, NP  08/19/2022

## 2022-08-22 ENCOUNTER — Ambulatory Visit: Payer: Medicaid Other | Admitting: Cardiovascular Disease

## 2022-09-04 ENCOUNTER — Other Ambulatory Visit: Payer: Self-pay | Admitting: Nurse Practitioner

## 2022-10-08 ENCOUNTER — Other Ambulatory Visit: Payer: Self-pay

## 2022-10-08 ENCOUNTER — Other Ambulatory Visit (INDEPENDENT_AMBULATORY_CARE_PROVIDER_SITE_OTHER): Payer: Medicaid Other

## 2022-10-08 DIAGNOSIS — E041 Nontoxic single thyroid nodule: Secondary | ICD-10-CM | POA: Diagnosis not present

## 2022-10-08 LAB — TSH: TSH: 4.27 u[IU]/mL (ref 0.35–5.50)

## 2022-10-08 LAB — T4, FREE: Free T4: 0.66 ng/dL (ref 0.60–1.60)

## 2022-10-14 ENCOUNTER — Encounter: Payer: Self-pay | Admitting: "Endocrinology

## 2022-10-14 ENCOUNTER — Ambulatory Visit (INDEPENDENT_AMBULATORY_CARE_PROVIDER_SITE_OTHER): Payer: Medicaid Other | Admitting: "Endocrinology

## 2022-10-14 VITALS — BP 115/70 | HR 94 | Ht 68.5 in | Wt 261.2 lb

## 2022-10-14 DIAGNOSIS — E038 Other specified hypothyroidism: Secondary | ICD-10-CM

## 2022-10-14 DIAGNOSIS — E041 Nontoxic single thyroid nodule: Secondary | ICD-10-CM | POA: Diagnosis not present

## 2022-10-14 DIAGNOSIS — E063 Autoimmune thyroiditis: Secondary | ICD-10-CM | POA: Diagnosis not present

## 2022-10-14 NOTE — Progress Notes (Signed)
Outpatient Endocrinology Note Heather Hamilton, MD  10/14/22   Heather Jacobs [birth year] [id number]  Referring Provider: Orson Eva, NP Primary Care Provider: Orson Eva, NP Subjective  Chief Complaint  Patient presents with   Thyroid Nodule    Assessment & Plan  Heather Jacobs was seen today for thyroid nodule.  Diagnoses and all orders for this visit:  Hypothyroidism due to Hashimoto's thyroiditis -     TSH Rfx on Abnormal to Free T4; Future  Thyroid nodule    Heather Jacobs is currently taking levothyroxine 75 mcg po qd. Patient is currently biochemically euthyroid.  Educated on thyroid axis.  Recommend the following: Take levothyroxine 75 every morning.  Advised to take levothyroxine first thing in the morning on empty stomach and wait at least 30 minutes to 1 hour before eating or drinking anything or taking any other medications. Space out levothyroxine by 4 hours from any acid reflux medication/fibrate/iron/calcium/multivitamin. Advised to take birth control pills and nutritional supplements in the evening. Repeat lab before next visit or sooner if symptoms of hyperthyroidism or hypothyroidism develop.  Notify us immediately in case of pregnancy/breastfeeding or significant weight gain or loss. Counseled on compliance and follow up needs.  Reviewed 06/2022 thyroid U/S images and report independently  Pt has a 8 mm right thyroid nodule  No compressive symptoms  No follow up indicated at this time    I have reviewed current medications, nurse's notes, allergies, vital signs, past medical and surgical history, family medical history, and social history for this encounter. Counseled patient on symptoms, examination findings, lab findings, imaging results, treatment decisions and monitoring and prognosis. The patient understood the recommendations and agrees with the treatment plan. All questions regarding treatment plan were fully answered.   Return in about  4 months (around 02/13/2023) for visit, labs today.   Heather Arnot, MD  10/14/22   I have reviewed current medications, nurse's notes, allergies, vital signs, past medical and surgical history, family medical history, and social history for this encounter. Counseled patient on symptoms, examination findings, lab findings, imaging results, treatment decisions and monitoring and prognosis. The patient understood the recommendations and agrees with the treatment plan. All questions regarding treatment plan were fully answered.   History of Present Illness Heather Jacobs is a 27 y.o. year old female who presents to our clinic with hypothyroidism and thyroid nodule.  Patient reports:    fatigue Yes cold intolerance  Yes constipation  Yes  heat intolerance No hyperdefecation  No palpitations  No  Compressive symptoms:  dysphagia  No dysphonia  No positional dyspnea (especially with simultaneous arms elevation)  No  Smokes  No On biotin  No Personal history of head/neck surgery/irradiation  No  Physical Exam  BP 115/70   Pulse 94   Ht 5' 8.5" (1.74 m)   Wt 261 lb 3.2 oz (118.5 kg)   SpO2 99%   BMI 39.14 kg/m  Constitutional: well developed, well nourished Head: normocephalic, atraumatic, no exophthalmos Eyes: sclera anicteric, no redness Neck: no thyromegaly, no thyroid tenderness; no nodules palpated, Pemberton's sign -ve  Lungs: normal respiratory effort Neurology: alert and oriented, no fine hand tremor Skin: dry, no appreciable rashes Musculoskeletal: no appreciable defects Psychiatric: normal mood and affect  Allergies Allergies  Allergen Reactions   Chlorhexidine Hives and Other (See Comments)    States she's allergic   Clindamycin/Lincomycin Rash    Current Medications Patient's Medications  New Prescriptions   No medications on file  Previous Medications  BUTALBITAL-ACETAMINOPHEN-CAFFEINE (FIORICET) 50-325-40 MG TABLET    Take 1 tablet by mouth  every 4 (four) hours as needed (has needed for migraines).   LEVOTHYROXINE (SYNTHROID) 75 MCG TABLET    TAKE 1 TABLET BY MOUTH EVERY MORNING, 1 HOUR PRIOR TO OTHER MEDS AND FOODS   NORTRIPTYLINE (PAMELOR) 10 MG CAPSULE    10 mg at bedtime.   PENICILLIN V POTASSIUM (VEETID) 500 MG TABLET    Take 500 mg by mouth 2 (two) times daily.   RIZATRIPTAN (MAXALT-MLT) 10 MG DISINTEGRATING TABLET    Take 1 tablet (10 mg total) by mouth as needed for migraine. May repeat in 2 hours if needed   VENLAFAXINE XR (EFFEXOR-XR) 75 MG 24 HR CAPSULE    Take 75 mg by mouth daily with breakfast.  Modified Medications   No medications on file  Discontinued Medications   No medications on file    Past Medical History Past Medical History:  Diagnosis Date   Ectopic pregnancy    Family history of ovarian cancer    3/22 cancer genetic testing letter sent   Ruptured right tubal ectopic pregnancy causing hemoperitoneum 07/18/2018    Past Surgical History Past Surgical History:  Procedure Laterality Date   ARTHROSCOPIC REPAIR ACL     DILATION AND EVACUATION  06/27/2018   LAPAROSCOPIC BILATERAL SALPINGECTOMY Bilateral 03/07/2021   Procedure: LAPAROSCOPIC BILATERAL SALPINGECTOMY;  Surgeon: Nadara Mustard, MD;  Location: [facility];  Service: Gynecology;  Laterality: Bilateral;   LAPAROSCOPY N/A 07/18/2018   Procedure: LAPAROSCOPY DIAGNOSTIC,right salpingostomy;  Surgeon: Nadara Mustard, MD;  Location: [facility];  Service: Gynecology;  Laterality: N/A;  ectopic supected to be in blood loss    Family History family history includes Diabetes Mellitus I in her father; Hashimoto's thyroiditis in her mother; Heart defect in her father; Hypothyroidism in her mother; Ovarian cancer (age of onset: 9) in her paternal aunt.  Social History Social History   Socioeconomic History   Marital status: Married    Spouse name: Nature conservation officer   Number of children: 2   Years of education: Not on file   Highest education level: Not on  file  Occupational History   Not on file  Tobacco Use   Smoking status: Never   Smokeless tobacco: Never  Vaping Use   Vaping Use: Never used  Substance and Sexual Activity   Alcohol use: Never   Drug use: Never   Sexual activity: Yes    Birth control/protection: I.U.D.  Other Topics Concern   Not on file  Social History Narrative   Not on file   Social Determinants of Health   Financial Resource Strain: Not on file  Food Insecurity: Not on file  Transportation Needs: Not on file  Physical Activity: Not on file  Stress: Not on file  Social Connections: Not on file  Intimate Partner Violence: Not on file    Laboratory Investigations Lab Results  Component Value Date   TSH 4.27 10/08/2022   TSH 2.510 07/15/2022   TSH 3.040 06/30/2022   FREET4 0.66 10/08/2022   FREET4 1.08 07/15/2022     No results found for: "TSI"   No components found for: "TRAB"   Lab Results  Component Value Date   CHOL 134 06/30/2022   Lab Results  Component Value Date   HDL 47 06/30/2022   Lab Results  Component Value Date   LDLCALC 76 06/30/2022   Lab Results  Component Value Date   TRIG 49 06/30/2022   No results  found for: "CHOLHDL" Lab Results  Component Value Date   CREATININE 0.77 07/15/2022   No results found for: "GFR"    Component Value Date/Time   NA 141 07/15/2022 0944   K 5.0 07/15/2022 0944   CL 105 07/15/2022 0944   CO2 23 07/15/2022 0944   GLUCOSE 81 07/15/2022 0944   GLUCOSE 97 09/27/2020 1240   BUN 10 07/15/2022 0944   CREATININE 0.77 07/15/2022 0944   CALCIUM 9.3 07/15/2022 0944   PROT 6.6 07/15/2022 0944   ALBUMIN 4.2 07/15/2022 0944   AST 20 07/15/2022 0944   ALT 18 07/15/2022 0944   ALKPHOS 71 07/15/2022 0944   BILITOT 0.3 07/15/2022 0944   GFRNONAA >60 09/27/2020 1240   GFRAA >60 05/29/2019 0530      Latest Ref Rng & Units 07/15/2022    9:44 AM 06/30/2022    2:53 PM 09/27/2020   12:40 PM  BMP  Glucose 70 - 99 mg/dL 81  90  97   BUN 6 - 20  mg/dL 10  11  12    Creatinine 0.57 - 1.00 mg/dL 1.61  0.96  0.45   BUN/Creat Ratio 9 - 23 13  12     Sodium 134 - 144 mmol/L 141  140  137   Potassium 3.5 - 5.2 mmol/L 5.0  4.4  4.0   Chloride 96 - 106 mmol/L 105  104  106   CO2 20 - 29 mmol/L 23  23  23    Calcium 8.7 - 10.2 mg/dL 9.3  9.1  9.3        Component Value Date/Time   WBC 5.4 07/15/2022 0944   WBC 6.9 03/01/2021 1205   RBC 4.39 07/15/2022 0944   RBC 4.63 03/01/2021 1205   HGB 12.5 07/15/2022 0944   HCT 37.4 07/15/2022 0944   PLT 237 06/30/2022 1453   MCV 85 07/15/2022 0944   MCH 28.5 07/15/2022 0944   MCH 28.9 03/01/2021 1205   MCHC 33.4 07/15/2022 0944   MCHC 32.8 03/01/2021 1205   RDW 12.2 07/15/2022 0944   LYMPHSABS 1.1 07/15/2022 0944   MONOABS 0.4 09/27/2020 1240   EOSABS 0.1 07/15/2022 0944   BASOSABS 0.0 07/15/2022 0944      Parts of this note may have been dictated using voice recognition software. There may be variances in spelling and vocabulary which are unintentional. Not all errors are proofread. Please notify the Thereasa Parkin if any discrepancies are noted or if the meaning of any statement is not clear.

## 2022-10-21 ENCOUNTER — Ambulatory Visit: Payer: Medicaid Other | Admitting: Cardiovascular Disease

## 2022-10-28 ENCOUNTER — Encounter: Payer: Self-pay | Admitting: Cardiovascular Disease

## 2022-10-28 ENCOUNTER — Ambulatory Visit: Payer: Medicaid Other | Admitting: Cardiovascular Disease

## 2022-10-28 VITALS — BP 129/90 | HR 82 | Ht 68.0 in | Wt 259.4 lb

## 2022-10-28 DIAGNOSIS — R0789 Other chest pain: Secondary | ICD-10-CM | POA: Diagnosis not present

## 2022-10-28 DIAGNOSIS — R9439 Abnormal result of other cardiovascular function study: Secondary | ICD-10-CM

## 2022-10-28 DIAGNOSIS — R002 Palpitations: Secondary | ICD-10-CM

## 2022-10-28 DIAGNOSIS — I340 Nonrheumatic mitral (valve) insufficiency: Secondary | ICD-10-CM

## 2022-10-28 DIAGNOSIS — G43709 Chronic migraine without aura, not intractable, without status migrainosus: Secondary | ICD-10-CM

## 2022-10-28 DIAGNOSIS — I2089 Other forms of angina pectoris: Secondary | ICD-10-CM

## 2022-10-28 MED ORDER — METOPROLOL TARTRATE 50 MG PO TABS: ORAL_TABLET | ORAL | 0 refills | Status: DC

## 2022-10-28 NOTE — Progress Notes (Signed)
Cardiology Office Note   Date:  10/28/2022   ID:  Kallia Sorto, DOB January 23, 1996, MRN [id number]  PCP:  Orson Eva, NP  Cardiologist:  Adrian Blackwater, MD      History of Present Illness: Heather Jacobs is a 27 y.o. female who presents for  Chief Complaint  Patient presents with   Follow-up    6 mo F/U    Chest Pain  This is a chronic problem. The current episode started more than 1 year ago. The onset quality is sudden.      Past Medical History:  Diagnosis Date   Ectopic pregnancy    Family history of ovarian cancer    3/22 cancer genetic testing letter sent   Ruptured right tubal ectopic pregnancy causing hemoperitoneum 07/18/2018     Past Surgical History:  Procedure Laterality Date   ARTHROSCOPIC REPAIR ACL     DILATION AND EVACUATION  06/27/2018   LAPAROSCOPIC BILATERAL SALPINGECTOMY Bilateral 03/07/2021   Procedure: LAPAROSCOPIC BILATERAL SALPINGECTOMY;  Surgeon: Nadara Mustard, MD;  Location: [facility];  Service: Gynecology;  Laterality: Bilateral;   LAPAROSCOPY N/A 07/18/2018   Procedure: LAPAROSCOPY DIAGNOSTIC,right salpingostomy;  Surgeon: Nadara Mustard, MD;  Location: [facility];  Service: Gynecology;  Laterality: N/A;  ectopic supected to be in blood loss     Current Outpatient Medications  Medication Sig Dispense Refill   butalbital-acetaminophen-caffeine (FIORICET) 50-325-40 MG tablet Take 1 tablet by mouth every 4 (four) hours as needed (has needed for migraines).     levothyroxine (SYNTHROID) 75 MCG tablet TAKE 1 TABLET BY MOUTH EVERY MORNING, 1 HOUR PRIOR TO OTHER MEDS AND FOODS 90 tablet 1   metoprolol tartrate (LOPRESSOR) 50 MG tablet Take 1 tab night before and 2 tab 90 minutes prior to ccta 3 tablet 0   nortriptyline (PAMELOR) 10 MG capsule 10 mg at bedtime.     venlafaxine XR (EFFEXOR-XR) 75 MG 24 hr capsule Take 75 mg by mouth daily with breakfast.     rizatriptan (MAXALT-MLT) 10 MG disintegrating tablet Take 1 tablet (10 mg total) by  mouth as needed for migraine. May repeat in 2 hours if needed (Patient not taking: Reported on 10/14/2022) 12 tablet 11   No current facility-administered medications for this visit.    Allergies:   Chlorhexidine and Clindamycin/lincomycin    Social History:   reports that she has never smoked. She has never used smokeless tobacco. She reports that she does not drink alcohol and does not use drugs.   Family History:  family history includes Diabetes Mellitus I in her father; Hashimoto's thyroiditis in her mother; Heart defect in her father; Hypothyroidism in her mother; Ovarian cancer (age of onset: 25) in her paternal aunt.    ROS:     Review of Systems  Constitutional: Negative.   HENT: Negative.    Eyes: Negative.   Respiratory: Negative.    Cardiovascular:  Positive for chest pain.  Gastrointestinal: Negative.   Genitourinary: Negative.   Musculoskeletal: Negative.   Skin: Negative.   Neurological: Negative.   Endo/Heme/Allergies: Negative.   Psychiatric/Behavioral: Negative.    All other systems reviewed and are negative.     All other systems are reviewed and negative.    PHYSICAL EXAM: VS:  BP (!) 129/90   Pulse 82   Ht 5\' 8"  (1.727 m)   Wt 259 lb 6.4 oz (117.7 kg)   SpO2 98%   BMI 39.44 kg/m  , BMI Body mass index is 39.44 kg/m. Last weight:  Wt Readings from Last 3 Encounters:  10/28/22 259 lb 6.4 oz (117.7 kg)  10/14/22 261 lb 3.2 oz (118.5 kg)  08/19/22 266 lb 9.6 oz (120.9 kg)     Physical Exam Constitutional:      Appearance: Normal appearance.  Cardiovascular:     Rate and Rhythm: Normal rate and regular rhythm.     Heart sounds: Normal heart sounds.  Pulmonary:     Effort: Pulmonary effort is normal.     Breath sounds: Normal breath sounds.  Musculoskeletal:     Right lower leg: No edema.     Left lower leg: No edema.  Neurological:     Mental Status: She is alert.       EKG:   Recent Labs: 06/30/2022: Platelets 237 07/15/2022: ALT  18; BUN 10; Creatinine, Ser 0.77; Hemoglobin 12.5; Potassium 5.0; Sodium 141 10/08/2022: TSH 4.27    Lipid Panel    Component Value Date/Time   CHOL 134 06/30/2022 1453   TRIG 49 06/30/2022 1453   HDL 47 06/30/2022 1453   LDLCALC 76 06/30/2022 1453      TESTS                                                                                          [facility] [address] [place], [place] [address] [phone] STUDY:  Gated Stress / Rest Myocardial Perfusion Imaging Tomographic (SPECT) Including attenuation correction Wall Motion, Left Ventricular Ejection Fraction By Gated Technique.Treadmill Stress Test. SEX: Female  WEIGHT: 293 lbs   HEIGHT: 68 in      ARMS UP: YES/NO                                                                        REFERRING PHYSICIAN: Dr.Jorge Amparo Welton Flakes  INDICATION FOR STUDY: CP                                                                                                                                                                                                                   TECHNIQUE:  Approximately 20 minutes following the intravenous administration of 10.5 mCi of Tc-59m Sestamibi after stress testing in a reclined supine position with arms above their head if able to do so, gated SPECT imaging of the heart was performed. After about a 2hr break, the patient was injected intravenously with 32.8 mCi of Tc-33m Sestamibi.  Approximately 45 minutes later in the same position as stress imaging SPECT rest imaging of the heart was performed.  STRESS BY:  Adrian Blackwater, MD PROTOCOL: Smitty Cords                                                                                        MAX PRED HR: 193                     85%: 164                75%: 145                                                                                                                   RESTING BP: 118/70  RESTING HR: 101 PEAK BP: 144/78   PEAK HR: 171 (88%)  EXERCISE DURATION: 8:00                                             METS: 10.2     REASON FOR TEST TERMINATION: Target reached/1 min post injection                                                                                                                                  SYMPTOMS: Fatigue  [facility] TREADMILL SCORE:  8                                      RISK: Low                                                                                                                                                                                                            EKG RESULTS: Baseline. Sinus tachycardia 101/min. No significant ST changes at peak exercise.                                                              IMAGE QUALITY: Good  PERFUSION/WALL MOTION FINDINGS: EF = 76%. Small mild reversible basal and mid anterior wall defects, normal wall motion.                                                                           IMPRESSION: Equivocal stress test with normal LVEF, advise CCTA.                                                                                                                                                                                                                                                                                         Adrian Blackwater, MD Stress Interpreting Physician / Nuclear  Interpreting Physician                         Adrian Blackwater MD  Electronically signed by: Adrian Blackwater     Date: 11/25/2021 11:32 REASON FOR VISIT  Visit for: Echocardiogram/R07.9  Sex:   Female  wt= 293   lbs.  BP=118/66  Height= 68   inches.        TESTS  Imaging: Echocardiogram:  An echocardiogram in (2-d) mode was performed and in Doppler mode with color flow velocity mapping was performed. The aortic valve cusps are abnormal 1.8  cm, flow velocity 1.34    m/s, and systolic calculated mean flow gradient 4  mmHg. Mitral valve diastolic peak flow velocity E .82     m/s and E/A ratio 1.4. Aortic root diameter 2.8   cm. The LVOT internal diameter 2.9  cm and flow velocity was abnormal 1.11   m/s. LV systolic dimension 3.15  cm, diastolic 4.42  cm, posterior wall thickness 1.4    cm, fractional shortening 28.7 %, and EF 55.5 %. IVS thickness 0.902  cm. LA dimension 4.8  cm. Mitral Valve has Trace Regurgitation. Tricuspid Valve has Trace Regurgitation.  ASSESSMENT  Technically adequate study.  Normal chamber sizes.  Normal left ventricular systolic function..  Normal right ventricular systolic function.  Normal right ventricular diastolic function.  Normal left ventricular wall motion.  Normal right ventricular wall motion.  Trace tricuspid regurgitation.  Normal pulmonary artery pressure.  Trace mitral regurgitation.  No pericardial effusion.  Mild LVH.     THERAPY   Referring physician: Laurier Nancy  Sonographer: Adrian Blackwater.      Adrian Blackwater MD  Electronically signed by: Adrian Blackwater     Date: 11/18/2021 14:36 Other studies Reviewed: Additional studies/ records that were reviewed today include:  Review of the above records demonstrates:      01/26/2019    8:41 AM  PAD Screen  Previous PAD dx? No  Previous surgical procedure? No  Pain with walking? No  Feet/toe relief with dangling? No  Painful, non-healing ulcers? No  Extremities  discolored? No      ASSESSMENT AND PLAN:    ICD-10-CM   1. Chronic migraine w/o aura w/o status migrainosus, not intractable  G43.709 CT CORONARY MORPH W/CTA COR W/SCORE W/CA W/CM &/OR WO/CM    metoprolol tartrate (LOPRESSOR) 50 MG tablet    Basic metabolic panel    2. Palpitations  R00.2 CT CORONARY MORPH W/CTA COR W/SCORE W/CA W/CM &/OR WO/CM    metoprolol tartrate (LOPRESSOR) 50 MG tablet    Basic metabolic panel    3. Other chest pain  R07.89 CT CORONARY MORPH W/CTA COR W/SCORE W/CA W/CM &/OR WO/CM    metoprolol tartrate (LOPRESSOR) 50 MG tablet    Basic metabolic panel    4. Nonrheumatic mitral valve regurgitation  I34.0 CT CORONARY MORPH W/CTA COR W/SCORE W/CA W/CM &/OR WO/CM    metoprolol tartrate (LOPRESSOR) 50 MG tablet    Basic metabolic panel   Trace    5. Stable angina pectoris  I20.89 CT CORONARY MORPH W/CTA COR W/SCORE W/CA W/CM &/OR WO/CM    metoprolol tartrate (LOPRESSOR) 50 MG tablet    Basic metabolic panel    6. Abnormal nuclear stress test  R94.39 CT CORONARY MORPH W/CTA COR W/SCORE W/CA W/CM &/OR WO/CM    metoprolol tartrate (LOPRESSOR) 50 MG tablet    Basic metabolic panel   Has recurrrent chest pain with stress test being Equivacal, advise CCTA       Problem List Items Addressed This Visit       Cardiovascular and Mediastinum   Chronic migraine w/o aura w/o status migrainosus, not intractable - Primary   Relevant Medications   metoprolol tartrate (LOPRESSOR) 50 MG tablet   Other Relevant Orders   CT CORONARY MORPH W/CTA COR W/SCORE W/CA W/CM &/OR WO/CM   Basic metabolic panel     Other   Palpitations   Relevant Medications   metoprolol tartrate (LOPRESSOR) 50 MG tablet   Other Relevant Orders   CT CORONARY MORPH W/CTA COR W/SCORE W/CA W/CM &/OR WO/CM   Basic metabolic panel   Other Visit Diagnoses     Other chest pain       Relevant Medications   metoprolol tartrate (LOPRESSOR) 50 MG tablet   Other Relevant Orders   CT CORONARY MORPH  W/CTA COR W/SCORE W/CA W/CM &/OR WO/CM   Basic metabolic panel   Nonrheumatic mitral valve regurgitation       Trace   Relevant Medications   metoprolol tartrate (LOPRESSOR) 50 MG tablet   Other Relevant Orders   CT CORONARY MORPH W/CTA COR W/SCORE W/CA W/CM &/OR WO/CM  Basic metabolic panel   Stable angina pectoris       Relevant Medications   metoprolol tartrate (LOPRESSOR) 50 MG tablet   Other Relevant Orders   CT CORONARY MORPH W/CTA COR W/SCORE W/CA W/CM &/OR WO/CM   Basic metabolic panel   Abnormal nuclear stress test       Has recurrrent chest pain with stress test being Equivacal, advise CCTA   Relevant Medications   metoprolol tartrate (LOPRESSOR) 50 MG tablet   Other Relevant Orders   CT CORONARY MORPH W/CTA COR W/SCORE W/CA W/CM &/OR WO/CM   Basic metabolic panel          Disposition:   Return in about 2 weeks (around 11/11/2022) for ccta and f/u.    Total time spent: 30 minutes  Signed,  Adrian Blackwater, MD  10/28/2022 9:53 AM    [facility]

## 2022-10-29 LAB — BASIC METABOLIC PANEL
BUN/Creatinine Ratio: 10 (ref 9–23)
BUN: 8 mg/dL (ref 6–20)
CO2: 25 mmol/L (ref 20–29)
Calcium: 9.5 mg/dL (ref 8.7–10.2)
Chloride: 102 mmol/L (ref 96–106)
Creatinine, Ser: 0.8 mg/dL (ref 0.57–1.00)
Glucose: 92 mg/dL (ref 70–99)
Potassium: 5.2 mmol/L (ref 3.5–5.2)
Sodium: 140 mmol/L (ref 134–144)
eGFR: 103 mL/min/{1.73_m2} (ref >59)

## 2022-11-11 ENCOUNTER — Ambulatory Visit: Payer: Medicaid Other

## 2022-11-11 DIAGNOSIS — R9439 Abnormal result of other cardiovascular function study: Secondary | ICD-10-CM

## 2022-11-11 DIAGNOSIS — I2089 Other forms of angina pectoris: Secondary | ICD-10-CM

## 2022-11-11 DIAGNOSIS — I340 Nonrheumatic mitral (valve) insufficiency: Secondary | ICD-10-CM | POA: Diagnosis not present

## 2022-11-11 DIAGNOSIS — R072 Precordial pain: Secondary | ICD-10-CM | POA: Diagnosis not present

## 2022-11-11 DIAGNOSIS — G43709 Chronic migraine without aura, not intractable, without status migrainosus: Secondary | ICD-10-CM

## 2022-11-11 DIAGNOSIS — R0789 Other chest pain: Secondary | ICD-10-CM

## 2022-11-11 DIAGNOSIS — R9430 Abnormal result of cardiovascular function study, unspecified: Secondary | ICD-10-CM | POA: Diagnosis not present

## 2022-11-11 DIAGNOSIS — R002 Palpitations: Secondary | ICD-10-CM

## 2022-11-11 MED ORDER — IOHEXOL 350 MG/ML SOLN
100.0000 mL | Freq: Once | INTRAVENOUS | Status: AC | PRN
  Administered 2022-11-11: 100 mL via INTRAVENOUS

## 2022-11-18 ENCOUNTER — Ambulatory Visit: Payer: Medicaid Other | Admitting: Nurse Practitioner

## 2022-11-18 ENCOUNTER — Ambulatory Visit: Payer: Medicaid Other | Admitting: Cardiovascular Disease

## 2022-12-01 ENCOUNTER — Other Ambulatory Visit: Payer: Medicaid Other

## 2022-12-01 DIAGNOSIS — E041 Nontoxic single thyroid nodule: Secondary | ICD-10-CM

## 2022-12-01 DIAGNOSIS — R5383 Other fatigue: Secondary | ICD-10-CM

## 2022-12-01 DIAGNOSIS — I10 Essential (primary) hypertension: Secondary | ICD-10-CM

## 2022-12-01 DIAGNOSIS — E785 Hyperlipidemia, unspecified: Secondary | ICD-10-CM

## 2022-12-03 ENCOUNTER — Ambulatory Visit: Payer: Medicaid Other | Admitting: Family

## 2022-12-03 VITALS — BP 110/79 | HR 84 | Ht 68.0 in | Wt 262.6 lb

## 2022-12-03 DIAGNOSIS — E039 Hypothyroidism, unspecified: Secondary | ICD-10-CM | POA: Diagnosis not present

## 2022-12-03 DIAGNOSIS — N6452 Nipple discharge: Secondary | ICD-10-CM

## 2022-12-03 NOTE — Progress Notes (Signed)
Established Patient Office Visit  Subjective:  Patient ID: Heather Jacobs, female    DOB: July 10, 1995  Age: 27 y.o. MRN: [id number]  Chief Complaint  Patient presents with   Follow-up     3 mo    Patient is here today for her 3 months follow up.  She has been feeling well since last appointment.   She does not have additional concerns to discuss today.  Labs were done prior to her appointment, so we will review in detail today. She needs refills.   I have reviewed her active problem list, medication list, allergies, notes from last encounter, lab results for her appointment today.    No other concerns at this time.   Past Medical History:  Diagnosis Date   Ectopic pregnancy    Family history of ovarian cancer    3/22 cancer genetic testing letter sent   Ruptured right tubal ectopic pregnancy causing hemoperitoneum 07/18/2018   Supervision of normal first pregnancy, antepartum 10/10/2016   RUBELLA non immune, repeat @ 28 weeks      Past Surgical History:  Procedure Laterality Date   ARTHROSCOPIC REPAIR ACL     DILATION AND EVACUATION  06/27/2018   LAPAROSCOPIC BILATERAL SALPINGECTOMY Bilateral 03/07/2021   Procedure: LAPAROSCOPIC BILATERAL SALPINGECTOMY;  Surgeon: Nadara Mustard, MD;  Location: [facility];  Service: Gynecology;  Laterality: Bilateral;   LAPAROSCOPY N/A 07/18/2018   Procedure: LAPAROSCOPY DIAGNOSTIC,right salpingostomy;  Surgeon: Nadara Mustard, MD;  Location: [facility];  Service: Gynecology;  Laterality: N/A;  ectopic supected to be in blood loss    Social History   Socioeconomic History   Marital status: Married    Spouse name: Nature conservation officer   Number of children: 2   Years of education: Not on file   Highest education level: Not on file  Occupational History   Not on file  Tobacco Use   Smoking status: Never   Smokeless tobacco: Never  Vaping Use   Vaping status: Never Used  Substance and Sexual Activity   Alcohol use: Never   Drug use: Never    Sexual activity: Yes    Birth control/protection: I.U.D.  Other Topics Concern   Not on file  Social History Narrative   Not on file   Social Determinants of Health   Financial Resource Strain: Not on file  Food Insecurity: Not on file  Transportation Needs: Not on file  Physical Activity: Not on file  Stress: Not on file  Social Connections: Not on file  Intimate Partner Violence: Not on file    Family History  Problem Relation Age of Onset   Hypothyroidism Mother    Hashimoto's thyroiditis Mother    Diabetes Mellitus I Father    Heart defect Father        Aortic stenosis (born with it)   Ovarian cancer Paternal Aunt 43    Allergies  Allergen Reactions   Chlorhexidine Hives and Other (See Comments)    States she's allergic   Clindamycin/Lincomycin Rash    Review of Systems  All other systems reviewed and are negative.      Objective:   BP 110/79   Pulse 84   Ht 5\' 8"  (1.727 m)   Wt 262 lb 9.6 oz (119.1 kg)   SpO2 98%   BMI 39.93 kg/m   Vitals:   12/03/22 1045  BP: 110/79  Pulse: 84  Height: 5\' 8"  (1.727 m)  Weight: 262 lb 9.6 oz (119.1 kg)  SpO2: 98%  BMI (Calculated):  39.94    Physical Exam Vitals and nursing note reviewed.  Constitutional:      Appearance: Normal appearance. She is normal weight.  HENT:     Head: Normocephalic.  Eyes:     Extraocular Movements: Extraocular movements intact.     Conjunctiva/sclera: Conjunctivae normal.     Pupils: Pupils are equal, round, and reactive to light.  Cardiovascular:     Rate and Rhythm: Normal rate.  Pulmonary:     Effort: Pulmonary effort is normal.  Neurological:     General: No focal deficit present.     Mental Status: She is alert and oriented to person, place, and time. Mental status is at baseline.  Psychiatric:        Mood and Affect: Mood normal.        Behavior: Behavior normal.        Thought Content: Thought content normal.        Judgment: Judgment normal.      No  results found for any visits on 12/03/22.  Recent Results (from the past 2160 hour(s))  T4, free     Status: None   Collection Time: 10/08/22 10:58 AM  Result Value Ref Range   Free T4 0.66 0.60 - 1.60 ng/dL    Comment: Specimens from patients who are undergoing biotin therapy and /or ingesting biotin supplements may contain high levels of biotin.  The higher biotin concentration in these specimens interferes with this Free T4 assay.  Specimens that contain high levels  of biotin may cause false high results for this Free T4 assay.  Please interpret results in light of the total clinical presentation of the patient.    TSH     Status: None   Collection Time: 10/08/22 10:58 AM  Result Value Ref Range   TSH 4.27 0.35 - 5.50 uIU/mL  Basic metabolic panel     Status: None   Collection Time: 10/28/22 10:14 AM  Result Value Ref Range   Glucose 92 70 - 99 mg/dL   BUN 8 6 - 20 mg/dL   Creatinine, Ser 1.61 0.57 - 1.00 mg/dL   eGFR 096 >04 VW/UJW/1.19   BUN/Creatinine Ratio 10 9 - 23   Sodium 140 134 - 144 mmol/L   Potassium 5.2 3.5 - 5.2 mmol/L   Chloride 102 96 - 106 mmol/L   CO2 25 20 - 29 mmol/L   Calcium 9.5 8.7 - 10.2 mg/dL  Hemoglobin J4N     Status: None   Collection Time: 12/01/22  1:58 PM  Result Value Ref Range   Hgb A1c MFr Bld 5.2 4.8 - 5.6 %    Comment:          Prediabetes: 5.7 - 6.4          Diabetes: >6.4          Glycemic control for adults with diabetes: <7.0    Est. average glucose Bld gHb Est-mCnc 103 mg/dL  TSH     Status: None   Collection Time: 12/01/22  1:58 PM  Result Value Ref Range   TSH 4.420 0.450 - 4.500 uIU/mL  CMP14+EGFR     Status: Abnormal   Collection Time: 12/01/22  1:58 PM  Result Value Ref Range   Glucose 89 70 - 99 mg/dL   BUN 7 6 - 20 mg/dL   Creatinine, Ser 8.29 0.57 - 1.00 mg/dL   eGFR 93 >56 OZ/HYQ/6.57   BUN/Creatinine Ratio 8 (L) 9 - 23   Sodium 139 134 -  144 mmol/L   Potassium 4.5 3.5 - 5.2 mmol/L   Chloride 102 96 - 106 mmol/L    CO2 21 20 - 29 mmol/L   Calcium 9.4 8.7 - 10.2 mg/dL   Total Protein 7.1 6.0 - 8.5 g/dL   Albumin 4.1 4.0 - 5.0 g/dL   Globulin, Total 3.0 1.5 - 4.5 g/dL   Bilirubin Total 0.3 0.0 - 1.2 mg/dL   Alkaline Phosphatase 85 44 - 121 IU/L   AST 19 0 - 40 IU/L   ALT 17 0 - 32 IU/L  Lipid panel     Status: None   Collection Time: 12/01/22  1:58 PM  Result Value Ref Range   Cholesterol, Total 148 100 - 199 mg/dL   Triglycerides 191 0 - 149 mg/dL   HDL 43 >47 mg/dL   VLDL Cholesterol Cal 19 5 - 40 mg/dL   LDL Chol Calc (NIH) 86 0 - 99 mg/dL   Chol/HDL Ratio 3.4 0.0 - 4.4 ratio    Comment:                                   T. Chol/HDL Ratio                                             Men  Women                               1/2 Avg.Risk  3.4    3.3                                   Avg.Risk  5.0    4.4                                2X Avg.Risk  9.6    7.1                                3X Avg.Risk 23.4   11.0   CBC with Diff     Status: None   Collection Time: 12/01/22  1:58 PM  Result Value Ref Range   WBC 6.3 3.4 - 10.8 x10E3/uL   RBC 4.29 3.77 - 5.28 x10E6/uL   Hemoglobin 12.4 11.1 - 15.9 g/dL   Hematocrit 82.9 56.2 - 46.6 %   MCV 88 79 - 97 fL   MCH 28.9 26.6 - 33.0 pg   MCHC 32.7 31.5 - 35.7 g/dL   RDW 13.0 86.5 - 78.4 %   Platelets 249 150 - 450 x10E3/uL   Neutrophils 68 Not Estab. %   Lymphs 23 Not Estab. %   Monocytes 7 Not Estab. %   Eos 1 Not Estab. %   Basos 1 Not Estab. %   Neutrophils Absolute 4.3 1.4 - 7.0 x10E3/uL   Lymphocytes Absolute 1.4 0.7 - 3.1 x10E3/uL   Monocytes Absolute 0.4 0.1 - 0.9 x10E3/uL   EOS (ABSOLUTE) 0.1 0.0 - 0.4 x10E3/uL   Basophils Absolute 0.0 0.0 - 0.2 x10E3/uL   Immature Granulocytes 0 Not Estab. %   Immature  Grans (Abs) 0.0 0.0 - 0.1 x10E3/uL  Prolactin     Status: None   Collection Time: 12/01/22  1:58 PM  Result Value Ref Range   Prolactin 15.6 4.8 - 33.4 ng/mL  Specimen status report     Status: None   Collection Time: 12/01/22   1:58 PM  Result Value Ref Range   specimen status report Comment     Comment: Written Authorization Written Authorization Written Authorization Received. Authorization received from [employer]  for [employer] on 12-03-2022 Logged by Adria Dill        Assessment & Plan:   Problem List Items Addressed This Visit       Active Problems   Hypothyroidism    Levels look good in recent lab draw.  Will have patient set up follow up and will draw again prior to that appointment.       Other Visit Diagnoses     Nipple discharge    -  Primary   Will send order for diagnostic mammo.  To follow up after imaging is done.   Relevant Orders   [place] LIMITED ULTRASOUND INCLUDING AXILLA LEFT BREAST        Return in about 3 months (around 03/05/2023) for F/U.   Total time spent: 20 minutes  Miki Kins, FNP  12/03/2022   This document may have been prepared by [facility] Voice Recognition software and as such may include unintentional dictation errors.

## 2022-12-07 ENCOUNTER — Encounter: Payer: Self-pay | Admitting: Family

## 2022-12-07 NOTE — Assessment & Plan Note (Signed)
Levels look good in recent lab draw.  Will have patient set up follow up and will draw again prior to that appointment.

## 2022-12-10 ENCOUNTER — Ambulatory Visit
Admission: RE | Admit: 2022-12-10 | Discharge: 2022-12-10 | Disposition: A | Discharge status: Home / Self Care | Payer: Medicaid Other | Source: Ambulatory Visit | Attending: Family | Admitting: Family

## 2022-12-10 DIAGNOSIS — N6452 Nipple discharge: Secondary | ICD-10-CM | POA: Diagnosis not present

## 2022-12-18 ENCOUNTER — Encounter: Payer: Self-pay | Admitting: Cardiovascular Disease

## 2022-12-18 ENCOUNTER — Ambulatory Visit: Payer: Medicaid Other | Admitting: Cardiovascular Disease

## 2022-12-18 VITALS — BP 140/90 | HR 100 | Ht 68.0 in | Wt 261.4 lb

## 2022-12-18 DIAGNOSIS — I340 Nonrheumatic mitral (valve) insufficiency: Secondary | ICD-10-CM

## 2022-12-18 DIAGNOSIS — I10 Essential (primary) hypertension: Secondary | ICD-10-CM

## 2022-12-18 DIAGNOSIS — I4711 Inappropriate sinus tachycardia, so stated: Secondary | ICD-10-CM | POA: Diagnosis not present

## 2022-12-18 DIAGNOSIS — R0789 Other chest pain: Secondary | ICD-10-CM | POA: Diagnosis not present

## 2022-12-18 DIAGNOSIS — G43709 Chronic migraine without aura, not intractable, without status migrainosus: Secondary | ICD-10-CM

## 2022-12-18 MED ORDER — METOPROLOL TARTRATE 25 MG PO TABS: 25.0000 mg | ORAL_TABLET | Freq: Two times a day (BID) | ORAL | 11 refills | Status: DC

## 2022-12-18 NOTE — Progress Notes (Signed)
Cardiology Office Note   Date:  12/18/2022   ID:  Heather Jacobs, DOB [birth year], MRN [id number]  PCP:  Miki Kins, FNP  Cardiologist:  Adrian Blackwater, MD      History of Present Illness: Heather Jacobs is a 27 y.o. female who presents for  Chief Complaint  Patient presents with   Results    Patient denies any chest pain or shortness of breath.  Patient states she is having migraine and that is why maybe the heart rate is elevated and blood pressure is elevated.      Past Medical History:  Diagnosis Date   Ectopic pregnancy    Family history of ovarian cancer    3/22 cancer genetic testing letter sent   Ruptured right tubal ectopic pregnancy causing hemoperitoneum 07/18/2018   Supervision of normal first pregnancy, antepartum 10/10/2016   RUBELLA non immune, repeat @ 28 weeks       Past Surgical History:  Procedure Laterality Date   ARTHROSCOPIC REPAIR ACL     DILATION AND EVACUATION  06/27/2018   LAPAROSCOPIC BILATERAL SALPINGECTOMY Bilateral 03/07/2021   Procedure: LAPAROSCOPIC BILATERAL SALPINGECTOMY;  Surgeon: Nadara Mustard, MD;  Location: [facility];  Service: Gynecology;  Laterality: Bilateral;   LAPAROSCOPY N/A 07/18/2018   Procedure: LAPAROSCOPY DIAGNOSTIC,right salpingostomy;  Surgeon: Nadara Mustard, MD;  Location: [facility];  Service: Gynecology;  Laterality: N/A;  ectopic supected to be in blood loss     Current Outpatient Medications  Medication Sig Dispense Refill   metoprolol tartrate (LOPRESSOR) 25 MG tablet Take 1 tablet (25 mg total) by mouth 2 (two) times daily. 60 tablet 11   butalbital-acetaminophen-caffeine (FIORICET) 50-325-40 MG tablet Take 1 tablet by mouth every 4 (four) hours as needed (has needed for migraines).     levothyroxine (SYNTHROID) 75 MCG tablet TAKE 1 TABLET BY MOUTH EVERY MORNING, 1 HOUR PRIOR TO OTHER MEDS AND FOODS 90 tablet 1   nortriptyline (PAMELOR) 10 MG capsule 10 mg at bedtime.     venlafaxine XR  (EFFEXOR-XR) 75 MG 24 hr capsule Take 75 mg by mouth daily with breakfast.     No current facility-administered medications for this visit.    Allergies:   Chlorhexidine and Clindamycin/lincomycin    Social History:   reports that she has never smoked. She has never used smokeless tobacco. She reports that she does not drink alcohol and does not use drugs.   Family History:  family history includes Diabetes Mellitus I in her father; Hashimoto's thyroiditis in her mother; Heart defect in her father; Hypothyroidism in her mother; Ovarian cancer (age of onset: 7) in her paternal aunt.    ROS:     Review of Systems  Constitutional: Negative.   HENT: Negative.    Eyes: Negative.   Respiratory: Negative.    Gastrointestinal: Negative.   Genitourinary: Negative.   Musculoskeletal: Negative.   Skin: Negative.   Neurological: Negative.   Endo/Heme/Allergies: Negative.   Psychiatric/Behavioral: Negative.    All other systems reviewed and are negative.     All other systems are reviewed and negative.    PHYSICAL EXAM: VS:  BP (!) 140/90   Pulse 100   Ht 5\' 8"  (1.727 m)   Wt 261 lb 6.4 oz (118.6 kg)   SpO2 98%   BMI 39.75 kg/m  , BMI Body mass index is 39.75 kg/m. Last weight:  Wt Readings from Last 3 Encounters:  12/18/22 261 lb 6.4 oz (118.6 kg)  12/03/22 262 lb 9.6  oz (119.1 kg)  10/28/22 259 lb 6.4 oz (117.7 kg)     Physical Exam Constitutional:      Appearance: Normal appearance.  Cardiovascular:     Rate and Rhythm: Normal rate and regular rhythm.     Heart sounds: Normal heart sounds.  Pulmonary:     Effort: Pulmonary effort is normal.     Breath sounds: Normal breath sounds.  Musculoskeletal:     Right lower leg: No edema.     Left lower leg: No edema.  Neurological:     Mental Status: She is alert.       EKG:   Recent Labs: 12/01/2022: ALT 17; BUN 7; Creatinine, Ser 0.87; Hemoglobin 12.4; Platelets 249; Potassium 4.5; Sodium 139; TSH 4.420     Lipid Panel    Component Value Date/Time   CHOL 148 12/01/2022 1358   TRIG 102 12/01/2022 1358   HDL 43 12/01/2022 1358   CHOLHDL 3.4 12/01/2022 1358   LDLCALC 86 12/01/2022 1358      Other studies Reviewed: Additional studies/ records that were reviewed today include:  Review of the above records demonstrates:      01/26/2019    8:41 AM  PAD Screen  Previous PAD dx? No  Previous surgical procedure? No  Pain with walking? No  Feet/toe relief with dangling? No  Painful, non-healing ulcers? No  Extremities discolored? No      ASSESSMENT AND PLAN:    ICD-10-CM   1. Other chest pain  R07.89 metoprolol tartrate (LOPRESSOR) 25 MG tablet   Patient chest pain has resolved and had normal coronaries with calcium score 0.  Thus noncardiac chest pain    2. Nonrheumatic mitral valve regurgitation  I34.0     3. Chronic migraine w/o aura w/o status migrainosus, not intractable  G43.709     4. 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    5. Primary hypertension  I10    Repeat blood pressure was 120/70 thus no need to add any medication.       Problem List Items Addressed This Visit       Cardiovascular and Mediastinum   Chronic migraine w/o aura w/o status migrainosus, not intractable   Relevant Medications   metoprolol tartrate (LOPRESSOR) 25 MG tablet   Other Visit Diagnoses     Other chest pain    -  Primary   Patient chest pain has resolved and had normal coronaries with calcium score 0.  Thus noncardiac chest pain   Relevant Medications   metoprolol tartrate (LOPRESSOR) 25 MG tablet   Nonrheumatic mitral valve regurgitation       Relevant Medications   metoprolol tartrate (LOPRESSOR) 25 MG tablet   Inappropriate sinus tachycardia       Heart rate is 100, advised starting the patient on metoprolol 25 mg once a day.   Relevant Medications   metoprolol tartrate (LOPRESSOR) 25 MG  tablet   Primary hypertension       Repeat blood pressure was 120/70 thus no need to add any medication.   Relevant Medications   metoprolol tartrate (LOPRESSOR) 25 MG tablet          Disposition:   Return in about 3 months (around 03/20/2023).    Total time spent: 40 minutes  Signed,  Adrian Blackwater, MD  12/18/2022 11:35 AM    [facility]

## 2023-02-10 ENCOUNTER — Other Ambulatory Visit (INDEPENDENT_AMBULATORY_CARE_PROVIDER_SITE_OTHER): Payer: Medicaid Other

## 2023-02-10 DIAGNOSIS — E063 Autoimmune thyroiditis: Secondary | ICD-10-CM

## 2023-02-11 LAB — TSH RFX ON ABNORMAL TO FREE T4: TSH: 3.7 u[IU]/mL (ref 0.450–4.500)

## 2023-02-17 ENCOUNTER — Ambulatory Visit: Payer: Medicaid Other | Admitting: "Endocrinology

## 2023-03-02 ENCOUNTER — Other Ambulatory Visit: Payer: Medicaid Other

## 2023-03-02 DIAGNOSIS — E041 Nontoxic single thyroid nodule: Secondary | ICD-10-CM

## 2023-03-02 DIAGNOSIS — I10 Essential (primary) hypertension: Secondary | ICD-10-CM

## 2023-03-02 DIAGNOSIS — R7301 Impaired fasting glucose: Secondary | ICD-10-CM

## 2023-03-02 DIAGNOSIS — R5383 Other fatigue: Secondary | ICD-10-CM

## 2023-03-03 LAB — CMP14+EGFR
ALT: 19 [IU]/L (ref 0–32)
AST: 17 [IU]/L (ref 0–40)
Albumin: 4.2 g/dL (ref 4.0–5.0)
Alkaline Phosphatase: 79 [IU]/L (ref 44–121)
BUN/Creatinine Ratio: 11 (ref 9–23)
BUN: 9 mg/dL (ref 6–20)
Bilirubin Total: 0.3 mg/dL (ref 0.0–1.2)
CO2: 23 mmol/L (ref 20–29)
Calcium: 9 mg/dL (ref 8.7–10.2)
Chloride: 101 mmol/L (ref 96–106)
Creatinine, Ser: 0.81 mg/dL (ref 0.57–1.00)
Globulin, Total: 2.6 g/dL (ref 1.5–4.5)
Glucose: 77 mg/dL (ref 70–99)
Potassium: 4.6 mmol/L (ref 3.5–5.2)
Sodium: 137 mmol/L (ref 134–144)
Total Protein: 6.8 g/dL (ref 6.0–8.5)
eGFR: 101 mL/min/{1.73_m2} (ref >59)

## 2023-03-03 LAB — LIPID PANEL
Chol/HDL Ratio: 3.2 ratio (ref 0.0–4.4)
Cholesterol, Total: 134 mg/dL (ref 100–199)
HDL: 42 mg/dL (ref >39)
LDL Chol Calc (NIH): 79 mg/dL (ref 0–99)
Triglycerides: 65 mg/dL (ref 0–149)
VLDL Cholesterol Cal: 13 mg/dL (ref 5–40)

## 2023-03-03 LAB — HEMOGLOBIN A1C
Est. average glucose Bld gHb Est-mCnc: 103 mg/dL
Hgb A1c MFr Bld: 5.2 % (ref 4.8–5.6)

## 2023-03-03 LAB — TSH: TSH: 2.64 u[IU]/mL (ref 0.450–4.500)

## 2023-03-04 ENCOUNTER — Ambulatory Visit: Payer: Medicaid Other | Admitting: Family

## 2023-03-04 ENCOUNTER — Encounter: Payer: Self-pay | Admitting: Family

## 2023-03-04 VITALS — BP 133/74 | HR 87 | Ht 68.0 in | Wt 268.8 lb

## 2023-03-04 DIAGNOSIS — R002 Palpitations: Secondary | ICD-10-CM | POA: Diagnosis not present

## 2023-03-04 DIAGNOSIS — R0789 Other chest pain: Secondary | ICD-10-CM

## 2023-03-04 DIAGNOSIS — Z0130 Encounter for examination of blood pressure without abnormal findings: Secondary | ICD-10-CM

## 2023-03-04 NOTE — Progress Notes (Signed)
Established Patient Office Visit  Subjective:  Patient ID: Heather Jacobs, female    DOB: 12/22/1995  Age: 27 y.o. MRN: [id number]  Chief Complaint  Patient presents with   Follow-up    Patient is here today for her 3 months follow up.  She has been feeling fairly well since last appointment.   She does have additional concerns to discuss today.  She is still having chest pains, dizziness, etc.  She says that a friend who is an EMT thinks her symptoms might be similar to POTS.   Labs were done recently, so we will review these in detail today. She needs refills.   I have reviewed her active problem list, medication list, allergies, notes from last encounter, lab results for her appointment today.      No other concerns at this time.   Past Medical History:  Diagnosis Date   Ectopic pregnancy    Family history of ovarian cancer    3/22 cancer genetic testing letter sent   Ruptured right tubal ectopic pregnancy causing hemoperitoneum 07/18/2018   Supervision of normal first pregnancy, antepartum 10/10/2016   RUBELLA non immune, repeat @ 28 weeks      Past Surgical History:  Procedure Laterality Date   ARTHROSCOPIC REPAIR ACL     DILATION AND EVACUATION  06/27/2018   LAPAROSCOPIC BILATERAL SALPINGECTOMY Bilateral 03/07/2021   Procedure: LAPAROSCOPIC BILATERAL SALPINGECTOMY;  Surgeon: Nadara Mustard, MD;  Location: [facility];  Service: Gynecology;  Laterality: Bilateral;   LAPAROSCOPY N/A 07/18/2018   Procedure: LAPAROSCOPY DIAGNOSTIC,right salpingostomy;  Surgeon: Nadara Mustard, MD;  Location: [facility];  Service: Gynecology;  Laterality: N/A;  ectopic supected to be in blood loss    Social History   Socioeconomic History   Marital status: Married    Spouse name: Nature conservation officer   Number of children: 2   Years of education: Not on file   Highest education level: Not on file  Occupational History   Not on file  Tobacco Use   Smoking status: Never   Smokeless  tobacco: Never  Vaping Use   Vaping status: Never Used  Substance and Sexual Activity   Alcohol use: Never   Drug use: Never   Sexual activity: Yes    Birth control/protection: I.U.D.  Other Topics Concern   Not on file  Social History Narrative   Not on file   Social Determinants of Health   Financial Resource Strain: Not on file  Food Insecurity: Not on file  Transportation Needs: Not on file  Physical Activity: Not on file  Stress: Not on file  Social Connections: Not on file  Intimate Partner Violence: Not on file    Family History  Problem Relation Age of Onset   Hypothyroidism Mother    Hashimoto's thyroiditis Mother    Diabetes Mellitus I Father    Heart defect Father        Aortic stenosis (born with it)   Ovarian cancer Paternal Aunt 62    Allergies  Allergen Reactions   Chlorhexidine Hives and Other (See Comments)    States she's allergic   Clindamycin/Lincomycin Rash    Review of Systems  Cardiovascular:  Positive for chest pain and palpitations.  Neurological:  Positive for dizziness.  All other systems reviewed and are negative.      Objective:   BP 133/74   Pulse 87   Ht 5\' 8"  (1.727 m)   Wt 268 lb 12.8 oz (121.9 kg)   SpO2 98%  BMI 40.87 kg/m   Vitals:   03/04/23 0942  BP: 133/74  Pulse: 87  Height: 5\' 8"  (1.727 m)  Weight: 268 lb 12.8 oz (121.9 kg)  SpO2: 98%  BMI (Calculated): 40.88    Physical Exam Vitals and nursing note reviewed.  Constitutional:      Appearance: Normal appearance. She is normal weight.  HENT:     Head: Normocephalic.  Eyes:     Extraocular Movements: Extraocular movements intact.     Conjunctiva/sclera: Conjunctivae normal.     Pupils: Pupils are equal, round, and reactive to light.  Cardiovascular:     Rate and Rhythm: Normal rate.  Pulmonary:     Effort: Pulmonary effort is normal.  Neurological:     General: No focal deficit present.     Mental Status: She is alert and oriented to person,  place, and time. Mental status is at baseline.  Psychiatric:        Mood and Affect: Mood normal.        Behavior: Behavior normal.        Thought Content: Thought content normal.        Judgment: Judgment normal.      No results found for any visits on 03/04/23.  Recent Results (from the past 2160 hour(s))  TSH Rfx on Abnormal to Free T4     Status: None   Collection Time: 02/10/23  9:24 AM  Result Value Ref Range   TSH 3.700 0.450 - 4.500 uIU/mL  Hemoglobin A1c     Status: None   Collection Time: 03/02/23 11:03 AM  Result Value Ref Range   Hgb A1c MFr Bld 5.2 4.8 - 5.6 %    Comment:          Prediabetes: 5.7 - 6.4          Diabetes: >6.4          Glycemic control for adults with diabetes: <7.0    Est. average glucose Bld gHb Est-mCnc 103 mg/dL  TSH     Status: None   Collection Time: 03/02/23 11:03 AM  Result Value Ref Range   TSH 2.640 0.450 - 4.500 uIU/mL  CMP14+EGFR     Status: None   Collection Time: 03/02/23 11:03 AM  Result Value Ref Range   Glucose 77 70 - 99 mg/dL   BUN 9 6 - 20 mg/dL   Creatinine, Ser 1.61 0.57 - 1.00 mg/dL   eGFR 096 >04 VW/UJW/1.19   BUN/Creatinine Ratio 11 9 - 23   Sodium 137 134 - 144 mmol/L   Potassium 4.6 3.5 - 5.2 mmol/L   Chloride 101 96 - 106 mmol/L   CO2 23 20 - 29 mmol/L   Calcium 9.0 8.7 - 10.2 mg/dL   Total Protein 6.8 6.0 - 8.5 g/dL   Albumin 4.2 4.0 - 5.0 g/dL   Globulin, Total 2.6 1.5 - 4.5 g/dL   Bilirubin Total 0.3 0.0 - 1.2 mg/dL   Alkaline Phosphatase 79 44 - 121 IU/L   AST 17 0 - 40 IU/L   ALT 19 0 - 32 IU/L  Lipid panel     Status: None   Collection Time: 03/02/23 11:03 AM  Result Value Ref Range   Cholesterol, Total 134 100 - 199 mg/dL   Triglycerides 65 0 - 149 mg/dL   HDL 42 >14 mg/dL   VLDL Cholesterol Cal 13 5 - 40 mg/dL   LDL Chol Calc (NIH) 79 0 - 99 mg/dL   Chol/HDL Ratio  3.2 0.0 - 4.4 ratio    Comment:                                   T. Chol/HDL Ratio                                              Men  Women                               1/2 Avg.Risk  3.4    3.3                                   Avg.Risk  5.0    4.4                                2X Avg.Risk  9.6    7.1                                3X Avg.Risk 23.4   11.0        Assessment & Plan:   Problem List Items Addressed This Visit       Active Problems   Palpitations - Primary   Relevant Orders   AMBULATORY REFERRAL FOR HOLTER MONITORING   LONG TERM MONITOR-LIVE TELEMETRY (3-14 DAYS)   Atypical chest pain   Relevant Orders   AMBULATORY REFERRAL FOR HOLTER MONITORING   LONG TERM MONITOR-LIVE TELEMETRY (3-14 DAYS)    Return in about 3 months (around 06/04/2023).   Total time spent: 20 minutes  Miki Kins, FNP  03/04/2023   This document may have been prepared by [facility] Voice Recognition software and as such may include unintentional dictation errors.

## 2023-03-10 ENCOUNTER — Encounter: Payer: Self-pay | Admitting: Family

## 2023-03-10 ENCOUNTER — Ambulatory Visit: Payer: Medicaid Other | Admitting: Family

## 2023-03-10 VITALS — BP 120/80 | HR 97 | Ht 68.0 in | Wt 273.8 lb

## 2023-03-10 DIAGNOSIS — Z0130 Encounter for examination of blood pressure without abnormal findings: Secondary | ICD-10-CM

## 2023-03-10 DIAGNOSIS — M25561 Pain in right knee: Secondary | ICD-10-CM | POA: Diagnosis not present

## 2023-03-10 NOTE — Progress Notes (Signed)
Acute Office Visit  Subjective:     Patient ID: Heather Jacobs, female    DOB: 01/22/1996, 27 y.o.   MRN: [id number]  Patient is in today for  Chief Complaint  Patient presents with   Acute Visit    Pain in knee    Knee Pain  The incident occurred 5 to 7 days ago. The incident occurred at home. There was no injury mechanism. The pain is present in the right knee. The quality of the pain is described as shooting. The pain has been Fluctuating since onset. Associated symptoms include an inability to bear weight and a loss of motion. She reports no foreign bodies present. The symptoms are aggravated by movement and weight bearing. She has tried acetaminophen, elevation, ice, heat, non-weight bearing and NSAIDs for the symptoms. The treatment provided no relief.     Review of Systems  Musculoskeletal:  Positive for joint pain.  All other systems reviewed and are negative.       Objective:    BP 120/80   Pulse 97   Ht 5\' 8"  (1.727 m)   Wt 273 lb 12.8 oz (124.2 kg)   SpO2 98%   BMI 41.63 kg/m   Physical Exam Vitals and nursing note reviewed.  Constitutional:      Appearance: Normal appearance. She is normal weight.  HENT:     Head: Normocephalic.  Eyes:     Extraocular Movements: Extraocular movements intact.     Conjunctiva/sclera: Conjunctivae normal.     Pupils: Pupils are equal, round, and reactive to light.  Cardiovascular:     Rate and Rhythm: Normal rate.  Pulmonary:     Effort: Pulmonary effort is normal.  Musculoskeletal:     Right knee: Decreased range of motion. Tenderness present.  Neurological:     General: No focal deficit present.     Mental Status: She is alert and oriented to person, place, and time. Mental status is at baseline.  Psychiatric:        Mood and Affect: Mood normal.        Behavior: Behavior normal.        Thought Content: Thought content normal.        Judgment: Judgment normal.     No results found for any visits on  03/10/23.  Recent Results (from the past 2160 hour(s))  TSH Rfx on Abnormal to Free T4     Status: None   Collection Time: 02/10/23  9:24 AM  Result Value Ref Range   TSH 3.700 0.450 - 4.500 uIU/mL  Hemoglobin A1c     Status: None   Collection Time: 03/02/23 11:03 AM  Result Value Ref Range   Hgb A1c MFr Bld 5.2 4.8 - 5.6 %    Comment:          Prediabetes: 5.7 - 6.4          Diabetes: >6.4          Glycemic control for adults with diabetes: <7.0    Est. average glucose Bld gHb Est-mCnc 103 mg/dL  TSH     Status: None   Collection Time: 03/02/23 11:03 AM  Result Value Ref Range   TSH 2.640 0.450 - 4.500 uIU/mL  CMP14+EGFR     Status: None   Collection Time: 03/02/23 11:03 AM  Result Value Ref Range   Glucose 77 70 - 99 mg/dL   BUN 9 6 - 20 mg/dL   Creatinine, Ser 4.09 0.57 - 1.00 mg/dL  eGFR 101 >59 mL/min/1.73   BUN/Creatinine Ratio 11 9 - 23   Sodium 137 134 - 144 mmol/L   Potassium 4.6 3.5 - 5.2 mmol/L   Chloride 101 96 - 106 mmol/L   CO2 23 20 - 29 mmol/L   Calcium 9.0 8.7 - 10.2 mg/dL   Total Protein 6.8 6.0 - 8.5 g/dL   Albumin 4.2 4.0 - 5.0 g/dL   Globulin, Total 2.6 1.5 - 4.5 g/dL   Bilirubin Total 0.3 0.0 - 1.2 mg/dL   Alkaline Phosphatase 79 44 - 121 IU/L   AST 17 0 - 40 IU/L   ALT 19 0 - 32 IU/L  Lipid panel     Status: None   Collection Time: 03/02/23 11:03 AM  Result Value Ref Range   Cholesterol, Total 134 100 - 199 mg/dL   Triglycerides 65 0 - 149 mg/dL   HDL 42 >95 mg/dL   VLDL Cholesterol Cal 13 5 - 40 mg/dL   LDL Chol Calc (NIH) 79 0 - 99 mg/dL   Chol/HDL Ratio 3.2 0.0 - 4.4 ratio    Comment:                                   T. Chol/HDL Ratio                                             Men  Women                               1/2 Avg.Risk  3.4    3.3                                   Avg.Risk  5.0    4.4                                2X Avg.Risk  9.6    7.1                                3X Avg.Risk 23.4   11.0        Assessment & Plan:    Problem List Items Addressed This Visit   None Visit Diagnoses     Acute pain of right knee    -  Primary   Relevant Orders   Ambulatory referral to Physical Therapy      Sending referral for physical therapy.  Patient says that she is doing some better, but that it is still hurting, especially with weight bearing.   Return as previously scheduled unless not improving.  Total time spent: 20 minutes  Miki Kins, FNP  03/10/2023   This document may have been prepared by [facility] Voice Recognition software and as such may include unintentional dictation errors.

## 2023-03-16 ENCOUNTER — Other Ambulatory Visit: Payer: Self-pay | Admitting: Family

## 2023-03-16 MED ORDER — LEVOTHYROXINE SODIUM 75 MCG PO TABS: 75.0000 ug | ORAL_TABLET | Freq: Every day | ORAL | 1 refills | Status: DC

## 2023-03-20 ENCOUNTER — Encounter: Payer: Self-pay | Admitting: Cardiovascular Disease

## 2023-03-20 ENCOUNTER — Ambulatory Visit: Payer: Medicaid Other | Admitting: Cardiovascular Disease

## 2023-03-20 VITALS — BP 128/89 | HR 88 | Ht 68.0 in | Wt 273.2 lb

## 2023-03-20 DIAGNOSIS — I340 Nonrheumatic mitral (valve) insufficiency: Secondary | ICD-10-CM | POA: Diagnosis not present

## 2023-03-20 DIAGNOSIS — I4711 Inappropriate sinus tachycardia, so stated: Secondary | ICD-10-CM

## 2023-03-20 DIAGNOSIS — I10 Essential (primary) hypertension: Secondary | ICD-10-CM | POA: Diagnosis not present

## 2023-03-20 DIAGNOSIS — R002 Palpitations: Secondary | ICD-10-CM

## 2023-03-20 DIAGNOSIS — R0789 Other chest pain: Secondary | ICD-10-CM | POA: Diagnosis not present

## 2023-03-20 MED ORDER — PANTOPRAZOLE SODIUM 40 MG PO TBEC: 40.0000 mg | DELAYED_RELEASE_TABLET | Freq: Every day | ORAL | 1 refills | Status: DC

## 2023-03-20 NOTE — Progress Notes (Signed)
Cardiology Office Note   Date:  03/20/2023   ID:  Heather Jacobs, DOB 1995-10-04, MRN [id number]  PCP:  Miki Kins, FNP  Cardiologist:  Adrian Blackwater, MD      History of Present Illness: Heather Jacobs is a 27 y.o. female who presents for  Chief Complaint  Patient presents with   Follow-up    Wants to talk about POTS    Gets dizzy standing up, chest pain      Past Medical History:  Diagnosis Date   Ectopic pregnancy    Family history of ovarian cancer    3/22 cancer genetic testing letter sent   Ruptured right tubal ectopic pregnancy causing hemoperitoneum 07/18/2018   Supervision of normal first pregnancy, antepartum 10/10/2016   RUBELLA non immune, repeat @ 28 weeks       Past Surgical History:  Procedure Laterality Date   ARTHROSCOPIC REPAIR ACL     DILATION AND EVACUATION  06/27/2018   LAPAROSCOPIC BILATERAL SALPINGECTOMY Bilateral 03/07/2021   Procedure: LAPAROSCOPIC BILATERAL SALPINGECTOMY;  Surgeon: Nadara Mustard, MD;  Location: [facility];  Service: Gynecology;  Laterality: Bilateral;   LAPAROSCOPY N/A 07/18/2018   Procedure: LAPAROSCOPY DIAGNOSTIC,right salpingostomy;  Surgeon: Nadara Mustard, MD;  Location: [facility];  Service: Gynecology;  Laterality: N/A;  ectopic supected to be in blood loss     Current Outpatient Medications  Medication Sig Dispense Refill   butalbital-acetaminophen-caffeine (FIORICET) 50-325-40 MG tablet Take 1 tablet by mouth every 4 (four) hours as needed (has needed for migraines).     levothyroxine (SYNTHROID) 75 MCG tablet Take 1 tablet (75 mcg total) by mouth daily before breakfast. 90 tablet 1   metoprolol tartrate (LOPRESSOR) 25 MG tablet Take 1 tablet (25 mg total) by mouth 2 (two) times daily. 60 tablet 11   nortriptyline (PAMELOR) 10 MG capsule 10 mg at bedtime.     pantoprazole (PROTONIX) 40 MG tablet Take 1 tablet (40 mg total) by mouth daily. 30 tablet 1   venlafaxine XR (EFFEXOR-XR) 75 MG 24 hr capsule  Take 75 mg by mouth daily with breakfast.     No current facility-administered medications for this visit.    Allergies:   Chlorhexidine and Clindamycin/lincomycin    Social History:   reports that she has never smoked. She has never used smokeless tobacco. She reports that she does not drink alcohol and does not use drugs.   Family History:  family history includes Diabetes Mellitus I in her father; Hashimoto's thyroiditis in her mother; Heart defect in her father; Hypothyroidism in her mother; Ovarian cancer (age of onset: 23) in her paternal aunt.    ROS:     ROS    All other systems are reviewed and negative.    PHYSICAL EXAM: VS:  BP 128/89   Pulse 88   Ht 5\' 8"  (1.727 m)   Wt 273 lb 3.2 oz (123.9 kg)   SpO2 98%   BMI 41.54 kg/m  , BMI Body mass index is 41.54 kg/m. Last weight:  Wt Readings from Last 3 Encounters:  03/20/23 273 lb 3.2 oz (123.9 kg)  03/10/23 273 lb 12.8 oz (124.2 kg)  03/04/23 268 lb 12.8 oz (121.9 kg)     Physical Exam    EKG:   Recent Labs: 12/01/2022: Hemoglobin 12.4; Platelets 249 03/02/2023: ALT 19; BUN 9; Creatinine, Ser 0.81; Potassium 4.6; Sodium 137; TSH 2.640    Lipid Panel    Component Value Date/Time   CHOL 134 03/02/2023 1103  TRIG 65 03/02/2023 1103   HDL 42 03/02/2023 1103   CHOLHDL 3.2 03/02/2023 1103   LDLCALC 79 03/02/2023 1103      Other studies Reviewed: Additional studies/ records that were reviewed today include:  Review of the above records demonstrates:      01/26/2019    8:41 AM  PAD Screen  Previous PAD dx? No  Previous surgical procedure? No  Pain with walking? No  Feet/toe relief with dangling? No  Painful, non-healing ulcers? No  Extremities discolored? No      ASSESSMENT AND PLAN:    ICD-10-CM   1. Palpitations  R00.2 pantoprazole (PROTONIX) 40 MG tablet    CARDIAC EVENT MONITOR   Holter not done, will reorder it.    2. Atypical chest pain  R07.89 pantoprazole (PROTONIX) 40 MG tablet     CARDIAC EVENT MONITOR   CCTA showed normal coronaries, ca score 0.    3. Nonrheumatic mitral valve regurgitation  I34.0 pantoprazole (PROTONIX) 40 MG tablet    CARDIAC EVENT MONITOR    4. Primary hypertension  I10 pantoprazole (PROTONIX) 40 MG tablet    CARDIAC EVENT MONITOR    5. Inappropriate sinus tachycardia (HCC)  I47.11 pantoprazole (PROTONIX) 40 MG tablet    CARDIAC EVENT MONITOR   Gets dizzy, no syncope, waiting monitor, doubt its POTS       Problem List Items Addressed This Visit       Other   Palpitations - Primary   Relevant Medications   pantoprazole (PROTONIX) 40 MG tablet   Other Relevant Orders   CARDIAC EVENT MONITOR   Atypical chest pain   Relevant Medications   pantoprazole (PROTONIX) 40 MG tablet   Other Relevant Orders   CARDIAC EVENT MONITOR   Other Visit Diagnoses     Nonrheumatic mitral valve regurgitation       Relevant Medications   pantoprazole (PROTONIX) 40 MG tablet   Other Relevant Orders   CARDIAC EVENT MONITOR   Primary hypertension       Relevant Medications   pantoprazole (PROTONIX) 40 MG tablet   Other Relevant Orders   CARDIAC EVENT MONITOR   Inappropriate sinus tachycardia (HCC)       Gets dizzy, no syncope, waiting monitor, doubt its POTS   Relevant Medications   pantoprazole (PROTONIX) 40 MG tablet   Other Relevant Orders   CARDIAC EVENT MONITOR          Disposition:   Return in about 6 weeks (around 05/01/2023) for holter and f/u.    Total time spent: 30 minutes  Signed,  Adrian Blackwater, MD  03/20/2023 10:48 AM    [facility]

## 2023-04-06 ENCOUNTER — Ambulatory Visit: Payer: BC Managed Care – PPO

## 2023-04-06 DIAGNOSIS — I10 Essential (primary) hypertension: Secondary | ICD-10-CM

## 2023-04-06 DIAGNOSIS — I4711 Inappropriate sinus tachycardia, so stated: Secondary | ICD-10-CM

## 2023-04-06 DIAGNOSIS — R0789 Other chest pain: Secondary | ICD-10-CM

## 2023-04-06 DIAGNOSIS — I340 Nonrheumatic mitral (valve) insufficiency: Secondary | ICD-10-CM

## 2023-04-06 DIAGNOSIS — R002 Palpitations: Secondary | ICD-10-CM

## 2023-04-09 ENCOUNTER — Encounter: Payer: Self-pay | Admitting: Cardiovascular Disease

## 2023-04-20 DIAGNOSIS — R002 Palpitations: Secondary | ICD-10-CM | POA: Diagnosis not present

## 2023-05-01 ENCOUNTER — Ambulatory Visit (INDEPENDENT_AMBULATORY_CARE_PROVIDER_SITE_OTHER): Payer: BC Managed Care – PPO | Admitting: Cardiovascular Disease

## 2023-05-01 ENCOUNTER — Encounter: Payer: Self-pay | Admitting: Cardiovascular Disease

## 2023-05-01 VITALS — BP 128/86 | HR 89 | Ht 68.0 in | Wt 282.4 lb

## 2023-05-01 DIAGNOSIS — I10 Essential (primary) hypertension: Secondary | ICD-10-CM | POA: Diagnosis not present

## 2023-05-01 DIAGNOSIS — R0789 Other chest pain: Secondary | ICD-10-CM

## 2023-05-01 DIAGNOSIS — I340 Nonrheumatic mitral (valve) insufficiency: Secondary | ICD-10-CM

## 2023-05-01 DIAGNOSIS — R002 Palpitations: Secondary | ICD-10-CM

## 2023-05-01 DIAGNOSIS — I471 Supraventricular tachycardia, unspecified: Secondary | ICD-10-CM

## 2023-05-01 MED ORDER — FLECAINIDE ACETATE 50 MG PO TABS: 50.0000 mg | ORAL_TABLET | Freq: Two times a day (BID) | ORAL | 11 refills | Status: DC

## 2023-05-01 NOTE — Progress Notes (Signed)
 Cardiology Office Note   Date:  05/01/2023   ID:  Brad Mcgaughy, DOB [birth year], MRN [id number]  PCP:  Orlean Alan HERO, FNP  Cardiologist:  Denyse Bathe, MD      History of Present Illness: Heather Jacobs is a 28 y.o. female who presents for  Chief Complaint  Patient presents with   Follow-up    6 week follow up, Holter results.    Had chest pain and palpitation during holter recording      Past Medical History:  Diagnosis Date   Ectopic pregnancy    Family history of ovarian cancer    3/22 cancer genetic testing letter sent   Ruptured right tubal ectopic pregnancy causing hemoperitoneum 07/18/2018   Supervision of normal first pregnancy, antepartum 10/10/2016   RUBELLA non immune, repeat @ 28 weeks       Past Surgical History:  Procedure Laterality Date   ARTHROSCOPIC REPAIR ACL     DILATION AND EVACUATION  06/27/2018   LAPAROSCOPIC BILATERAL SALPINGECTOMY Bilateral 03/07/2021   Procedure: LAPAROSCOPIC BILATERAL SALPINGECTOMY;  Surgeon: Arloa Lamar SQUIBB, MD;  Location: [facility];  Service: Gynecology;  Laterality: Bilateral;   LAPAROSCOPY N/A 07/18/2018   Procedure: LAPAROSCOPY DIAGNOSTIC,right salpingostomy;  Surgeon: Arloa Lamar SQUIBB, MD;  Location: [facility];  Service: Gynecology;  Laterality: N/A;  ectopic supected to be in blood loss     Current Outpatient Medications  Medication Sig Dispense Refill   butalbital-acetaminophen -caffeine (FIORICET) 50-325-40 MG tablet Take 1 tablet by mouth every 4 (four) hours as needed (has needed for migraines).     flecainide  (TAMBOCOR ) 50 MG tablet Take 1 tablet (50 mg total) by mouth 2 (two) times daily. 60 tablet 11   levothyroxine  (SYNTHROID ) 75 MCG tablet Take 1 tablet (75 mcg total) by mouth daily before breakfast. 90 tablet 1   metoprolol  tartrate (LOPRESSOR ) 25 MG tablet Take 1 tablet (25 mg total) by mouth 2 (two) times daily. 60 tablet 11   nortriptyline (PAMELOR) 10 MG capsule 10 mg at bedtime.      pantoprazole  (PROTONIX ) 40 MG tablet Take 1 tablet (40 mg total) by mouth daily. 30 tablet 1   venlafaxine XR (EFFEXOR-XR) 75 MG 24 hr capsule Take 75 mg by mouth daily with breakfast.     No current facility-administered medications for this visit.    Allergies:   Chlorhexidine  and Clindamycin /lincomycin    Social History:   reports that she has never smoked. She has never used smokeless tobacco. She reports that she does not drink alcohol and does not use drugs.   Family History:  family history includes Diabetes Mellitus I in her father; Hashimoto's thyroiditis in her mother; Heart defect in her father; Hypothyroidism in her mother; Ovarian cancer (age of onset: 26) in her paternal aunt.    ROS:     Review of Systems  Constitutional: Negative.   HENT: Negative.    Eyes: Negative.   Respiratory: Negative.    Gastrointestinal: Negative.   Genitourinary: Negative.   Musculoskeletal: Negative.   Skin: Negative.   Neurological: Negative.   Endo/Heme/Allergies: Negative.   Psychiatric/Behavioral: Negative.    All other systems reviewed and are negative.     All other systems are reviewed and negative.    PHYSICAL EXAM: VS:  BP 128/86   Pulse 89   Ht 5' 8 (1.727 m)   Wt 282 lb 6.4 oz (128.1 kg)   SpO2 98%   BMI 42.94 kg/m  , BMI Body mass index is 42.94 kg/m.  Last weight:  Wt Readings from Last 3 Encounters:  05/01/23 282 lb 6.4 oz (128.1 kg)  03/20/23 273 lb 3.2 oz (123.9 kg)  03/10/23 273 lb 12.8 oz (124.2 kg)     Physical Exam Constitutional:      Appearance: Normal appearance.  Cardiovascular:     Rate and Rhythm: Normal rate and regular rhythm.     Heart sounds: Normal heart sounds.  Pulmonary:     Effort: Pulmonary effort is normal.     Breath sounds: Normal breath sounds.  Musculoskeletal:     Right lower leg: No edema.     Left lower leg: No edema.  Neurological:     Mental Status: She is alert.       EKG:   Recent Labs: 12/01/2022:  Hemoglobin 12.4; Platelets 249 03/02/2023: ALT 19; BUN 9; Creatinine, Ser 0.81; Potassium 4.6; Sodium 137; TSH 2.640    Lipid Panel    Component Value Date/Time   CHOL 134 03/02/2023 1103   TRIG 65 03/02/2023 1103   HDL 42 03/02/2023 1103   CHOLHDL 3.2 03/02/2023 1103   LDLCALC 79 03/02/2023 1103      Other studies Reviewed: Additional studies/ records that were reviewed today include:  Review of the above records demonstrates:      01/26/2019    8:41 AM  PAD Screen  Previous PAD dx? No  Previous surgical procedure? No  Pain with walking? No  Feet/toe relief with dangling? No  Painful, non-healing ulcers? No  Extremities discolored? No      ASSESSMENT AND PLAN:    ICD-10-CM   1. SVT (supraventricular tachycardia) (HCC)  I47.10 flecainide  (TAMBOCOR ) 50 MG tablet   Holter showed Rate 167, regular probably SVT, will start flecanide    2. Palpitations  R00.2 flecainide  (TAMBOCOR ) 50 MG tablet   start flecanide, had normal coronaries on CCTA and LVEF 65%    3. Atypical chest pain  R07.89 flecainide  (TAMBOCOR ) 50 MG tablet    4. Nonrheumatic mitral valve regurgitation  I34.0 flecainide  (TAMBOCOR ) 50 MG tablet    5. Primary hypertension  I10 flecainide  (TAMBOCOR ) 50 MG tablet       Problem List Items Addressed This Visit       Other   Palpitations   Relevant Medications   flecainide  (TAMBOCOR ) 50 MG tablet   Atypical chest pain   Relevant Medications   flecainide  (TAMBOCOR ) 50 MG tablet   Other Visit Diagnoses       SVT (supraventricular tachycardia) (HCC)    -  Primary   Holter showed Rate 167, regular probably SVT, will start flecanide   Relevant Medications   flecainide  (TAMBOCOR ) 50 MG tablet     Nonrheumatic mitral valve regurgitation       Relevant Medications   flecainide  (TAMBOCOR ) 50 MG tablet     Primary hypertension       Relevant Medications   flecainide  (TAMBOCOR ) 50 MG tablet          Disposition:   Return in about 4 weeks (around  05/29/2023).    Total time spent: 30 minutes  Signed,  Denyse Bathe, MD  05/01/2023 11:08 AM    [facility]

## 2023-05-28 ENCOUNTER — Ambulatory Visit: Payer: BC Managed Care – PPO | Admitting: Cardiovascular Disease

## 2023-06-01 ENCOUNTER — Encounter: Payer: Self-pay | Admitting: Cardiovascular Disease

## 2023-06-01 ENCOUNTER — Ambulatory Visit: Payer: BC Managed Care – PPO | Admitting: Cardiovascular Disease

## 2023-06-01 VITALS — BP 122/78 | HR 88 | Ht 68.0 in | Wt 284.0 lb

## 2023-06-01 DIAGNOSIS — I471 Supraventricular tachycardia, unspecified: Secondary | ICD-10-CM

## 2023-06-01 DIAGNOSIS — R002 Palpitations: Secondary | ICD-10-CM | POA: Diagnosis not present

## 2023-06-01 DIAGNOSIS — R0789 Other chest pain: Secondary | ICD-10-CM

## 2023-06-01 DIAGNOSIS — I10 Essential (primary) hypertension: Secondary | ICD-10-CM

## 2023-06-01 MED ORDER — FLECAINIDE ACETATE 100 MG PO TABS: 100.0000 mg | ORAL_TABLET | Freq: Two times a day (BID) | ORAL | 11 refills | Status: DC

## 2023-06-01 NOTE — Progress Notes (Signed)
Cardiology Office Note   Date:  06/01/2023   ID:  Heather Jacobs, DOB 10/16/1995, MRN [id number]  PCP:  Miki Kins, FNP  Cardiologist:  Adrian Blackwater, MD      History of Present Illness: Heather Jacobs is a 28 y.o. female who presents for  Chief Complaint  Patient presents with   Follow-up    4 weeks follow up    Chest Pain  This is a recurrent problem. The onset quality is gradual. The problem has been waxing and waning. The pain is present in the lateral region.      Past Medical History:  Diagnosis Date   Ectopic pregnancy    Family history of ovarian cancer    3/22 cancer genetic testing letter sent   Ruptured right tubal ectopic pregnancy causing hemoperitoneum 07/18/2018   Supervision of normal first pregnancy, antepartum 10/10/2016   RUBELLA non immune, repeat @ 28 weeks       Past Surgical History:  Procedure Laterality Date   ARTHROSCOPIC REPAIR ACL     DILATION AND EVACUATION  06/27/2018   LAPAROSCOPIC BILATERAL SALPINGECTOMY Bilateral 03/07/2021   Procedure: LAPAROSCOPIC BILATERAL SALPINGECTOMY;  Surgeon: Nadara Mustard, MD;  Location: [facility];  Service: Gynecology;  Laterality: Bilateral;   LAPAROSCOPY N/A 07/18/2018   Procedure: LAPAROSCOPY DIAGNOSTIC,right salpingostomy;  Surgeon: Nadara Mustard, MD;  Location: [facility];  Service: Gynecology;  Laterality: N/A;  ectopic supected to be in blood loss     Current Outpatient Medications  Medication Sig Dispense Refill   flecainide (TAMBOCOR) 100 MG tablet Take 1 tablet (100 mg total) by mouth 2 (two) times daily. 60 tablet 11   butalbital-acetaminophen-caffeine (FIORICET) 50-325-40 MG tablet Take 1 tablet by mouth every 4 (four) hours as needed (has needed for migraines).     levothyroxine (SYNTHROID) 75 MCG tablet Take 1 tablet (75 mcg total) by mouth daily before breakfast. 90 tablet 1   metoprolol tartrate (LOPRESSOR) 25 MG tablet Take 1 tablet (25 mg total) by mouth 2 (two) times daily.  60 tablet 11   nortriptyline (PAMELOR) 10 MG capsule 10 mg at bedtime.     pantoprazole (PROTONIX) 40 MG tablet Take 1 tablet (40 mg total) by mouth daily. 30 tablet 1   venlafaxine XR (EFFEXOR-XR) 75 MG 24 hr capsule Take 75 mg by mouth daily with breakfast.     No current facility-administered medications for this visit.    Allergies:   Chlorhexidine and Clindamycin/lincomycin    Social History:   reports that she has never smoked. She has never used smokeless tobacco. She reports that she does not drink alcohol and does not use drugs.   Family History:  family history includes Diabetes Mellitus I in her father; Hashimoto's thyroiditis in her mother; Heart defect in her father; Hypothyroidism in her mother; Ovarian cancer (age of onset: 70) in her paternal aunt.    ROS:     Review of Systems  Constitutional: Negative.   HENT: Negative.    Eyes: Negative.   Respiratory: Negative.    Cardiovascular:  Positive for chest pain.  Gastrointestinal: Negative.   Genitourinary: Negative.   Musculoskeletal: Negative.   Skin: Negative.   Neurological: Negative.   Endo/Heme/Allergies: Negative.   Psychiatric/Behavioral: Negative.    All other systems reviewed and are negative.     All other systems are reviewed and negative.    PHYSICAL EXAM: VS:  BP 122/78   Pulse 88   Ht 5\' 8"  (1.727 m)  Wt 284 lb (128.8 kg)   SpO2 98%   BMI 43.18 kg/m  , BMI Body mass index is 43.18 kg/m. Last weight:  Wt Readings from Last 3 Encounters:  06/01/23 284 lb (128.8 kg)  05/01/23 282 lb 6.4 oz (128.1 kg)  03/20/23 273 lb 3.2 oz (123.9 kg)     Physical Exam Constitutional:      Appearance: Normal appearance.  Cardiovascular:     Rate and Rhythm: Normal rate and regular rhythm.     Heart sounds: Normal heart sounds.  Pulmonary:     Effort: Pulmonary effort is normal.     Breath sounds: Normal breath sounds.  Musculoskeletal:     Right lower leg: No edema.     Left lower leg: No  edema.  Neurological:     Mental Status: She is alert.       EKG:   Recent Labs: 12/01/2022: Hemoglobin 12.4; Platelets 249 03/02/2023: ALT 19; BUN 9; Creatinine, Ser 0.81; Potassium 4.6; Sodium 137; TSH 2.640    Lipid Panel    Component Value Date/Time   CHOL 134 03/02/2023 1103   TRIG 65 03/02/2023 1103   HDL 42 03/02/2023 1103   CHOLHDL 3.2 03/02/2023 1103   LDLCALC 79 03/02/2023 1103      Other studies Reviewed: Additional studies/ records that were reviewed today include:  Review of the above records demonstrates:      01/26/2019    8:41 AM  PAD Screen  Previous PAD dx? No  Previous surgical procedure? No  Pain with walking? No  Feet/toe relief with dangling? No  Painful, non-healing ulcers? No  Extremities discolored? No      ASSESSMENT AND PLAN:    ICD-10-CM   1. SVT (supraventricular tachycardia) (HCC)  I47.10    Doing better, flecanide 100 bid    2. Primary hypertension  I10     3. Palpitations  R00.2     4. Atypical chest pain  R07.89        Problem List Items Addressed This Visit       Other   Palpitations   Atypical chest pain   Other Visit Diagnoses       SVT (supraventricular tachycardia) (HCC)    -  Primary   Doing better, flecanide 100 bid   Relevant Medications   flecainide (TAMBOCOR) 100 MG tablet     Primary hypertension       Relevant Medications   flecainide (TAMBOCOR) 100 MG tablet          Disposition:   Return in about 6 weeks (around 07/13/2023).    Total time spent: 30 minutes  Signed,  Adrian Blackwater, MD  06/01/2023 9:53 AM    [facility]

## 2023-06-04 ENCOUNTER — Ambulatory Visit: Payer: BC Managed Care – PPO | Admitting: Family

## 2023-06-10 ENCOUNTER — Other Ambulatory Visit: Payer: BC Managed Care – PPO

## 2023-06-10 DIAGNOSIS — E039 Hypothyroidism, unspecified: Secondary | ICD-10-CM

## 2023-06-10 DIAGNOSIS — Z13220 Encounter for screening for lipoid disorders: Secondary | ICD-10-CM

## 2023-06-10 DIAGNOSIS — I10 Essential (primary) hypertension: Secondary | ICD-10-CM

## 2023-06-10 DIAGNOSIS — Z131 Encounter for screening for diabetes mellitus: Secondary | ICD-10-CM

## 2023-06-11 ENCOUNTER — Encounter: Payer: Self-pay | Admitting: Family

## 2023-06-11 LAB — CMP14+EGFR
ALT: 21 [IU]/L (ref 0–32)
AST: 17 [IU]/L (ref 0–40)
Albumin: 4.2 g/dL (ref 4.0–5.0)
Alkaline Phosphatase: 77 [IU]/L (ref 44–121)
BUN/Creatinine Ratio: 11 (ref 9–23)
BUN: 9 mg/dL (ref 6–20)
Bilirubin Total: 0.2 mg/dL (ref 0.0–1.2)
CO2: 24 mmol/L (ref 20–29)
Calcium: 9.7 mg/dL (ref 8.7–10.2)
Chloride: 103 mmol/L (ref 96–106)
Creatinine, Ser: 0.82 mg/dL (ref 0.57–1.00)
Globulin, Total: 2.9 g/dL (ref 1.5–4.5)
Glucose: 89 mg/dL (ref 70–99)
Potassium: 4.6 mmol/L (ref 3.5–5.2)
Sodium: 139 mmol/L (ref 134–144)
Total Protein: 7.1 g/dL (ref 6.0–8.5)
eGFR: 100 mL/min/{1.73_m2} (ref >59)

## 2023-06-11 LAB — LIPID PANEL
Chol/HDL Ratio: 3.3 {ratio} (ref 0.0–4.4)
Cholesterol, Total: 141 mg/dL (ref 100–199)
HDL: 43 mg/dL (ref >39)
LDL Chol Calc (NIH): 84 mg/dL (ref 0–99)
Triglycerides: 71 mg/dL (ref 0–149)
VLDL Cholesterol Cal: 14 mg/dL (ref 5–40)

## 2023-06-11 LAB — TSH: TSH: 13 u[IU]/mL — ABNORMAL HIGH (ref 0.450–4.500)

## 2023-06-11 LAB — HEMOGLOBIN A1C
Est. average glucose Bld gHb Est-mCnc: 103 mg/dL
Hgb A1c MFr Bld: 5.2 % (ref 4.8–5.6)

## 2023-06-12 ENCOUNTER — Encounter: Payer: Self-pay | Admitting: Family

## 2023-06-12 ENCOUNTER — Ambulatory Visit (INDEPENDENT_AMBULATORY_CARE_PROVIDER_SITE_OTHER): Payer: BC Managed Care – PPO | Admitting: Family

## 2023-06-12 VITALS — BP 108/70 | HR 106 | Ht 68.0 in | Wt 289.6 lb

## 2023-06-12 DIAGNOSIS — R946 Abnormal results of thyroid function studies: Secondary | ICD-10-CM

## 2023-06-12 DIAGNOSIS — Z0130 Encounter for examination of blood pressure without abnormal findings: Secondary | ICD-10-CM

## 2023-06-12 DIAGNOSIS — Z3200 Encounter for pregnancy test, result unknown: Secondary | ICD-10-CM

## 2023-06-12 LAB — POCT URINE PREGNANCY: Preg Test, Ur: NEGATIVE

## 2023-06-12 NOTE — Assessment & Plan Note (Signed)
Continue current meds.  Will adjust as needed based on results.  The patient is asked to make an attempt to improve diet and exercise patterns to aid in medical management of this problem. Addressed importance of increasing and maintaining water intake.

## 2023-06-12 NOTE — Progress Notes (Signed)
Established Patient Office Visit  Subjective:  Patient ID: Heather Jacobs, female    DOB: [birth year]  Age: 28 y.o. MRN: [id number]  Chief Complaint  Patient presents with   Follow-up    3 month follow up    Patient is here today for her 3 months follow up.  She has been feeling fairly well since last appointment.   She does have additional concerns to discuss today.  She says that her period is 10 days late.  Last menstrual cycle was normal. She has noticed more fatigue and nausea Has taken 4 HPT, 2 + 2 -. Has had tubal ligation and has had a history of an ectopic pregnancy, so she is more at risk for an ectopic.   Labs were done earlier this week, so we will review today. She needs refills.   I have reviewed her active problem list, medication list, allergies, health maintenance, notes from last encounter, lab results for her appointment today.      No other concerns at this time.   Past Medical History:  Diagnosis Date   Ectopic pregnancy    Family history of ovarian cancer    3/22 cancer genetic testing letter sent   Ruptured right tubal ectopic pregnancy causing hemoperitoneum 07/18/2018   Supervision of normal first pregnancy, antepartum 10/10/2016   RUBELLA non immune, repeat @ 28 weeks      Past Surgical History:  Procedure Laterality Date   ARTHROSCOPIC REPAIR ACL     DILATION AND EVACUATION  06/27/2018   LAPAROSCOPIC BILATERAL SALPINGECTOMY Bilateral 03/07/2021   Procedure: LAPAROSCOPIC BILATERAL SALPINGECTOMY;  Surgeon: Nadara Mustard, MD;  Location: [facility];  Service: Gynecology;  Laterality: Bilateral;   LAPAROSCOPY N/A 07/18/2018   Procedure: LAPAROSCOPY DIAGNOSTIC,right salpingostomy;  Surgeon: Nadara Mustard, MD;  Location: [facility];  Service: Gynecology;  Laterality: N/A;  ectopic supected to be in blood loss    Social History   Socioeconomic History   Marital status: Married    Spouse name: Nature conservation officer   Number of children: 2   Years of  education: Not on file   Highest education level: Not on file  Occupational History   Not on file  Tobacco Use   Smoking status: Never   Smokeless tobacco: Never  Vaping Use   Vaping status: Never Used  Substance and Sexual Activity   Alcohol use: Never   Drug use: Never   Sexual activity: Yes    Birth control/protection: I.U.D.  Other Topics Concern   Not on file  Social History Narrative   Not on file   Social Drivers of Health   Financial Resource Strain: Not on file  Food Insecurity: Not on file  Transportation Needs: Not on file  Physical Activity: Not on file  Stress: Not on file  Social Connections: Not on file  Intimate Partner Violence: Not on file    Family History  Problem Relation Age of Onset   Hypothyroidism Mother    Hashimoto's thyroiditis Mother    Diabetes Mellitus I Father    Heart defect Father        Aortic stenosis (born with it)   Ovarian cancer Paternal Aunt 10    Allergies  Allergen Reactions   Chlorhexidine Hives and Other (See Comments)    States she's allergic   Clindamycin/Lincomycin Rash    Review of Systems  Constitutional:  Positive for malaise/fatigue.  Gastrointestinal:  Positive for nausea.  Psychiatric/Behavioral:  The patient is nervous/anxious.   All other systems  reviewed and are negative.      Objective:   BP 108/70   Pulse (!) 106   Ht 5\' 8"  (1.727 m)   Wt 289 lb 9.6 oz (131.4 kg)   SpO2 98%   BMI 44.03 kg/m   Vitals:   06/12/23 0900  BP: 108/70  Pulse: (!) 106  Height: 5\' 8"  (1.727 m)  Weight: 289 lb 9.6 oz (131.4 kg)  SpO2: 98%  BMI (Calculated): 44.04    Physical Exam Vitals and nursing note reviewed.  Constitutional:      Appearance: Normal appearance. She is normal weight.  HENT:     Head: Normocephalic and atraumatic.  Eyes:     Extraocular Movements: Extraocular movements intact.     Conjunctiva/sclera: Conjunctivae normal.     Pupils: Pupils are equal, round, and reactive to light.   Cardiovascular:     Rate and Rhythm: Normal rate.  Pulmonary:     Effort: Pulmonary effort is normal.  Neurological:     General: No focal deficit present.     Mental Status: She is alert and oriented to person, place, and time. Mental status is at baseline.  Psychiatric:        Attention and Perception: Attention and perception normal.        Mood and Affect: Affect normal. Mood is anxious.        Speech: Speech normal.        Behavior: Behavior normal. Behavior is cooperative.        Thought Content: Thought content normal.        Cognition and Memory: Cognition and memory normal.        Judgment: Judgment normal.      No results found for any visits on 06/12/23.  Recent Results (from the past 2160 hours)  CMP14+EGFR     Status: None   Collection Time: 06/10/23 10:58 AM  Result Value Ref Range   Glucose 89 70 - 99 mg/dL   BUN 9 6 - 20 mg/dL   Creatinine, Ser 1.61 0.57 - 1.00 mg/dL   eGFR 096 >04 VW/UJW/1.19   BUN/Creatinine Ratio 11 9 - 23   Sodium 139 134 - 144 mmol/L   Potassium 4.6 3.5 - 5.2 mmol/L   Chloride 103 96 - 106 mmol/L   CO2 24 20 - 29 mmol/L   Calcium 9.7 8.7 - 10.2 mg/dL   Total Protein 7.1 6.0 - 8.5 g/dL   Albumin 4.2 4.0 - 5.0 g/dL   Globulin, Total 2.9 1.5 - 4.5 g/dL   Bilirubin Total 0.2 0.0 - 1.2 mg/dL   Alkaline Phosphatase 77 44 - 121 IU/L   AST 17 0 - 40 IU/L   ALT 21 0 - 32 IU/L  Hemoglobin A1c     Status: None   Collection Time: 06/10/23 10:59 AM  Result Value Ref Range   Hgb A1c MFr Bld 5.2 4.8 - 5.6 %    Comment:          Prediabetes: 5.7 - 6.4          Diabetes: >6.4          Glycemic control for adults with diabetes: <7.0    Est. average glucose Bld gHb Est-mCnc 103 mg/dL  TSH     Status: Abnormal   Collection Time: 06/10/23 10:59 AM  Result Value Ref Range   TSH 13.000 (H) 0.450 - 4.500 uIU/mL  Lipid panel     Status: None   Collection Time: 06/10/23 10:59  AM  Result Value Ref Range   Cholesterol, Total 141 100 - 199 mg/dL    Triglycerides 71 0 - 149 mg/dL   HDL 43 >40 mg/dL   VLDL Cholesterol Cal 14 5 - 40 mg/dL   LDL Chol Calc (NIH) 84 0 - 99 mg/dL   Chol/HDL Ratio 3.3 0.0 - 4.4 ratio    Comment:                                   T. Chol/HDL Ratio                                             Men  Women                               1/2 Avg.Risk  3.4    3.3                                   Avg.Risk  5.0    4.4                                2X Avg.Risk  9.6    7.1                                3X Avg.Risk 23.4   11.0        Assessment & Plan:   Problem List Items Addressed This Visit       Other   Obesity, morbid (HCC)   Continue current meds.  Will adjust as needed based on results.  The patient is asked to make an attempt to improve diet and exercise patterns to aid in medical management of this problem. Addressed importance of increasing and maintaining water intake.        Other Visit Diagnoses       Encounter for pregnancy test, result unknown    -  Primary   Urine pregnancy test in office today also negative. Will send hCG blood test for patient.  Will call her with these results when available   Relevant Orders   Beta HCG, Quant ([facility])   POCT urine pregnancy     Nonspecific abnormal results of thyroid function study       Patient's TSH on lab draw earlier this week was abnormal so we will get her T3 and T4 today in the office. Will call her with these results when available   Relevant Orders   TSH+T4F+T3Free       Return to be determined based on results..   Total time spent: 20 minutes  Miki Kins, FNP  06/12/2023   This document may have been prepared by [facility] Voice Recognition software and as such may include unintentional dictation errors.

## 2023-06-13 LAB — TSH+T4F+T3FREE
Free T4: 0.99 ng/dL (ref 0.82–1.77)
T3, Free: 3.1 pg/mL (ref 2.0–4.4)
TSH: 4.96 u[IU]/mL — ABNORMAL HIGH (ref 0.450–4.500)

## 2023-06-13 LAB — BETA HCG QUANT (REF LAB): hCG Quant: <1 m[IU]/mL

## 2023-06-30 ENCOUNTER — Encounter: Payer: Self-pay | Admitting: Family

## 2023-07-13 ENCOUNTER — Ambulatory Visit: Payer: BC Managed Care – PPO | Admitting: Cardiovascular Disease

## 2023-07-22 ENCOUNTER — Other Ambulatory Visit: Payer: Self-pay | Admitting: Family

## 2023-08-28 ENCOUNTER — Ambulatory Visit: Admitting: Cardiovascular Disease

## 2023-09-11 ENCOUNTER — Ambulatory Visit: Admitting: Family

## 2023-09-14 ENCOUNTER — Other Ambulatory Visit

## 2023-09-14 ENCOUNTER — Ambulatory Visit (INDEPENDENT_AMBULATORY_CARE_PROVIDER_SITE_OTHER): Admitting: Cardiovascular Disease

## 2023-09-14 ENCOUNTER — Encounter: Payer: Self-pay | Admitting: Cardiovascular Disease

## 2023-09-14 VITALS — BP 124/78 | HR 81 | Ht 68.0 in | Wt 297.0 lb

## 2023-09-14 DIAGNOSIS — E039 Hypothyroidism, unspecified: Secondary | ICD-10-CM

## 2023-09-14 DIAGNOSIS — R5383 Other fatigue: Secondary | ICD-10-CM

## 2023-09-14 DIAGNOSIS — E559 Vitamin D deficiency, unspecified: Secondary | ICD-10-CM

## 2023-09-14 DIAGNOSIS — I471 Supraventricular tachycardia, unspecified: Secondary | ICD-10-CM | POA: Diagnosis not present

## 2023-09-14 DIAGNOSIS — I10 Essential (primary) hypertension: Secondary | ICD-10-CM | POA: Diagnosis not present

## 2023-09-14 DIAGNOSIS — E041 Nontoxic single thyroid nodule: Secondary | ICD-10-CM

## 2023-09-14 DIAGNOSIS — R0789 Other chest pain: Secondary | ICD-10-CM

## 2023-09-14 DIAGNOSIS — R002 Palpitations: Secondary | ICD-10-CM | POA: Diagnosis not present

## 2023-09-14 DIAGNOSIS — D519 Vitamin B12 deficiency anemia, unspecified: Secondary | ICD-10-CM

## 2023-09-14 DIAGNOSIS — I340 Nonrheumatic mitral (valve) insufficiency: Secondary | ICD-10-CM

## 2023-09-14 MED ORDER — METOPROLOL SUCCINATE ER 100 MG PO TB24: 100.0000 mg | ORAL_TABLET | Freq: Every day | ORAL | 11 refills | Status: DC

## 2023-09-14 NOTE — Progress Notes (Signed)
 Cardiology Office Note   Date:  09/14/2023   ID:  Heather Jacobs, DOB [birth year], MRN [id number]  PCP:  Trenda Frisk, FNP  Cardiologist:  Debborah Fairly, MD      History of Present Illness: Heather Jacobs is a 28 y.o. female who presents for  Chief Complaint  Patient presents with   Follow-up    Follow Up Missed Appt    Infrequent chest pain, only 2-3 times      Past Medical History:  Diagnosis Date   Ectopic pregnancy    Family history of ovarian cancer    3/22 cancer genetic testing letter sent   Ruptured right tubal ectopic pregnancy causing hemoperitoneum 07/18/2018   Supervision of normal first pregnancy, antepartum 10/10/2016   RUBELLA non immune, repeat @ 28 weeks       Past Surgical History:  Procedure Laterality Date   ARTHROSCOPIC REPAIR ACL     DILATION AND EVACUATION  06/27/2018   LAPAROSCOPIC BILATERAL SALPINGECTOMY Bilateral 03/07/2021   Procedure: LAPAROSCOPIC BILATERAL SALPINGECTOMY;  Surgeon: Alben Alma, MD;  Location: [facility];  Service: Gynecology;  Laterality: Bilateral;   LAPAROSCOPY N/A 07/18/2018   Procedure: LAPAROSCOPY DIAGNOSTIC,right salpingostomy;  Surgeon: Alben Alma, MD;  Location: [facility];  Service: Gynecology;  Laterality: N/A;  ectopic supected to be in blood loss     Current Outpatient Medications  Medication Sig Dispense Refill   metoprolol  succinate (TOPROL  XL) 100 MG 24 hr tablet Take 1 tablet (100 mg total) by mouth daily. Take with or immediately following a meal. 30 tablet 11   butalbital-acetaminophen -caffeine (FIORICET) 50-325-40 MG tablet Take 1 tablet by mouth every 4 (four) hours as needed (has needed for migraines).     flecainide  (TAMBOCOR ) 100 MG tablet Take 1 tablet (100 mg total) by mouth 2 (two) times daily. 60 tablet 11   levothyroxine  (SYNTHROID ) 75 MCG tablet TAKE 1 TABLET(75 MCG) BY MOUTH DAILY BEFORE BREAKFAST 90 tablet 1   metoprolol  tartrate (LOPRESSOR ) 25 MG tablet Take 1 tablet (25 mg  total) by mouth 2 (two) times daily. 60 tablet 11   nortriptyline (PAMELOR) 10 MG capsule 10 mg at bedtime.     pantoprazole  (PROTONIX ) 40 MG tablet Take 1 tablet (40 mg total) by mouth daily. 30 tablet 1   venlafaxine XR (EFFEXOR-XR) 75 MG 24 hr capsule Take 75 mg by mouth daily with breakfast.     No current facility-administered medications for this visit.    Allergies:   Chlorhexidine  and Clindamycin /lincomycin    Social History:   reports that she has never smoked. She has never used smokeless tobacco. She reports that she does not drink alcohol and does not use drugs.   Family History:  family history includes Diabetes Mellitus I in her father; Hashimoto's thyroiditis in her mother; Heart defect in her father; Hypothyroidism in her mother; Ovarian cancer (age of onset: 37) in her paternal aunt.    ROS:     Review of Systems  Constitutional: Negative.   HENT: Negative.    Eyes: Negative.   Respiratory: Negative.    Gastrointestinal: Negative.   Genitourinary: Negative.   Musculoskeletal: Negative.   Skin: Negative.   Neurological: Negative.   Endo/Heme/Allergies: Negative.   Psychiatric/Behavioral: Negative.    All other systems reviewed and are negative.     All other systems are reviewed and negative.    PHYSICAL EXAM: VS:  BP 124/78   Pulse 81   Ht 5\' 8"  (1.727 m)   Wt  297 lb (134.7 kg)   SpO2 98%   BMI 45.16 kg/m  , BMI Body mass index is 45.16 kg/m. Last weight:  Wt Readings from Last 3 Encounters:  09/14/23 297 lb (134.7 kg)  06/12/23 289 lb 9.6 oz (131.4 kg)  06/01/23 284 lb (128.8 kg)     Physical Exam Constitutional:      Appearance: Normal appearance.  Cardiovascular:     Rate and Rhythm: Normal rate and regular rhythm.     Heart sounds: Normal heart sounds.  Pulmonary:     Effort: Pulmonary effort is normal.     Breath sounds: Normal breath sounds.  Musculoskeletal:     Right lower leg: No edema.     Left lower leg: No edema.   Neurological:     Mental Status: She is alert.       EKG:   Recent Labs: 12/01/2022: Hemoglobin 12.4; Platelets 249 06/10/2023: ALT 21; BUN 9; Creatinine, Ser 0.82; Potassium 4.6; Sodium 139 06/12/2023: TSH 4.960    Lipid Panel    Component Value Date/Time   CHOL 141 06/10/2023 1059   TRIG 71 06/10/2023 1059   HDL 43 06/10/2023 1059   CHOLHDL 3.3 06/10/2023 1059   LDLCALC 84 06/10/2023 1059      Other studies Reviewed: Additional studies/ records that were reviewed today include:  Review of the above records demonstrates:      01/26/2019    8:41 AM  PAD Screen  Previous PAD dx? No  Previous surgical procedure? No  Pain with walking? No  Feet/toe relief with dangling? No  Painful, non-healing ulcers? No  Extremities discolored? No      ASSESSMENT AND PLAN:    ICD-10-CM   1. SVT (supraventricular tachycardia) (HCC)  I47.10 metoprolol  succinate (TOPROL  XL) 100 MG 24 hr tablet   In NSR    2. Primary hypertension  I10 metoprolol  succinate (TOPROL  XL) 100 MG 24 hr tablet    3. Palpitations  R00.2 metoprolol  succinate (TOPROL  XL) 100 MG 24 hr tablet   change metoprolol  100 daily    4. Atypical chest pain  R07.89 metoprolol  succinate (TOPROL  XL) 100 MG 24 hr tablet   less CP with flecanide 100 bid    5. Nonrheumatic mitral valve regurgitation  I34.0 metoprolol  succinate (TOPROL  XL) 100 MG 24 hr tablet       Problem List Items Addressed This Visit       Other   Palpitations   Relevant Medications   metoprolol  succinate (TOPROL  XL) 100 MG 24 hr tablet   Atypical chest pain   Relevant Medications   metoprolol  succinate (TOPROL  XL) 100 MG 24 hr tablet   Other Visit Diagnoses       SVT (supraventricular tachycardia) (HCC)    -  Primary   In NSR   Relevant Medications   metoprolol  succinate (TOPROL  XL) 100 MG 24 hr tablet     Primary hypertension       Relevant Medications   metoprolol  succinate (TOPROL  XL) 100 MG 24 hr tablet     Nonrheumatic mitral  valve regurgitation       Relevant Medications   metoprolol  succinate (TOPROL  XL) 100 MG 24 hr tablet          Disposition:   Return in about 2 months (around 11/14/2023).    Total time spent: 30 minutes  Signed,  Debborah Fairly, MD  09/14/2023 10:26 AM    [facility]

## 2023-09-15 ENCOUNTER — Ambulatory Visit: Payer: Self-pay

## 2023-09-15 LAB — CMP14+EGFR
ALT: 21 IU/L (ref 0–32)
AST: 21 IU/L (ref 0–40)
Albumin: 4.2 g/dL (ref 4.0–5.0)
Alkaline Phosphatase: 77 IU/L (ref 44–121)
BUN/Creatinine Ratio: 11 (ref 9–23)
BUN: 10 mg/dL (ref 6–20)
Bilirubin Total: 0.2 mg/dL (ref 0.0–1.2)
CO2: 21 mmol/L (ref 20–29)
Calcium: 9.3 mg/dL (ref 8.7–10.2)
Chloride: 104 mmol/L (ref 96–106)
Creatinine, Ser: 0.87 mg/dL (ref 0.57–1.00)
Globulin, Total: 2.7 g/dL (ref 1.5–4.5)
Glucose: 93 mg/dL (ref 70–99)
Potassium: 4.5 mmol/L (ref 3.5–5.2)
Sodium: 138 mmol/L (ref 134–144)
Total Protein: 6.9 g/dL (ref 6.0–8.5)
eGFR: 93 mL/min/{1.73_m2} (ref >59)

## 2023-09-15 LAB — LIPID PANEL
Chol/HDL Ratio: 3.2 ratio (ref 0.0–4.4)
Cholesterol, Total: 135 mg/dL (ref 100–199)
HDL: 42 mg/dL (ref >39)
LDL Chol Calc (NIH): 78 mg/dL (ref 0–99)
Triglycerides: 73 mg/dL (ref 0–149)
VLDL Cholesterol Cal: 15 mg/dL (ref 5–40)

## 2023-09-15 LAB — VITAMIN D 25 HYDROXY (VIT D DEFICIENCY, FRACTURES): Vit D, 25-Hydroxy: 30.3 ng/mL (ref 30.0–100.0)

## 2023-09-15 LAB — TSH: TSH: 4.03 u[IU]/mL (ref 0.450–4.500)

## 2023-09-15 LAB — HEMOGLOBIN A1C
Est. average glucose Bld gHb Est-mCnc: 103 mg/dL
Hgb A1c MFr Bld: 5.2 % (ref 4.8–5.6)

## 2023-09-15 LAB — VITAMIN B12: Vitamin B-12: 255 pg/mL (ref 232–1245)

## 2023-09-16 ENCOUNTER — Encounter: Payer: Self-pay | Admitting: Family

## 2023-09-16 ENCOUNTER — Ambulatory Visit (INDEPENDENT_AMBULATORY_CARE_PROVIDER_SITE_OTHER): Admitting: Family

## 2023-09-16 VITALS — BP 110/70 | HR 85 | Ht 68.0 in | Wt 300.4 lb

## 2023-09-16 DIAGNOSIS — R5383 Other fatigue: Secondary | ICD-10-CM | POA: Diagnosis not present

## 2023-09-16 DIAGNOSIS — E538 Deficiency of other specified B group vitamins: Secondary | ICD-10-CM | POA: Diagnosis not present

## 2023-09-16 DIAGNOSIS — R0683 Snoring: Secondary | ICD-10-CM

## 2023-09-16 DIAGNOSIS — E039 Hypothyroidism, unspecified: Secondary | ICD-10-CM

## 2023-09-16 DIAGNOSIS — Z0130 Encounter for examination of blood pressure without abnormal findings: Secondary | ICD-10-CM

## 2023-09-16 MED ORDER — CYANOCOBALAMIN 1000 MCG/ML IJ SOLN
1000.0000 ug | Freq: Once | INTRAMUSCULAR | Status: AC
Start: 2023-09-16 — End: 2023-09-16
  Administered 2023-09-16: 1000 ug via INTRAMUSCULAR

## 2023-09-16 MED ORDER — WEGOVY 0.5 MG/0.5ML ~~LOC~~ SOAJ: 0.5000 mg | SUBCUTANEOUS | 0 refills | Status: DC

## 2023-09-16 NOTE — Progress Notes (Signed)
 Established Patient Office Visit  Subjective:  Patient ID: Heather Jacobs, female    DOB: 08-14-1995  Age: 28 y.o. MRN: [id number]  Chief Complaint  Patient presents with   Follow-up    Thyroid  follow up    Patient is here today for her 3 months follow up.  She has been feeling fairly well since last appointment.   She does have additional concerns to discuss today.  She has been having severe fatigue, and says that she was worried her thyroid  would be abnormal, but it was not.  She did have B12 on the low side of normal, but her other labs looked good.   Asks about weight loss medications.  Also asks about a possible sleep study, says that Chelsa had tried to get her one previously, but her insurance did not want to pay for it.   Labs were done previously, so we will review these in detail today. She needs refills.   I have reviewed her active problem list, medication list, allergies, notes from last encounter, lab results for her appointment today.      No other concerns at this time.   Past Medical History:  Diagnosis Date   Ectopic pregnancy    Family history of ovarian cancer    3/22 cancer genetic testing letter sent   Ruptured right tubal ectopic pregnancy causing hemoperitoneum 07/18/2018   Supervision of normal first pregnancy, antepartum 10/10/2016   RUBELLA non immune, repeat @ 28 weeks      Past Surgical History:  Procedure Laterality Date   ARTHROSCOPIC REPAIR ACL     DILATION AND EVACUATION  06/27/2018   LAPAROSCOPIC BILATERAL SALPINGECTOMY Bilateral 03/07/2021   Procedure: LAPAROSCOPIC BILATERAL SALPINGECTOMY;  Surgeon: Alben Alma, MD;  Location: [facility];  Service: Gynecology;  Laterality: Bilateral;   LAPAROSCOPY N/A 07/18/2018   Procedure: LAPAROSCOPY DIAGNOSTIC,right salpingostomy;  Surgeon: Alben Alma, MD;  Location: [facility];  Service: Gynecology;  Laterality: N/A;  ectopic supected to be in blood loss    Social History    Socioeconomic History   Marital status: Married    Spouse name: Nature conservation officer   Number of children: 2   Years of education: Not on file   Highest education level: Not on file  Occupational History   Not on file  Tobacco Use   Smoking status: Never   Smokeless tobacco: Never  Vaping Use   Vaping status: Never Used  Substance and Sexual Activity   Alcohol use: Never   Drug use: Never   Sexual activity: Yes    Birth control/protection: I.U.D.  Other Topics Concern   Not on file  Social History Narrative   Not on file   Social Drivers of Health   Financial Resource Strain: Not on file  Food Insecurity: Not on file  Transportation Needs: Not on file  Physical Activity: Not on file  Stress: Not on file  Social Connections: Not on file  Intimate Partner Violence: Not on file    Family History  Problem Relation Age of Onset   Hypothyroidism Mother    Hashimoto's thyroiditis Mother    Diabetes Mellitus I Father    Heart defect Father        Aortic stenosis (born with it)   Ovarian cancer Paternal Aunt 9    Allergies  Allergen Reactions   Chlorhexidine  Hives and Other (See Comments)    States she's allergic   Clindamycin /Lincomycin Rash    Review of Systems  Constitutional:  Positive for  malaise/fatigue.  All other systems reviewed and are negative.      Objective:   BP 110/70   Pulse 85   Ht 5\' 8"  (1.727 m)   Wt (!) 300 lb 6.4 oz (136.3 kg)   SpO2 98%   BMI 45.68 kg/m   Vitals:   09/16/23 0912  BP: 110/70  Pulse: 85  Height: 5\' 8"  (1.727 m)  Weight: (!) 300 lb 6.4 oz (136.3 kg)  SpO2: 98%  BMI (Calculated): 45.69    Physical Exam Vitals and nursing note reviewed.  Constitutional:      Appearance: Normal appearance. She is normal weight.  HENT:     Head: Normocephalic.  Eyes:     Extraocular Movements: Extraocular movements intact.     Conjunctiva/sclera: Conjunctivae normal.     Pupils: Pupils are equal, round, and reactive to light.   Cardiovascular:     Rate and Rhythm: Normal rate and regular rhythm.     Pulses: Normal pulses.     Heart sounds: Normal heart sounds.  Pulmonary:     Effort: Pulmonary effort is normal.     Breath sounds: Normal breath sounds.  Musculoskeletal:        General: Normal range of motion.  Neurological:     General: No focal deficit present.     Mental Status: She is alert and oriented to person, place, and time. Mental status is at baseline.  Psychiatric:        Mood and Affect: Mood normal.        Behavior: Behavior normal.        Thought Content: Thought content normal.        Judgment: Judgment normal.      No results found for any visits on 09/16/23.  Recent Results (from the past 2160 hours)  Hemoglobin A1c     Status: None   Collection Time: 09/14/23 10:34 AM  Result Value Ref Range   Hgb A1c MFr Bld 5.2 4.8 - 5.6 %    Comment:          Prediabetes: 5.7 - 6.4          Diabetes: >6.4          Glycemic control for adults with diabetes: <7.0    Est. average glucose Bld gHb Est-mCnc 103 mg/dL  TSH     Status: None   Collection Time: 09/14/23 10:34 AM  Result Value Ref Range   TSH 4.030 0.450 - 4.500 uIU/mL  CMP14+EGFR     Status: None   Collection Time: 09/14/23 10:34 AM  Result Value Ref Range   Glucose 93 70 - 99 mg/dL   BUN 10 6 - 20 mg/dL   Creatinine, Ser 2.95 0.57 - 1.00 mg/dL   eGFR 93 >62 ZH/YQM/5.78   BUN/Creatinine Ratio 11 9 - 23   Sodium 138 134 - 144 mmol/L   Potassium 4.5 3.5 - 5.2 mmol/L   Chloride 104 96 - 106 mmol/L   CO2 21 20 - 29 mmol/L   Calcium 9.3 8.7 - 10.2 mg/dL   Total Protein 6.9 6.0 - 8.5 g/dL   Albumin 4.2 4.0 - 5.0 g/dL   Globulin, Total 2.7 1.5 - 4.5 g/dL   Bilirubin Total 0.2 0.0 - 1.2 mg/dL   Alkaline Phosphatase 77 44 - 121 IU/L   AST 21 0 - 40 IU/L   ALT 21 0 - 32 IU/L  Lipid panel     Status: None   Collection Time: 09/14/23 10:34  AM  Result Value Ref Range   Cholesterol, Total 135 100 - 199 mg/dL   Triglycerides 73 0 -  149 mg/dL   HDL 42 >29 mg/dL   VLDL Cholesterol Cal 15 5 - 40 mg/dL   LDL Chol Calc (NIH) 78 0 - 99 mg/dL   Chol/HDL Ratio 3.2 0.0 - 4.4 ratio    Comment:                                   T. Chol/HDL Ratio                                             Men  Women                               1/2 Avg.Risk  3.4    3.3                                   Avg.Risk  5.0    4.4                                2X Avg.Risk  9.6    7.1                                3X Avg.Risk 23.4   11.0   Vitamin B12     Status: None   Collection Time: 09/14/23 10:34 AM  Result Value Ref Range   Vitamin B-12 255 232 - 1,245 pg/mL  Vitamin D (25 hydroxy)     Status: None   Collection Time: 09/14/23 10:34 AM  Result Value Ref Range   Vit D, 25-Hydroxy 30.3 30.0 - 100.0 ng/mL    Comment: Vitamin D deficiency has been defined by the Institute of Medicine and an Endocrine Society practice guideline as a level of serum 25-OH vitamin D less than 20 ng/mL (1,2). The Endocrine Society went on to further define vitamin D insufficiency as a level between 21 and 29 ng/mL (2). 1. IOM (Institute of Medicine). 2010. Dietary reference    intakes for calcium and D. [place]: The    [employer]. 2. Holick MF, Binkley [place], Bischoff-Ferrari HA, et al.    Evaluation, treatment, and prevention of vitamin D    deficiency: an Endocrine Society clinical practice    guideline. JCEM. 2011 Jul; 96(7):1911-30.   T4     Status: None   Collection Time: 09/14/23 10:34 AM  Result Value Ref Range   T4, Total 7.5 4.5 - 12.0 ug/dL  T3     Status: None   Collection Time: 09/14/23 10:34 AM  Result Value Ref Range   T3, Total 131 71 - 180 ng/dL  Specimen status report     Status: None   Collection Time: 09/14/23 10:34 AM  Result Value Ref Range   specimen status report Comment     Comment: Written Authorization Written Authorization Written Authorization Received. Authorization received from [place]  for [employer] on 09-16-2023 Logged by Criselda Dolly  Assessment & Plan:   Problem List Items Addressed This Visit       Endocrine   Hypothyroidism   Patient stable.  Well controlled with current therapy.   Continue current meds.          Other   Other fatigue   Sleep study ordered.  Pt given B12 injection in office today.   Will reassess at follow up.       Obesity, morbid (HCC)   Starting patient on Wegovy.  Will reassess in 1 month for side effects.   Sample of first month given.       Relevant Medications   Semaglutide-Weight Management (WEGOVY) 0.5 MG/0.5ML SOAJ   B12 deficiency   B12 injection given in office today.  Return for next injection per provider instructions.       Other Visit Diagnoses       Snoring    -  Primary   Sleep study ordered today.  Will contact pt. with results when available.   Relevant Orders   Ambulatory referral to Sleep Studies       Return in about 1 month (around 10/17/2023).   Total time spent: 20 minutes  Trenda Frisk, FNP  09/16/2023   This document may have been prepared by [facility] Voice Recognition software and as such may include unintentional dictation errors.

## 2023-09-17 DIAGNOSIS — E538 Deficiency of other specified B group vitamins: Secondary | ICD-10-CM | POA: Insufficient documentation

## 2023-09-17 LAB — T3: T3, Total: 131 ng/dL (ref 71–180)

## 2023-09-17 LAB — SPECIMEN STATUS REPORT

## 2023-09-17 LAB — T4: T4, Total: 7.5 ug/dL (ref 4.5–12.0)

## 2023-09-17 NOTE — Assessment & Plan Note (Signed)
 Patient stable.  Well controlled with current therapy.   Continue current meds.

## 2023-09-17 NOTE — Assessment & Plan Note (Signed)
 Starting patient on Wegovy.  Will reassess in 1 month for side effects.   Sample of first month given.

## 2023-09-17 NOTE — Assessment & Plan Note (Signed)
 Sleep study ordered.  Pt given B12 injection in office today.   Will reassess at follow up.

## 2023-09-17 NOTE — Assessment & Plan Note (Signed)
 B12 injection given in office today.  Return for next injection per provider instructions.

## 2023-10-09 ENCOUNTER — Ambulatory Visit: Payer: Self-pay

## 2023-10-19 ENCOUNTER — Ambulatory Visit: Admitting: Family

## 2023-10-27 ENCOUNTER — Ambulatory Visit: Admitting: Family

## 2023-10-27 ENCOUNTER — Encounter: Payer: Self-pay | Admitting: Family

## 2023-10-27 VITALS — BP 110/80 | HR 88 | Ht 68.0 in | Wt 298.4 lb

## 2023-10-27 DIAGNOSIS — R21 Rash and other nonspecific skin eruption: Secondary | ICD-10-CM

## 2023-10-27 DIAGNOSIS — R5383 Other fatigue: Secondary | ICD-10-CM

## 2023-10-27 DIAGNOSIS — E039 Hypothyroidism, unspecified: Secondary | ICD-10-CM

## 2023-10-27 DIAGNOSIS — W57XXXD Bitten or stung by nonvenomous insect and other nonvenomous arthropods, subsequent encounter: Secondary | ICD-10-CM

## 2023-10-27 DIAGNOSIS — E538 Deficiency of other specified B group vitamins: Secondary | ICD-10-CM

## 2023-10-29 ENCOUNTER — Encounter: Payer: Self-pay | Admitting: Family

## 2023-10-30 LAB — SPOTTED FEVER GROUP ANTIBODIES
Spotted Fever Group IgG: <1:64 {titer}
Spotted Fever Group IgM: <1:64 {titer}

## 2023-10-30 LAB — LYME DISEASE SEROLOGY W/REFLEX: Lyme Total Antibody EIA: NEGATIVE

## 2023-10-30 LAB — INTRINSIC FACTOR ANTIBODIES: Intrinsic Factor Abs, Serum: 1.1 [AU]/ml (ref 0.0–1.1)

## 2023-11-02 ENCOUNTER — Other Ambulatory Visit: Payer: Self-pay | Admitting: Family

## 2023-11-02 ENCOUNTER — Ambulatory Visit: Payer: Self-pay

## 2023-11-02 MED ORDER — TIRZEPATIDE-WEIGHT MANAGEMENT 2.5 MG/0.5ML ~~LOC~~ SOLN: 2.5000 mg | SUBCUTANEOUS | 0 refills | Status: DC

## 2023-11-17 ENCOUNTER — Ambulatory Visit (INDEPENDENT_AMBULATORY_CARE_PROVIDER_SITE_OTHER): Admitting: Cardiovascular Disease

## 2023-11-17 ENCOUNTER — Encounter: Payer: Self-pay | Admitting: Cardiovascular Disease

## 2023-11-17 VITALS — BP 118/78 | HR 83 | Ht 68.0 in | Wt 296.4 lb

## 2023-11-17 DIAGNOSIS — R002 Palpitations: Secondary | ICD-10-CM | POA: Diagnosis not present

## 2023-11-17 DIAGNOSIS — I340 Nonrheumatic mitral (valve) insufficiency: Secondary | ICD-10-CM

## 2023-11-17 DIAGNOSIS — Z0130 Encounter for examination of blood pressure without abnormal findings: Secondary | ICD-10-CM

## 2023-11-17 DIAGNOSIS — I471 Supraventricular tachycardia, unspecified: Secondary | ICD-10-CM

## 2023-11-17 DIAGNOSIS — R0789 Other chest pain: Secondary | ICD-10-CM

## 2023-11-17 NOTE — Progress Notes (Signed)
 Cardiology Office Note   Date:  11/17/2023   ID:  Heather Jacobs, DOB March 07, 1996, MRN [id number]  PCP:  Orlean Alan HERO, FNP  Cardiologist:  Denyse Bathe, MD      History of Present Illness: Heather Jacobs is a 28 y.o. female who presents for  Chief Complaint  Patient presents with   Follow-up    Has no chest pain or SOB. Feels fine.      Past Medical History:  Diagnosis Date   Ectopic pregnancy    Family history of ovarian cancer    3/22 cancer genetic testing letter sent   Ruptured right tubal ectopic pregnancy causing hemoperitoneum 07/18/2018   Supervision of normal first pregnancy, antepartum 10/10/2016   RUBELLA non immune, repeat @ 28 weeks       Past Surgical History:  Procedure Laterality Date   ARTHROSCOPIC REPAIR ACL     DILATION AND EVACUATION  06/27/2018   LAPAROSCOPIC BILATERAL SALPINGECTOMY Bilateral 03/07/2021   Procedure: LAPAROSCOPIC BILATERAL SALPINGECTOMY;  Surgeon: Arloa Lamar SQUIBB, MD;  Location: [facility];  Service: Gynecology;  Laterality: Bilateral;   LAPAROSCOPY N/A 07/18/2018   Procedure: LAPAROSCOPY DIAGNOSTIC,right salpingostomy;  Surgeon: Arloa Lamar SQUIBB, MD;  Location: [facility];  Service: Gynecology;  Laterality: N/A;  ectopic supected to be in blood loss     Current Outpatient Medications  Medication Sig Dispense Refill   butalbital-acetaminophen -caffeine (FIORICET) 50-325-40 MG tablet Take 1 tablet by mouth every 4 (four) hours as needed (has needed for migraines).     flecainide  (TAMBOCOR ) 100 MG tablet Take 1 tablet (100 mg total) by mouth 2 (two) times daily. 60 tablet 11   levothyroxine  (SYNTHROID ) 75 MCG tablet TAKE 1 TABLET(75 MCG) BY MOUTH DAILY BEFORE BREAKFAST 90 tablet 1   metoprolol  succinate (TOPROL  XL) 100 MG 24 hr tablet Take 1 tablet (100 mg total) by mouth daily. Take with or immediately following a meal. 30 tablet 11   nortriptyline (PAMELOR) 10 MG capsule 10 mg in the morning.     nortriptyline (PAMELOR) 50  MG capsule Take 50 mg by mouth at bedtime.     pantoprazole  (PROTONIX ) 40 MG tablet Take 1 tablet (40 mg total) by mouth daily. 30 tablet 1   tirzepatide  (ZEPBOUND ) 2.5 MG/0.5ML injection vial Inject 2.5 mg into the skin once a week. 2 mL 0   venlafaxine XR (EFFEXOR-XR) 75 MG 24 hr capsule Take 150 mg by mouth daily with breakfast.     Semaglutide -Weight Management (WEGOVY ) 0.5 MG/0.5ML SOAJ Inject 0.5 mg into the skin once a week. (Patient not taking: Reported on 11/17/2023) 2 mL 0   venlafaxine XR (EFFEXOR-XR) 75 MG 24 hr capsule Take 75 mg by mouth daily with breakfast. (Patient not taking: Reported on 11/17/2023)     No current facility-administered medications for this visit.    Allergies:   Chlorhexidine  and Clindamycin /lincomycin    Social History:   reports that she has never smoked. She has never used smokeless tobacco. She reports that she does not drink alcohol and does not use drugs.   Family History:  family history includes Diabetes Mellitus I in her father; Hashimoto's thyroiditis in her mother; Heart defect in her father; Hypothyroidism in her mother; Ovarian cancer (age of onset: 58) in her paternal aunt.    ROS:     Review of Systems  Constitutional: Negative.   HENT: Negative.    Eyes: Negative.   Respiratory: Negative.    Gastrointestinal: Negative.   Genitourinary: Negative.   Musculoskeletal:  Negative.   Skin: Negative.   Neurological: Negative.   Endo/Heme/Allergies: Negative.   Psychiatric/Behavioral: Negative.    All other systems reviewed and are negative.     All other systems are reviewed and negative.    PHYSICAL EXAM: VS:  BP 118/78   Pulse 83   Ht 5' 8 (1.727 m)   Wt 296 lb 6.4 oz (134.4 kg)   SpO2 97%   BMI 45.07 kg/m  , BMI Body mass index is 45.07 kg/m. Last weight:  Wt Readings from Last 3 Encounters:  11/17/23 296 lb 6.4 oz (134.4 kg)  10/27/23 298 lb 6.4 oz (135.4 kg)  09/16/23 (!) 300 lb 6.4 oz (136.3 kg)     Physical  Exam Constitutional:      Appearance: Normal appearance.  Cardiovascular:     Rate and Rhythm: Normal rate and regular rhythm.     Heart sounds: Normal heart sounds.  Pulmonary:     Effort: Pulmonary effort is normal.     Breath sounds: Normal breath sounds.  Musculoskeletal:     Right lower leg: No edema.     Left lower leg: No edema.  Neurological:     Mental Status: She is alert.       EKG:   Recent Labs: 12/01/2022: Hemoglobin 12.4; Platelets 249 09/14/2023: ALT 21; BUN 10; Creatinine, Ser 0.87; Potassium 4.5; Sodium 138; TSH 4.030    Lipid Panel    Component Value Date/Time   CHOL 135 09/14/2023 1034   TRIG 73 09/14/2023 1034   HDL 42 09/14/2023 1034   CHOLHDL 3.2 09/14/2023 1034   LDLCALC 78 09/14/2023 1034      Other studies Reviewed: Additional studies/ records that were reviewed today include:  Review of the above records demonstrates:      01/26/2019    8:41 AM  PAD Screen  Previous PAD dx? No  Previous surgical procedure? No  Pain with walking? No  Feet/toe relief with dangling? No  Painful, non-healing ulcers? No  Extremities discolored? No      ASSESSMENT AND PLAN:    ICD-10-CM   1. SVT (supraventricular tachycardia) (HCC)  I47.10    on flecanide 100 bid and  metoprolol  100 feels much better, with no more episodes of palpitation/chest pains.    2. Atypical chest pain  R07.89     3. Palpitations  R00.2     4. Nonrheumatic mitral valve regurgitation  I34.0     5. Other chest pain  R07.89        Problem List Items Addressed This Visit       Other   Palpitations   Atypical chest pain   Other Visit Diagnoses       SVT (supraventricular tachycardia) (HCC)    -  Primary   on flecanide 100 bid and  metoprolol  100 feels much better, with no more episodes of palpitation/chest pains.     Nonrheumatic mitral valve regurgitation         Other chest pain              Disposition:   Return in about 3 months (around 02/17/2024).     Total time spent: 35 minutes  Signed,  Denyse Bathe, MD  11/17/2023 10:10 AM    [facility]

## 2023-12-02 ENCOUNTER — Encounter: Payer: Self-pay | Admitting: Family

## 2023-12-02 ENCOUNTER — Ambulatory Visit (INDEPENDENT_AMBULATORY_CARE_PROVIDER_SITE_OTHER): Admitting: Family

## 2023-12-02 VITALS — BP 104/66 | HR 90 | Ht 68.0 in | Wt 294.0 lb

## 2023-12-02 DIAGNOSIS — M2550 Pain in unspecified joint: Secondary | ICD-10-CM | POA: Diagnosis not present

## 2023-12-02 DIAGNOSIS — R5383 Other fatigue: Secondary | ICD-10-CM | POA: Diagnosis not present

## 2023-12-02 DIAGNOSIS — M791 Myalgia, unspecified site: Secondary | ICD-10-CM

## 2023-12-02 DIAGNOSIS — Z0130 Encounter for examination of blood pressure without abnormal findings: Secondary | ICD-10-CM

## 2023-12-02 MED ORDER — ZEPBOUND 5 MG/0.5ML ~~LOC~~ SOAJ: 5.0000 mg | SUBCUTANEOUS | 0 refills | Status: DC

## 2023-12-02 NOTE — Progress Notes (Signed)
 Established Patient Office Visit  Subjective:  Patient ID: Heather Jacobs, female    DOB: 1995/07/04  Age: 28 y.o. MRN: [id number]  Chief Complaint  Patient presents with   Follow-up    Patient is here today for her 2 months follow up.  She has been feeling fairly well since last appointment.   She does have additional concerns to discuss today.  She has been doing well with the zepbound , no major side effects.  Unrelated, she has had significant issues with joint pain and discomfort.   Labs are not due today.  She needs refills.   I have reviewed her active problem list, medication list, allergies, notes from last encounter, lab results for her appointment today.      No other concerns at this time.   Past Medical History:  Diagnosis Date   Ectopic pregnancy    Family history of ovarian cancer    3/22 cancer genetic testing letter sent   Ruptured right tubal ectopic pregnancy causing hemoperitoneum 07/18/2018   Supervision of normal first pregnancy, antepartum 10/10/2016   RUBELLA non immune, repeat @ 28 weeks      Past Surgical History:  Procedure Laterality Date   ARTHROSCOPIC REPAIR ACL     DILATION AND EVACUATION  06/27/2018   LAPAROSCOPIC BILATERAL SALPINGECTOMY Bilateral 03/07/2021   Procedure: LAPAROSCOPIC BILATERAL SALPINGECTOMY;  Surgeon: Arloa Lamar SQUIBB, MD;  Location: [facility];  Service: Gynecology;  Laterality: Bilateral;   LAPAROSCOPY N/A 07/18/2018   Procedure: LAPAROSCOPY DIAGNOSTIC,right salpingostomy;  Surgeon: Arloa Lamar SQUIBB, MD;  Location: [facility];  Service: Gynecology;  Laterality: N/A;  ectopic supected to be in blood loss    Social History   Socioeconomic History   Marital status: Married    Spouse name: Nature conservation officer   Number of children: 2   Years of education: Not on file   Highest education level: Not on file  Occupational History   Not on file  Tobacco Use   Smoking status: Never   Smokeless tobacco: Never  Vaping Use   Vaping  status: Never Used  Substance and Sexual Activity   Alcohol use: Never   Drug use: Never   Sexual activity: Yes    Birth control/protection: I.U.D.  Other Topics Concern   Not on file  Social History Narrative   Not on file   Social Drivers of Health   Financial Resource Strain: Not on file  Food Insecurity: Not on file  Transportation Needs: Not on file  Physical Activity: Not on file  Stress: Not on file  Social Connections: Not on file  Intimate Partner Violence: Not on file    Family History  Problem Relation Age of Onset   Hypothyroidism Mother    Hashimoto's thyroiditis Mother    Diabetes Mellitus I Father    Heart defect Father        Aortic stenosis (born with it)   Ovarian cancer Paternal Aunt 64    Allergies  Allergen Reactions   Chlorhexidine  Hives and Other (See Comments)    States she's allergic   Clindamycin /Lincomycin Rash    Review of Systems  All other systems reviewed and are negative.      Objective:   BP 104/66   Pulse 90   Ht 5' 8 (1.727 m)   Wt 294 lb (133.4 kg)   SpO2 99%   BMI 44.70 kg/m   Vitals:   12/02/23 1055  BP: 104/66  Pulse: 90  Height: 5' 8 (1.727 m)  Weight:  294 lb (133.4 kg)  SpO2: 99%  BMI (Calculated): 44.71    Physical Exam Vitals and nursing note reviewed.  Constitutional:      Appearance: Normal appearance. She is obese.  HENT:     Head: Normocephalic.  Eyes:     Extraocular Movements: Extraocular movements intact.     Conjunctiva/sclera: Conjunctivae normal.     Pupils: Pupils are equal, round, and reactive to light.  Cardiovascular:     Rate and Rhythm: Normal rate.  Pulmonary:     Effort: Pulmonary effort is normal.  Neurological:     General: No focal deficit present.     Mental Status: She is alert and oriented to person, place, and time. Mental status is at baseline.  Psychiatric:        Mood and Affect: Mood normal.        Behavior: Behavior normal.        Thought Content: Thought  content normal.      No results found for any visits on 12/02/23.  Recent Results (from the past 2160 hours)  Hemoglobin A1c     Status: None   Collection Time: 09/14/23 10:34 AM  Result Value Ref Range   Hgb A1c MFr Bld 5.2 4.8 - 5.6 %    Comment:          Prediabetes: 5.7 - 6.4          Diabetes: >6.4          Glycemic control for adults with diabetes: <7.0    Est. average glucose Bld gHb Est-mCnc 103 mg/dL  TSH     Status: None   Collection Time: 09/14/23 10:34 AM  Result Value Ref Range   TSH 4.030 0.450 - 4.500 uIU/mL  CMP14+EGFR     Status: None   Collection Time: 09/14/23 10:34 AM  Result Value Ref Range   Glucose 93 70 - 99 mg/dL   BUN 10 6 - 20 mg/dL   Creatinine, Ser 9.12 0.57 - 1.00 mg/dL   eGFR 93 >40 fO/fpw/8.26   BUN/Creatinine Ratio 11 9 - 23   Sodium 138 134 - 144 mmol/L   Potassium 4.5 3.5 - 5.2 mmol/L   Chloride 104 96 - 106 mmol/L   CO2 21 20 - 29 mmol/L   Calcium 9.3 8.7 - 10.2 mg/dL   Total Protein 6.9 6.0 - 8.5 g/dL   Albumin 4.2 4.0 - 5.0 g/dL   Globulin, Total 2.7 1.5 - 4.5 g/dL   Bilirubin Total 0.2 0.0 - 1.2 mg/dL   Alkaline Phosphatase 77 44 - 121 IU/L   AST 21 0 - 40 IU/L   ALT 21 0 - 32 IU/L  Lipid panel     Status: None   Collection Time: 09/14/23 10:34 AM  Result Value Ref Range   Cholesterol, Total 135 100 - 199 mg/dL   Triglycerides 73 0 - 149 mg/dL   HDL 42 >60 mg/dL   VLDL Cholesterol Cal 15 5 - 40 mg/dL   LDL Chol Calc (NIH) 78 0 - 99 mg/dL   Chol/HDL Ratio 3.2 0.0 - 4.4 ratio    Comment:                                   T. Chol/HDL Ratio  Men  Women                               1/2 Avg.Risk  3.4    3.3                                   Avg.Risk  5.0    4.4                                2X Avg.Risk  9.6    7.1                                3X Avg.Risk 23.4   11.0   Vitamin B12     Status: None   Collection Time: 09/14/23 10:34 AM  Result Value Ref Range   Vitamin B-12 255 232  - 1,245 pg/mL  Vitamin D  (25 hydroxy)     Status: None   Collection Time: 09/14/23 10:34 AM  Result Value Ref Range   Vit D, 25-Hydroxy 30.3 30.0 - 100.0 ng/mL    Comment: Vitamin D  deficiency has been defined by the Institute of Medicine and an Endocrine Society practice guideline as a level of serum 25-OH vitamin D  less than 20 ng/mL (1,2). The Endocrine Society went on to further define vitamin D  insufficiency as a level between 21 and 29 ng/mL (2). 1. IOM (Institute of Medicine). 2010. Dietary reference    intakes for calcium and D. [place]: The    [employer]. 2. Holick MF, Binkley [place], Bischoff-Ferrari HA, et al.    Evaluation, treatment, and prevention of vitamin D     deficiency: an Endocrine Society clinical practice    guideline. JCEM. 2011 Jul; 96(7):1911-30.   T4     Status: None   Collection Time: 09/14/23 10:34 AM  Result Value Ref Range   T4, Total 7.5 4.5 - 12.0 ug/dL  T3     Status: None   Collection Time: 09/14/23 10:34 AM  Result Value Ref Range   T3, Total 131 71 - 180 ng/dL  Specimen status report     Status: None   Collection Time: 09/14/23 10:34 AM  Result Value Ref Range   specimen status report Comment     Comment: Written Authorization Written Authorization Written Authorization Received. Authorization received from Alan Arrant  for [employer] on 09-16-2023 Logged by Joesph Ion   Lyme Disease Serology w/Reflex     Status: None   Collection Time: 10/27/23  2:20 PM  Result Value Ref Range   Lyme Total Antibody EIA Negative Negative    Comment: Lyme antibodies not detected. Reflex testing is not indicated. No laboratory evidence of infection with B. burgdorferi (Lyme disease). Negative results may occur in patients recently infected (less than or equal to 14 days) with B. burgdorferi.  If recent infection is suspected, repeat testing on a new sample collected in 7 to 14 days is recommended.   Spotted Fever Group  Antibodies     Status: None   Collection Time: 10/27/23  2:20 PM  Result Value Ref Range   Spotted Fever Group IgG <1:64 Neg:<1:64   Spotted Fever Group IgM <1:64 Neg:<1:64   Result Comment Comment     Comment: Spotted Fever  Group IgG serum endpoint titer of >=1:64 is suggestive of infection at an unknown time and may be a sign of either past infection or early response to a recent infection. Spotted Fever Group IgM titer of >=1:64 is regarded as probable evidence of recent or ongoing infection. A four-fold or greater increase in titer between two serum samples drawn 1-2 weeks apart and tested in parallel is the best serologic indicator of a recent rickettsial infection.   Intrinsic Factor Antibodies     Status: None   Collection Time: 10/27/23  2:20 PM  Result Value Ref Range   Intrinsic Factor Abs, Serum 1.1 0.0 - 1.1 AU/mL       Assessment & Plan Myalgia Hypermobility arthralgia Other fatigue Polyarthralgia ANA, RA Panel, and Ehlers Danlos Panel sent today.  Will call with results when available.      Return in about 1 month (around 01/02/2024).   Total time spent: 20 minutes  ALAN CHRISTELLA ARRANT, FNP  12/02/2023   This document may have been prepared by [facility] Voice Recognition software and as such may include unintentional dictation errors.

## 2023-12-02 NOTE — Assessment & Plan Note (Signed)
 ANA, RA Panel, and Mertha Bullock Panel sent today.  Will call with results when available.

## 2023-12-05 ENCOUNTER — Encounter: Payer: Self-pay | Admitting: Family

## 2023-12-10 LAB — ANA 12 PROFILE, DO ALL RDL
Anti-Cardiolipin Ab, IgA (RDL): <12 U/mL (ref <12)
Anti-Cardiolipin Ab, IgG (RDL): 18 GPL U/mL (ref <15)
Anti-Cardiolipin Ab, IgM (RDL): <13 [MPL'U]/mL (ref <13)
Anti-Centromere Ab (RDL): <1:40 {titer}
Anti-La (SS-B) Ab (RDL): <20 U (ref <20)
Anti-Nuclear Ab by IFA (RDL): POSITIVE — AB
Anti-Ro (SS-A) Ab (RDL): <20 U (ref <20)
Anti-Scl-70 Ab (RDL): <20 U (ref <20)
Anti-Sm Ab (RDL): <20 U (ref <20)
Anti-TPO Ab (RDL): 202.9 [IU]/mL — ABNORMAL HIGH (ref <9.0)
Anti-U1 RNP Ab (RDL): <20 U (ref <20)
Anti-dsDNA Ab by Farr(RDL): <8 [IU]/mL (ref <8.0)
C3 Complement (RDL): 171 mg/dL (ref 90–180)
C4 Complement (RDL): 36 mg/dL (ref 10–40)

## 2023-12-10 LAB — ANA TITER AND PATTERN: Homogeneous Pattern: 1:80 {titer} — ABNORMAL HIGH

## 2023-12-10 LAB — RHEUMATOID ARTHRITIS PROFILE
Cyclic Citrullin Peptide Ab: 5 U (ref 0–19)
Rheumatoid fact SerPl-aCnc: <10 [IU]/mL (ref <14.0)

## 2023-12-11 ENCOUNTER — Ambulatory Visit: Payer: Self-pay

## 2023-12-18 LAB — EHLERS-DANLOS SYNDROME PANEL

## 2024-01-01 ENCOUNTER — Ambulatory Visit (INDEPENDENT_AMBULATORY_CARE_PROVIDER_SITE_OTHER): Admitting: Family

## 2024-01-01 ENCOUNTER — Ambulatory Visit
Admission: RE | Admit: 2024-01-01 | Discharge: 2024-01-01 | Disposition: A | Discharge status: Home / Self Care | Attending: Family | Admitting: Family

## 2024-01-01 ENCOUNTER — Encounter: Payer: Self-pay | Admitting: Family

## 2024-01-01 ENCOUNTER — Ambulatory Visit
Admission: RE | Admit: 2024-01-01 | Discharge: 2024-01-01 | Disposition: A | Discharge status: Home / Self Care | Source: Ambulatory Visit | Attending: Family | Admitting: Family

## 2024-01-01 VITALS — BP 112/64 | HR 93 | Ht 68.0 in | Wt 297.2 lb

## 2024-01-01 DIAGNOSIS — M5441 Lumbago with sciatica, right side: Secondary | ICD-10-CM

## 2024-01-01 DIAGNOSIS — G8929 Other chronic pain: Secondary | ICD-10-CM | POA: Insufficient documentation

## 2024-01-01 DIAGNOSIS — M5442 Lumbago with sciatica, left side: Secondary | ICD-10-CM | POA: Insufficient documentation

## 2024-01-01 DIAGNOSIS — R2241 Localized swelling, mass and lump, right lower limb: Secondary | ICD-10-CM

## 2024-01-01 DIAGNOSIS — Z0130 Encounter for examination of blood pressure without abnormal findings: Secondary | ICD-10-CM

## 2024-01-01 NOTE — Progress Notes (Signed)
 Established Patient Office Visit  Subjective:  Patient ID: Heather Jacobs, female    DOB: Jul 21, 1995  Age: 28 y.o. MRN: [id number]  Chief Complaint  Patient presents with   Follow-up    1 month follow up    Patient is here today for her 1 month follow up.  She has been feeling about the same since last appointment.   She does have additional concerns to discuss today.  She has been continuing to have generalized pain on and off in her joints. She says that her right knee and her low back are the worst for pain.   She also says that she has some significant swelling right under her right knee. The area is not extra tender to the touch and is not extremely swollen but there is some inflammation present.  Labs are not due today.  She needs refills.   I have reviewed her active problem list, medication list, allergies, health maintenance, notes from last encounter, lab results for her appointment today.      No other concerns at this time.   Past Medical History:  Diagnosis Date   Ectopic pregnancy    Family history of ovarian cancer    3/22 cancer genetic testing letter sent   Ruptured right tubal ectopic pregnancy causing hemoperitoneum 07/18/2018   Supervision of normal first pregnancy, antepartum 10/10/2016   RUBELLA non immune, repeat @ 28 weeks      Past Surgical History:  Procedure Laterality Date   ARTHROSCOPIC REPAIR ACL     DILATION AND EVACUATION  06/27/2018   LAPAROSCOPIC BILATERAL SALPINGECTOMY Bilateral 03/07/2021   Procedure: LAPAROSCOPIC BILATERAL SALPINGECTOMY;  Surgeon: Arloa Lamar SQUIBB, MD;  Location: [facility];  Service: Gynecology;  Laterality: Bilateral;   LAPAROSCOPY N/A 07/18/2018   Procedure: LAPAROSCOPY DIAGNOSTIC,right salpingostomy;  Surgeon: Arloa Lamar SQUIBB, MD;  Location: [facility] OR[facility];  Service: Gynecology;  Laterality: N/A;  ectopic supected to be in blood loss    Social History   Socioeconomic History   Marital status: Married    Spouse  name: Nature conservation officer   Number of children: 2   Years of education: Not on file   Highest education level: Not on file  Occupational History   Not on file  Tobacco Use   Smoking status: Never   Smokeless tobacco: Never  Vaping Use   Vaping status: Never Used  Substance and Sexual Activity   Alcohol use: Never   Drug use: Never   Sexual activity: Yes    Birth control/protection: I.U.D.  Other Topics Concern   Not on file  Social History Narrative   Not on file   Social Drivers of Health   Financial Resource Strain: Not on file  Food Insecurity: Not on file  Transportation Needs: Not on file  Physical Activity: Not on file  Stress: Not on file  Social Connections: Not on file  Intimate Partner Violence: Not on file    Family History  Problem Relation Age of Onset   Hypothyroidism Mother    Hashimoto's thyroiditis Mother    Diabetes Mellitus I Father    Heart defect Father        Aortic stenosis (born with it)   Ovarian cancer Paternal Aunt 62    Allergies  Allergen Reactions   Chlorhexidine  Hives and Other (See Comments)    States she's allergic   Clindamycin /Lincomycin Rash    Review of Systems  Musculoskeletal:  Positive for joint pain.       Knee swelling  All other systems reviewed and are negative.      Objective:   BP 112/64   Pulse 93   Ht 5' 8 (1.727 m)   Wt 297 lb 3.2 oz (134.8 kg)   SpO2 98%   BMI 45.19 kg/m   Vitals:   01/01/24 1006  BP: 112/64  Pulse: 93  Height: 5' 8 (1.727 m)  Weight: 297 lb 3.2 oz (134.8 kg)  SpO2: 98%  BMI (Calculated): 45.2    Physical Exam Vitals and nursing note reviewed.  Constitutional:      Appearance: Normal appearance. She is obese.  HENT:     Head: Normocephalic and atraumatic.  Eyes:     Extraocular Movements: Extraocular movements intact.     Conjunctiva/sclera: Conjunctivae normal.     Pupils: Pupils are equal, round, and reactive to light.  Cardiovascular:     Rate and Rhythm: Normal rate.      Pulses: Normal pulses.  Pulmonary:     Effort: Pulmonary effort is normal.  Musculoskeletal:        General: Swelling present.     Right knee: Swelling present.       Legs:  Neurological:     General: No focal deficit present.     Mental Status: She is alert and oriented to person, place, and time. Mental status is at baseline.  Psychiatric:        Mood and Affect: Mood normal.        Behavior: Behavior normal.        Thought Content: Thought content normal.      No results found for any visits on 01/01/24.  Recent Results (from the past 2160 hours)  Lyme Disease Serology w/Reflex     Status: None   Collection Time: 10/27/23  2:20 PM  Result Value Ref Range   Lyme Total Antibody EIA Negative Negative    Comment: Lyme antibodies not detected. Reflex testing is not indicated. No laboratory evidence of infection with B. burgdorferi (Lyme disease). Negative results may occur in patients recently infected (less than or equal to 14 days) with B. burgdorferi.  If recent infection is suspected, repeat testing on a new sample collected in 7 to 14 days is recommended.   Spotted Fever Group Antibodies     Status: None   Collection Time: 10/27/23  2:20 PM  Result Value Ref Range   Spotted Fever Group IgG <1:64 Neg:<1:64   Spotted Fever Group IgM <1:64 Neg:<1:64   Result Comment Comment     Comment: Spotted Fever Group IgG serum endpoint titer of >=1:64 is suggestive of infection at an unknown time and may be a sign of either past infection or early response to a recent infection. Spotted Fever Group IgM titer of >=1:64 is regarded as probable evidence of recent or ongoing infection. A four-fold or greater increase in titer between two serum samples drawn 1-2 weeks apart and tested in parallel is the best serologic indicator of a recent rickettsial infection.   Intrinsic Factor Antibodies     Status: None   Collection Time: 10/27/23  2:20 PM  Result Value Ref Range   Intrinsic  Factor Abs, Serum 1.1 0.0 - 1.1 AU/mL  ANA 12 Profile, Do All RDL     Status: Abnormal   Collection Time: 12/02/23 11:33 AM  Result Value Ref Range   Anti-Nuclear Ab by IFA (RDL) Positive (A) Negative   Anti-Centromere Ab (RDL) <1:40 <1:40   Anti-dsDNA Ab by Farr(RDL) <8.0 <8.0 IU/mL  Anti-Sm Ab (RDL) <20 <20 Units   Anti-U1 RNP Ab (RDL) <20 <20 Units   Anti-Ro (SS-A) Ab (RDL) <20 <20 Units   Anti-La (SS-B) Ab (RDL) <20 <20 Units   Anti-Scl-70 Ab (RDL) <20 <20 Units   Anti-Cardiolipin Ab, IgG (RDL) 18 <15 GPL U/mL   Anti-Cardiolipin Ab, IgA (RDL) <12 <12 APL U/mL   Anti-Cardiolipin Ab, IgM (RDL) <13 <13 MPL U/mL   C3 Complement (RDL) 171 90 - 180 mg/dL   C4 Complement (RDL) 36 10 - 40 mg/dL    Comment:               **Please note reference interval change**   Anti-TPO Ab (RDL) 202.9 (H) <9.0 IU/mL    Comment:    Interpretation for Anti-Sm, Anti-U1 RNP, Anti-Ro,     Anti-La:         Negative:                                <20         Weak Positive:                       20 - 39         Moderate Positive:                   40 - 80         Strong Positive:                         >80         Strong Positive:                         >59    Interpretation for Anti-Cardiolipin Ab:     Negative:               GPL <15, APL <12, MPL <13     Indeterminate:    GPL 15-20, APL 12-20, MPL 13-20     Low Positive:           GPL, APL, MPL    >20 - 40     Med Positive:           GPL, APL, MPL    >40 - 80     High Positive:          GPL, APL, MPL         >80 SLE classification criteria are based on Med to High titer (>40) Anti-Cardiolipin Ab (aCL). aCL may be elevated transiently with certain infections and may increase spuriously in the presence of rheumatoid factor.   Rheumatoid Arthritis Profile     Status: None   Collection Time: 12/02/23 11:33 AM  Result Value Ref Range   Rheumatoid fact SerPl-aCnc <10.0 <14.0 IU/mL   Cyclic Citrullin Peptide Ab 5 0 - 19 units    Comment:                            Negative               <20                           Weak positive      20 - 39  Moderate positive  40 - 59                           Strong positive        >59   ANA Titer and Pattern     Status: Abnormal   Collection Time: 12/02/23 11:33 AM  Result Value Ref Range   Homogeneous Pattern 1:80 (H) <1:40   Note: Comment     Comment: ANA performed by Indirect Fluorescent Antibody (IFA)  Ehlers-Danlos Syndrome Panel     Status: None   Collection Time: 12/02/23 11:34 AM  Result Value Ref Range   Result Comment     Comment: PDF report to be sent separately       Assessment & Plan Localized swelling, mass and lump, right lower limb Getting Ultrasound of her RLE. Will await results to see if we can figure out what is going on.   Chronic midline low back pain with bilateral sciatica XR Lumbar spine today.  Will call with results when available.      Return in about 1 month (around 01/31/2024) for F/U.   Total time spent: 20 minutes  ALAN CHRISTELLA ARRANT, FNP  01/01/2024   This document may have been prepared by [facility] Voice Recognition software and as such may include unintentional dictation errors.

## 2024-01-04 ENCOUNTER — Ambulatory Visit
Admission: RE | Admit: 2024-01-04 | Discharge: 2024-01-04 | Disposition: A | Discharge status: Home / Self Care | Source: Ambulatory Visit | Attending: Family | Admitting: Family

## 2024-01-04 DIAGNOSIS — R2241 Localized swelling, mass and lump, right lower limb: Secondary | ICD-10-CM | POA: Insufficient documentation

## 2024-01-11 ENCOUNTER — Ambulatory Visit: Admitting: Family

## 2024-01-11 ENCOUNTER — Ambulatory Visit: Payer: Self-pay

## 2024-01-18 ENCOUNTER — Ambulatory Visit (INDEPENDENT_AMBULATORY_CARE_PROVIDER_SITE_OTHER): Admitting: Family

## 2024-01-18 ENCOUNTER — Encounter: Payer: Self-pay | Admitting: Family

## 2024-01-18 VITALS — BP 120/82 | HR 88 | Ht 68.0 in | Wt 294.2 lb

## 2024-01-18 DIAGNOSIS — L509 Urticaria, unspecified: Secondary | ICD-10-CM

## 2024-01-18 DIAGNOSIS — Z0130 Encounter for examination of blood pressure without abnormal findings: Secondary | ICD-10-CM

## 2024-01-18 MED ORDER — PREDNISONE 10 MG PO TABS: 10.0000 mg | ORAL_TABLET | Freq: Every day | ORAL | 0 refills | Status: DC

## 2024-01-18 MED ORDER — METHYLPREDNISOLONE ACETATE 40 MG/ML IJ SUSP
40.0000 mg | Freq: Once | INTRAMUSCULAR | Status: AC
  Administered 2024-01-18: 40 mg via INTRAMUSCULAR

## 2024-01-18 MED ORDER — HYDROXYZINE HCL 10 MG PO TABS: 10.0000 mg | ORAL_TABLET | Freq: Three times a day (TID) | ORAL | 0 refills | Status: AC | PRN

## 2024-01-18 NOTE — Progress Notes (Signed)
 Established Patient Office Visit  Subjective:  Patient ID: Heather Jacobs, female    DOB: [birth year]  Age: 28 y.o. MRN: [id number]  Chief Complaint  Patient presents with   Rash    Possible hives    Rash This is a new problem. The current episode started in the past 7 days. The problem has been gradually improving since onset. The rash is diffuse. The rash is characterized by itchiness and blistering. It is unknown (sunlight, sunscreen (was at beach)) if there was an exposure to a precipitant. Past treatments include antibiotic cream, topical steroids and anti-itch cream. The treatment provided no relief. Her past medical history is significant for allergies.    No other concerns at this time.   Past Medical History:  Diagnosis Date   Ectopic pregnancy    Family history of ovarian cancer    3/22 cancer genetic testing letter sent   Ruptured right tubal ectopic pregnancy causing hemoperitoneum 07/18/2018   Supervision of normal first pregnancy, antepartum 10/10/2016   RUBELLA non immune, repeat @ 28 weeks      Past Surgical History:  Procedure Laterality Date   ARTHROSCOPIC REPAIR ACL     DILATION AND EVACUATION  06/27/2018   LAPAROSCOPIC BILATERAL SALPINGECTOMY Bilateral 03/07/2021   Procedure: LAPAROSCOPIC BILATERAL SALPINGECTOMY;  Surgeon: Arloa Lamar SQUIBB, MD;  Location: [facility];  Service: Gynecology;  Laterality: Bilateral;   LAPAROSCOPY N/A 07/18/2018   Procedure: LAPAROSCOPY DIAGNOSTIC,right salpingostomy;  Surgeon: Arloa Lamar SQUIBB, MD;  Location: [facility];  Service: Gynecology;  Laterality: N/A;  ectopic supected to be in blood loss    Social History   Socioeconomic History   Marital status: Married    Spouse name: Nature conservation officer   Number of children: 2   Years of education: Not on file   Highest education level: Not on file  Occupational History   Not on file  Tobacco Use   Smoking status: Never   Smokeless tobacco: Never  Vaping Use   Vaping status: Never Used   Substance and Sexual Activity   Alcohol use: Never   Drug use: Never   Sexual activity: Yes    Birth control/protection: I.U.D.  Other Topics Concern   Not on file  Social History Narrative   Not on file   Social Drivers of Health   Financial Resource Strain: Not on file  Food Insecurity: Not on file  Transportation Needs: Not on file  Physical Activity: Not on file  Stress: Not on file  Social Connections: Not on file  Intimate Partner Violence: Not on file    Family History  Problem Relation Age of Onset   Hypothyroidism Mother    Hashimoto's thyroiditis Mother    Diabetes Mellitus I Father    Heart defect Father        Aortic stenosis (born with it)   Ovarian cancer Paternal Aunt 85    Allergies  Allergen Reactions   Chlorhexidine  Hives and Other (See Comments)    States she's allergic   Clindamycin /Lincomycin Rash    Review of Systems  Skin:  Positive for rash.  All other systems reviewed and are negative.      Objective:   BP 120/82   Pulse 88   Ht 5' 8 (1.727 m)   Wt 294 lb 3.2 oz (133.4 kg)   SpO2 99%   BMI 44.73 kg/m   Vitals:   01/18/24 0921  BP: 120/82  Pulse: 88  Height: 5' 8 (1.727 m)  Weight: 294 lb 3.2  oz (133.4 kg)  SpO2: 99%  BMI (Calculated): 44.74    Physical Exam Vitals and nursing note reviewed.  Constitutional:      Appearance: Normal appearance. She is normal weight.  HENT:     Head: Normocephalic.  Eyes:     Extraocular Movements: Extraocular movements intact.     Conjunctiva/sclera: Conjunctivae normal.     Pupils: Pupils are equal, round, and reactive to light.  Cardiovascular:     Rate and Rhythm: Normal rate.  Pulmonary:     Effort: Pulmonary effort is normal.  Neurological:     General: No focal deficit present.     Mental Status: She is alert and oriented to person, place, and time. Mental status is at baseline.  Psychiatric:        Mood and Affect: Mood normal.        Behavior: Behavior normal.         Thought Content: Thought content normal.      No results found for any visits on 01/18/24.  Recent Results (from the past 2160 hours)  Lyme Disease Serology w/Reflex     Status: None   Collection Time: 10/27/23  2:20 PM  Result Value Ref Range   Lyme Total Antibody EIA Negative Negative    Comment: Lyme antibodies not detected. Reflex testing is not indicated. No laboratory evidence of infection with B. burgdorferi (Lyme disease). Negative results may occur in patients recently infected (less than or equal to 14 days) with B. burgdorferi.  If recent infection is suspected, repeat testing on a new sample collected in 7 to 14 days is recommended.   Spotted Fever Group Antibodies     Status: None   Collection Time: 10/27/23  2:20 PM  Result Value Ref Range   Spotted Fever Group IgG <1:64 Neg:<1:64   Spotted Fever Group IgM <1:64 Neg:<1:64   Result Comment Comment     Comment: Spotted Fever Group IgG serum endpoint titer of >=1:64 is suggestive of infection at an unknown time and may be a sign of either past infection or early response to a recent infection. Spotted Fever Group IgM titer of >=1:64 is regarded as probable evidence of recent or ongoing infection. A four-fold or greater increase in titer between two serum samples drawn 1-2 weeks apart and tested in parallel is the best serologic indicator of a recent rickettsial infection.   Intrinsic Factor Antibodies     Status: None   Collection Time: 10/27/23  2:20 PM  Result Value Ref Range   Intrinsic Factor Abs, Serum 1.1 0.0 - 1.1 AU/mL  ANA 12 Profile, Do All RDL     Status: Abnormal   Collection Time: 12/02/23 11:33 AM  Result Value Ref Range   Anti-Nuclear Ab by IFA (RDL) Positive (A) Negative   Anti-Centromere Ab (RDL) <1:40 <1:40   Anti-dsDNA Ab by Farr(RDL) <8.0 <8.0 IU/mL   Anti-Sm Ab (RDL) <20 <20 Units   Anti-U1 RNP Ab (RDL) <20 <20 Units   Anti-Ro (SS-A) Ab (RDL) <20 <20 Units   Anti-La (SS-B) Ab (RDL) <20  <20 Units   Anti-Scl-70 Ab (RDL) <20 <20 Units   Anti-Cardiolipin Ab, IgG (RDL) 18 <15 GPL U/mL   Anti-Cardiolipin Ab, IgA (RDL) <12 <12 APL U/mL   Anti-Cardiolipin Ab, IgM (RDL) <13 <13 MPL U/mL   C3 Complement (RDL) 171 90 - 180 mg/dL   C4 Complement (RDL) 36 10 - 40 mg/dL    Comment:               **  Please note reference interval change**   Anti-TPO Ab (RDL) 202.9 (H) <9.0 IU/mL    Comment:    Interpretation for Anti-Sm, Anti-U1 RNP, Anti-Ro,     Anti-La:         Negative:                                <20         Weak Positive:                       20 - 39         Moderate Positive:                   40 - 80         Strong Positive:                         >80         Strong Positive:                         >59    Interpretation for Anti-Cardiolipin Ab:     Negative:               GPL <15, APL <12, MPL <13     Indeterminate:    GPL 15-20, APL 12-20, MPL 13-20     Low Positive:           GPL, APL, MPL    >20 - 40     Med Positive:           GPL, APL, MPL    >40 - 80     High Positive:          GPL, APL, MPL         >80 SLE classification criteria are based on Med to High titer (>40) Anti-Cardiolipin Ab (aCL). aCL may be elevated transiently with certain infections and may increase spuriously in the presence of rheumatoid factor.   Rheumatoid Arthritis Profile     Status: None   Collection Time: 12/02/23 11:33 AM  Result Value Ref Range   Rheumatoid fact SerPl-aCnc <10.0 <14.0 IU/mL   Cyclic Citrullin Peptide Ab 5 0 - 19 units    Comment:                           Negative               <20                           Weak positive      20 - 39                           Moderate positive  40 - 59                           Strong positive        >59   ANA Titer and Pattern     Status: Abnormal   Collection Time: 12/02/23 11:33 AM  Result Value Ref Range   Homogeneous Pattern 1:80 (H) <1:40   Note: Comment     Comment: ANA performed by Indirect  Fluorescent Antibody (IFA)   Ehlers-Danlos Syndrome Panel     Status: None   Collection Time: 12/02/23 11:34 AM  Result Value Ref Range   Result Comment     Comment: PDF report to be sent separately       Assessment & Plan Hives Depo-medrol  given to pt today.  Sending RX for prednisone  as well as hydroxyzine .   Will recheck at follow up.     Return as scheduled, unless not improving.   Total time spent: 20 minutes  ALAN CHRISTELLA ARRANT, FNP  01/18/2024   This document may have been prepared by [facility] Voice Recognition software and as such may include unintentional dictation errors.

## 2024-01-29 ENCOUNTER — Other Ambulatory Visit

## 2024-01-29 DIAGNOSIS — D519 Vitamin B12 deficiency anemia, unspecified: Secondary | ICD-10-CM

## 2024-01-29 DIAGNOSIS — E041 Nontoxic single thyroid nodule: Secondary | ICD-10-CM

## 2024-01-29 DIAGNOSIS — R5383 Other fatigue: Secondary | ICD-10-CM

## 2024-01-29 DIAGNOSIS — E559 Vitamin D deficiency, unspecified: Secondary | ICD-10-CM

## 2024-01-29 DIAGNOSIS — E039 Hypothyroidism, unspecified: Secondary | ICD-10-CM

## 2024-01-29 DIAGNOSIS — I10 Essential (primary) hypertension: Secondary | ICD-10-CM

## 2024-01-30 LAB — LIPID PANEL
Chol/HDL Ratio: 3.5 ratio (ref 0.0–4.4)
Cholesterol, Total: 146 mg/dL (ref 100–199)
HDL: 42 mg/dL (ref >39)
LDL Chol Calc (NIH): 93 mg/dL (ref 0–99)
Triglycerides: 51 mg/dL (ref 0–149)
VLDL Cholesterol Cal: 11 mg/dL (ref 5–40)

## 2024-01-30 LAB — CMP14+EGFR
ALT: 22 IU/L (ref 0–32)
AST: 22 IU/L (ref 0–40)
Albumin: 4.1 g/dL (ref 4.0–5.0)
Alkaline Phosphatase: 88 IU/L (ref 41–116)
BUN/Creatinine Ratio: 9 (ref 9–23)
BUN: 8 mg/dL (ref 6–20)
Bilirubin Total: 0.3 mg/dL (ref 0.0–1.2)
CO2: 24 mmol/L (ref 20–29)
Calcium: 9.3 mg/dL (ref 8.7–10.2)
Chloride: 101 mmol/L (ref 96–106)
Creatinine, Ser: 0.89 mg/dL (ref 0.57–1.00)
Globulin, Total: 2.7 g/dL (ref 1.5–4.5)
Glucose: 106 mg/dL — ABNORMAL HIGH (ref 70–99)
Potassium: 5.1 mmol/L (ref 3.5–5.2)
Sodium: 138 mmol/L (ref 134–144)
Total Protein: 6.8 g/dL (ref 6.0–8.5)
eGFR: 90 mL/min/1.73 (ref >59)

## 2024-01-30 LAB — VITAMIN D 25 HYDROXY (VIT D DEFICIENCY, FRACTURES): Vit D, 25-Hydroxy: 29.1 ng/mL — ABNORMAL LOW (ref 30.0–100.0)

## 2024-01-30 LAB — HEMOGLOBIN A1C
Est. average glucose Bld gHb Est-mCnc: 103 mg/dL
Hgb A1c MFr Bld: 5.2 % (ref 4.8–5.6)

## 2024-01-30 LAB — TSH: TSH: 5.38 u[IU]/mL — ABNORMAL HIGH (ref 0.450–4.500)

## 2024-01-30 LAB — VITAMIN B12: Vitamin B-12: 1012 pg/mL (ref 232–1245)

## 2024-02-01 ENCOUNTER — Ambulatory Visit: Admitting: Family

## 2024-02-01 ENCOUNTER — Ambulatory Visit: Payer: Self-pay

## 2024-02-01 ENCOUNTER — Encounter: Payer: Self-pay | Admitting: Family

## 2024-02-01 VITALS — BP 124/78 | HR 85 | Ht 68.0 in | Wt 294.6 lb

## 2024-02-01 DIAGNOSIS — R040 Epistaxis: Secondary | ICD-10-CM

## 2024-02-01 DIAGNOSIS — L509 Urticaria, unspecified: Secondary | ICD-10-CM

## 2024-02-01 DIAGNOSIS — Z131 Encounter for screening for diabetes mellitus: Secondary | ICD-10-CM

## 2024-02-01 DIAGNOSIS — Z0130 Encounter for examination of blood pressure without abnormal findings: Secondary | ICD-10-CM

## 2024-02-01 NOTE — Progress Notes (Signed)
 Established Patient Office Visit  Subjective:  Patient ID: Heather Jacobs, female    DOB: 1996/04/15  Age: 28 y.o. MRN: [id number]  Chief Complaint  Patient presents with   Follow-up    1 month follow up    Patient is here today for her 1 month follow up.  She has been feeling fairly well since last appointment.   She does have additional concerns to discuss today.  She has started having nosebleeds again, says the other day she had one significant enough that it ended up all over her car.  Says that she has been to ENT for them previously, but cannot remember if there were any tests done regarding clotting or bleeding issues.   Labs are not due today.  She needs refills.   I have reviewed her active problem list, medication list, allergies, notes from last encounter, lab results for her appointment today.      No other concerns at this time.   Past Medical History:  Diagnosis Date   Ectopic pregnancy    Family history of ovarian cancer    3/22 cancer genetic testing letter sent   Ruptured right tubal ectopic pregnancy causing hemoperitoneum 07/18/2018   Supervision of normal first pregnancy, antepartum 10/10/2016   RUBELLA non immune, repeat @ 28 weeks      Past Surgical History:  Procedure Laterality Date   ARTHROSCOPIC REPAIR ACL     DILATION AND EVACUATION  06/27/2018   LAPAROSCOPIC BILATERAL SALPINGECTOMY Bilateral 03/07/2021   Procedure: LAPAROSCOPIC BILATERAL SALPINGECTOMY;  Surgeon: Arloa Lamar SQUIBB, MD;  Location: [facility];  Service: Gynecology;  Laterality: Bilateral;   LAPAROSCOPY N/A 07/18/2018   Procedure: LAPAROSCOPY DIAGNOSTIC,right salpingostomy;  Surgeon: Arloa Lamar SQUIBB, MD;  Location: [facility];  Service: Gynecology;  Laterality: N/A;  ectopic supected to be in blood loss    Social History   Socioeconomic History   Marital status: Married    Spouse name: Nature conservation officer   Number of children: 2   Years of education: Not on file   Highest education  level: Not on file  Occupational History   Not on file  Tobacco Use   Smoking status: Never   Smokeless tobacco: Never  Vaping Use   Vaping status: Never Used  Substance and Sexual Activity   Alcohol use: Never   Drug use: Never   Sexual activity: Yes    Birth control/protection: I.U.D.  Other Topics Concern   Not on file  Social History Narrative   Not on file   Social Drivers of Health   Financial Resource Strain: Not on file  Food Insecurity: Not on file  Transportation Needs: Not on file  Physical Activity: Not on file  Stress: Not on file  Social Connections: Not on file  Intimate Partner Violence: Not on file    Family History  Problem Relation Age of Onset   Hypothyroidism Mother    Hashimoto's thyroiditis Mother    Diabetes Mellitus I Father    Heart defect Father        Aortic stenosis (born with it)   Ovarian cancer Paternal Aunt 58    Allergies  Allergen Reactions   Chlorhexidine  Hives and Other (See Comments)    States she's allergic   Clindamycin /Lincomycin Rash    Review of Systems  HENT:  Positive for nosebleeds.   All other systems reviewed and are negative.      Objective:   BP 124/78   Pulse 85   Ht 5' 8 (1.727  m)   Wt 294 lb 9.6 oz (133.6 kg)   SpO2 99%   BMI 44.79 kg/m   Vitals:   02/01/24 1040  BP: 124/78  Pulse: 85  Height: 5' 8 (1.727 m)  Weight: 294 lb 9.6 oz (133.6 kg)  SpO2: 99%  BMI (Calculated): 44.8    Physical Exam Vitals and nursing note reviewed.  Constitutional:      Appearance: Normal appearance. She is normal weight.  HENT:     Head: Normocephalic.  Eyes:     Extraocular Movements: Extraocular movements intact.     Conjunctiva/sclera: Conjunctivae normal.     Pupils: Pupils are equal, round, and reactive to light.  Cardiovascular:     Rate and Rhythm: Normal rate.  Pulmonary:     Effort: Pulmonary effort is normal.     Breath sounds: Normal breath sounds.  Neurological:     General: No focal  deficit present.     Mental Status: She is alert and oriented to person, place, and time. Mental status is at baseline.  Psychiatric:        Mood and Affect: Mood normal.        Behavior: Behavior normal.        Thought Content: Thought content normal.        Judgment: Judgment normal.      No results found for any visits on 02/01/24.  Recent Results (from the past 2160 hours)  ANA 12 Profile, Do All RDL     Status: Abnormal   Collection Time: 12/02/23 11:33 AM  Result Value Ref Range   Anti-Nuclear Ab by IFA (RDL) Positive (A) Negative   Anti-Centromere Ab (RDL) <1:40 <1:40   Anti-dsDNA Ab by Farr(RDL) <8.0 <8.0 IU/mL   Anti-Sm Ab (RDL) <20 <20 Units   Anti-U1 RNP Ab (RDL) <20 <20 Units   Anti-Ro (SS-A) Ab (RDL) <20 <20 Units   Anti-La (SS-B) Ab (RDL) <20 <20 Units   Anti-Scl-70 Ab (RDL) <20 <20 Units   Anti-Cardiolipin Ab, IgG (RDL) 18 <15 GPL U/mL   Anti-Cardiolipin Ab, IgA (RDL) <12 <12 APL U/mL   Anti-Cardiolipin Ab, IgM (RDL) <13 <13 MPL U/mL   C3 Complement (RDL) 171 90 - 180 mg/dL   C4 Complement (RDL) 36 10 - 40 mg/dL    Comment:               **Please note reference interval change**   Anti-TPO Ab (RDL) 202.9 (H) <9.0 IU/mL    Comment:    Interpretation for Anti-Sm, Anti-U1 RNP, Anti-Ro,     Anti-La:         Negative:                                <20         Weak Positive:                       20 - 39         Moderate Positive:                   40 - 80         Strong Positive:                         >80         Strong Positive:                         >  59    Interpretation for Anti-Cardiolipin Ab:     Negative:               GPL <15, APL <12, MPL <13     Indeterminate:    GPL 15-20, APL 12-20, MPL 13-20     Low Positive:           GPL, APL, MPL    >20 - 40     Med Positive:           GPL, APL, MPL    >40 - 80     High Positive:          GPL, APL, MPL         >80 SLE classification criteria are based on Med to High titer (>40) Anti-Cardiolipin Ab (aCL). aCL  may be elevated transiently with certain infections and may increase spuriously in the presence of rheumatoid factor.   Rheumatoid Arthritis Profile     Status: None   Collection Time: 12/02/23 11:33 AM  Result Value Ref Range   Rheumatoid fact SerPl-aCnc <10.0 <14.0 IU/mL   Cyclic Citrullin Peptide Ab 5 0 - 19 units    Comment:                           Negative               <20                           Weak positive      20 - 39                           Moderate positive  40 - 59                           Strong positive        >59   ANA Titer and Pattern     Status: Abnormal   Collection Time: 12/02/23 11:33 AM  Result Value Ref Range   Homogeneous Pattern 1:80 (H) <1:40   Note: Comment     Comment: ANA performed by Indirect Fluorescent Antibody (IFA)  Ehlers-Danlos Syndrome Panel     Status: None   Collection Time: 12/02/23 11:34 AM  Result Value Ref Range   Result Comment     Comment: PDF report to be sent separately  Vitamin D  (25 hydroxy)     Status: Abnormal   Collection Time: 01/29/24  9:38 AM  Result Value Ref Range   Vit D, 25-Hydroxy 29.1 (L) 30.0 - 100.0 ng/mL    Comment: Vitamin D  deficiency has been defined by the Institute of Medicine and an Endocrine Society practice guideline as a level of serum 25-OH vitamin D  less than 20 ng/mL (1,2). The Endocrine Society went on to further define vitamin D  insufficiency as a level between 21 and 29 ng/mL (2). 1. IOM (Institute of Medicine). 2010. Dietary reference    intakes for calcium and D. [place]: The    [employer]. 2. Holick MF, Binkley , Bischoff-Ferrari HA, et al.    Evaluation, treatment, and prevention of vitamin D     deficiency: an Endocrine Society clinical practice    guideline. JCEM. 2011 Jul; 96(7):1911-30.   Vitamin B12     Status: None   Collection Time: 01/29/24  9:38  AM  Result Value Ref Range   Vitamin B-12 1,012 232 - 1,245 pg/mL  Lipid panel     Status: None    Collection Time: 01/29/24  9:38 AM  Result Value Ref Range   Cholesterol, Total 146 100 - 199 mg/dL   Triglycerides 51 0 - 149 mg/dL   HDL 42 >60 mg/dL   VLDL Cholesterol Cal 11 5 - 40 mg/dL   LDL Chol Calc (NIH) 93 0 - 99 mg/dL   Chol/HDL Ratio 3.5 0.0 - 4.4 ratio    Comment:                                   T. Chol/HDL Ratio                                             Men  Women                               1/2 Avg.Risk  3.4    3.3                                   Avg.Risk  5.0    4.4                                2X Avg.Risk  9.6    7.1                                3X Avg.Risk 23.4   11.0   CMP14+EGFR     Status: Abnormal   Collection Time: 01/29/24  9:38 AM  Result Value Ref Range   Glucose 106 (H) 70 - 99 mg/dL   BUN 8 6 - 20 mg/dL   Creatinine, Ser 9.10 0.57 - 1.00 mg/dL   eGFR 90 >40 fO/fpw/8.26   BUN/Creatinine Ratio 9 9 - 23   Sodium 138 134 - 144 mmol/L   Potassium 5.1 3.5 - 5.2 mmol/L   Chloride 101 96 - 106 mmol/L   CO2 24 20 - 29 mmol/L   Calcium 9.3 8.7 - 10.2 mg/dL   Total Protein 6.8 6.0 - 8.5 g/dL   Albumin 4.1 4.0 - 5.0 g/dL   Globulin, Total 2.7 1.5 - 4.5 g/dL   Bilirubin Total 0.3 0.0 - 1.2 mg/dL   Alkaline Phosphatase 88 41 - 116 IU/L   AST 22 0 - 40 IU/L   ALT 22 0 - 32 IU/L  TSH     Status: Abnormal   Collection Time: 01/29/24  9:38 AM  Result Value Ref Range   TSH 5.380 (H) 0.450 - 4.500 uIU/mL  Hemoglobin A1c     Status: None   Collection Time: 01/29/24  9:38 AM  Result Value Ref Range   Hgb A1c MFr Bld 5.2 4.8 - 5.6 %    Comment:          Prediabetes: 5.7 - 6.4          Diabetes: >6.4          Glycemic control for adults  with diabetes: <7.0    Est. average glucose Bld gHb Est-mCnc 103 mg/dL       Assessment & Plan Epistaxis, recurrent Checking labs for clotting disorders today.  Will call with results when available.   Hives Hives have nearly resolved since previous appointment, will reassess as needed.    Return in about 2  months (around 04/02/2024) for CPE/PAP.   Total time spent: 20 minutes  ALAN CHRISTELLA ARRANT, FNP  02/01/2024   This document may have been prepared by [facility] Voice Recognition software and as such may include unintentional dictation errors.

## 2024-02-02 LAB — T3: T3, Total: 116 ng/dL (ref 71–180)

## 2024-02-02 LAB — T4: T4, Total: 6.8 ug/dL (ref 4.5–12.0)

## 2024-02-02 LAB — SPECIMEN STATUS REPORT

## 2024-02-04 LAB — APTT: aPTT: 29 s (ref 24–33)

## 2024-02-04 LAB — PROTEIN C DEFICIENCY PROFILE
Protein C Activity: 136 % (ref 73–180)
Protein C Antigen: 151 % — ABNORMAL HIGH (ref 60–150)

## 2024-02-04 LAB — FACTOR 5 LEIDEN

## 2024-02-04 LAB — PROTIME-INR
INR: 0.9 (ref 0.9–1.2)
Prothrombin Time: 10.1 s (ref 9.1–12.0)

## 2024-02-22 ENCOUNTER — Ambulatory Visit: Admitting: Cardiovascular Disease

## 2024-02-29 ENCOUNTER — Encounter: Payer: Self-pay | Admitting: Cardiovascular Disease

## 2024-02-29 ENCOUNTER — Ambulatory Visit: Admitting: Cardiovascular Disease

## 2024-02-29 VITALS — BP 122/74 | HR 98 | Ht 68.0 in | Wt 299.6 lb

## 2024-02-29 DIAGNOSIS — R0789 Other chest pain: Secondary | ICD-10-CM | POA: Diagnosis not present

## 2024-02-29 DIAGNOSIS — R002 Palpitations: Secondary | ICD-10-CM | POA: Diagnosis not present

## 2024-02-29 DIAGNOSIS — I471 Supraventricular tachycardia, unspecified: Secondary | ICD-10-CM

## 2024-02-29 DIAGNOSIS — Z0130 Encounter for examination of blood pressure without abnormal findings: Secondary | ICD-10-CM

## 2024-02-29 DIAGNOSIS — Z131 Encounter for screening for diabetes mellitus: Secondary | ICD-10-CM

## 2024-02-29 DIAGNOSIS — I340 Nonrheumatic mitral (valve) insufficiency: Secondary | ICD-10-CM | POA: Diagnosis not present

## 2024-02-29 NOTE — Progress Notes (Signed)
 Cardiology Office Note   Date:  02/29/2024   ID:  Heather Jacobs, DOB July 16, 1995, MRN [id number]  PCP:  Orlean Alan HERO, FNP  Cardiologist:  Denyse Bathe, MD      History of Present Illness: Heather Jacobs is a 28 y.o. female who presents for  Chief Complaint  Patient presents with   Follow-up    3 month follow up    Feeling fine, no chest pain or SOB.      Past Medical History:  Diagnosis Date   Ectopic pregnancy    Family history of ovarian cancer    3/22 cancer genetic testing letter sent   Ruptured right tubal ectopic pregnancy causing hemoperitoneum 07/18/2018   Supervision of normal first pregnancy, antepartum 10/10/2016   RUBELLA non immune, repeat @ 28 weeks       Past Surgical History:  Procedure Laterality Date   ARTHROSCOPIC REPAIR ACL     DILATION AND EVACUATION  06/27/2018   LAPAROSCOPIC BILATERAL SALPINGECTOMY Bilateral 03/07/2021   Procedure: LAPAROSCOPIC BILATERAL SALPINGECTOMY;  Surgeon: Arloa Lamar SQUIBB, MD;  Location: [facility];  Service: Gynecology;  Laterality: Bilateral;   LAPAROSCOPY N/A 07/18/2018   Procedure: LAPAROSCOPY DIAGNOSTIC,right salpingostomy;  Surgeon: Arloa Lamar SQUIBB, MD;  Location: [facility];  Service: Gynecology;  Laterality: N/A;  ectopic supected to be in blood loss     Current Outpatient Medications  Medication Sig Dispense Refill   butalbital-acetaminophen -caffeine (FIORICET) 50-325-40 MG tablet Take 1 tablet by mouth every 4 (four) hours as needed (has needed for migraines).     flecainide  (TAMBOCOR ) 100 MG tablet Take 1 tablet (100 mg total) by mouth 2 (two) times daily. 60 tablet 11   hydrOXYzine  (ATARAX ) 10 MG tablet Take 1 tablet (10 mg total) by mouth 3 (three) times daily as needed. 30 tablet 0   levothyroxine  (SYNTHROID ) 75 MCG tablet TAKE 1 TABLET(75 MCG) BY MOUTH DAILY BEFORE BREAKFAST 90 tablet 1   metoprolol  succinate (TOPROL  XL) 100 MG 24 hr tablet Take 1 tablet (100 mg total) by mouth daily. Take with or  immediately following a meal. 30 tablet 11   nortriptyline (PAMELOR) 10 MG capsule 10 mg in the morning.     nortriptyline (PAMELOR) 50 MG capsule Take 50 mg by mouth at bedtime.     Ubrogepant 50 MG TABS Take 50-100mg  at headache onset. Can repeat after 2 hours if needed. Do not exceed 200mg  in 24 hour     venlafaxine XR (EFFEXOR-XR) 75 MG 24 hr capsule Take 150 mg by mouth daily with breakfast.     pantoprazole  (PROTONIX ) 40 MG tablet Take 1 tablet (40 mg total) by mouth daily. (Patient not taking: Reported on 02/29/2024) 30 tablet 1   predniSONE  (DELTASONE ) 10 MG tablet Take 1 tablet (10 mg total) by mouth daily with breakfast. (Patient not taking: Reported on 02/01/2024) 5 tablet 0   tirzepatide  (ZEPBOUND ) 5 MG/0.5ML Pen Inject 5 mg into the skin once a week. (Patient not taking: Reported on 02/01/2024) 2 mL 0   venlafaxine XR (EFFEXOR-XR) 75 MG 24 hr capsule Take 75 mg by mouth daily with breakfast. (Patient not taking: Reported on 02/29/2024)     No current facility-administered medications for this visit.    Allergies:   Chlorhexidine  and Clindamycin /lincomycin    Social History:   reports that she has never smoked. She has never used smokeless tobacco. She reports that she does not drink alcohol and does not use drugs.   Family History:  family history includes  Diabetes Mellitus I in her father; Hashimoto's thyroiditis in her mother; Heart defect in her father; Hypothyroidism in her mother; Ovarian cancer (age of onset: 101) in her paternal aunt.    ROS:     Review of Systems  Constitutional: Negative.   HENT: Negative.    Eyes: Negative.   Respiratory: Negative.    Gastrointestinal: Negative.   Genitourinary: Negative.   Musculoskeletal: Negative.   Skin: Negative.   Neurological: Negative.   Endo/Heme/Allergies: Negative.   Psychiatric/Behavioral: Negative.    All other systems reviewed and are negative.     All other systems are reviewed and negative.    PHYSICAL  EXAM: VS:  BP 122/74   Pulse 98   Ht 5' 8 (1.727 m)   Wt 299 lb 9.6 oz (135.9 kg)   SpO2 97%   BMI 45.55 kg/m  , BMI Body mass index is 45.55 kg/m. Last weight:  Wt Readings from Last 3 Encounters:  02/29/24 299 lb 9.6 oz (135.9 kg)  02/01/24 294 lb 9.6 oz (133.6 kg)  01/18/24 294 lb 3.2 oz (133.4 kg)     Physical Exam Constitutional:      Appearance: Normal appearance.  Cardiovascular:     Rate and Rhythm: Normal rate and regular rhythm.     Heart sounds: Normal heart sounds.  Pulmonary:     Effort: Pulmonary effort is normal.     Breath sounds: Normal breath sounds.  Musculoskeletal:     Right lower leg: No edema.     Left lower leg: No edema.  Neurological:     Mental Status: She is alert.       EKG:   Recent Labs: 01/29/2024: ALT 22; BUN 8; Creatinine, Ser 0.89; Potassium 5.1; Sodium 138; TSH 5.380    Lipid Panel    Component Value Date/Time   CHOL 146 01/29/2024 0938   TRIG 51 01/29/2024 0938   HDL 42 01/29/2024 0938   CHOLHDL 3.5 01/29/2024 0938   LDLCALC 93 01/29/2024 0938      Other studies Reviewed: Additional studies/ records that were reviewed today include:  Review of the above records demonstrates:      01/26/2019    8:41 AM  PAD Screen  Previous PAD dx? No  Previous surgical procedure? No  Pain with walking? No  Feet/toe relief with dangling? No  Painful, non-healing ulcers? No  Extremities discolored? No      ASSESSMENT AND PLAN:    ICD-10-CM   1. SVT (supraventricular tachycardia)  I47.10    Flecanide, metoprolol  caused no further episode.    2. Palpitations  R00.2     3. Atypical chest pain  R07.89     4. Nonrheumatic mitral valve regurgitation  I34.0     5. Other chest pain  R07.89        Problem List Items Addressed This Visit       Other   Palpitations   Atypical chest pain   Other Visit Diagnoses       SVT (supraventricular tachycardia)    -  Primary   Flecanide, metoprolol  caused no further episode.      Nonrheumatic mitral valve regurgitation         Other chest pain              Disposition:   Return in about 3 months (around 05/31/2024).    Total time spent: 35 minutes  Signed,  Denyse Bathe, MD  02/29/2024 11:14 AM    [facility]

## 2024-04-04 ENCOUNTER — Ambulatory Visit: Admitting: Family

## 2024-04-29 ENCOUNTER — Ambulatory Visit: Admitting: Family

## 2024-05-04 ENCOUNTER — Other Ambulatory Visit

## 2024-05-04 DIAGNOSIS — R5383 Other fatigue: Secondary | ICD-10-CM

## 2024-05-04 DIAGNOSIS — E559 Vitamin D deficiency, unspecified: Secondary | ICD-10-CM

## 2024-05-04 DIAGNOSIS — E039 Hypothyroidism, unspecified: Secondary | ICD-10-CM

## 2024-05-04 DIAGNOSIS — E041 Nontoxic single thyroid nodule: Secondary | ICD-10-CM

## 2024-05-04 DIAGNOSIS — I10 Essential (primary) hypertension: Secondary | ICD-10-CM

## 2024-05-04 DIAGNOSIS — D519 Vitamin B12 deficiency anemia, unspecified: Secondary | ICD-10-CM

## 2024-05-05 ENCOUNTER — Ambulatory Visit: Payer: Self-pay

## 2024-05-05 LAB — CMP14+EGFR
ALT: 20 IU/L (ref 0–32)
AST: 21 IU/L (ref 0–40)
Albumin: 4 g/dL (ref 4.0–5.0)
Alkaline Phosphatase: 80 IU/L (ref 41–116)
BUN/Creatinine Ratio: 8 — ABNORMAL LOW (ref 9–23)
BUN: 6 mg/dL (ref 6–20)
Bilirubin Total: 0.3 mg/dL (ref 0.0–1.2)
CO2: 23 mmol/L (ref 20–29)
Calcium: 9.1 mg/dL (ref 8.7–10.2)
Chloride: 102 mmol/L (ref 96–106)
Creatinine, Ser: 0.78 mg/dL (ref 0.57–1.00)
Globulin, Total: 3 g/dL (ref 1.5–4.5)
Glucose: 90 mg/dL (ref 70–99)
Potassium: 4.4 mmol/L (ref 3.5–5.2)
Sodium: 139 mmol/L (ref 134–144)
Total Protein: 7 g/dL (ref 6.0–8.5)
eGFR: 105 mL/min/1.73

## 2024-05-05 LAB — LIPID PANEL
Chol/HDL Ratio: 3.2 ratio (ref 0.0–4.4)
Cholesterol, Total: 143 mg/dL (ref 100–199)
HDL: 45 mg/dL
LDL Chol Calc (NIH): 81 mg/dL (ref 0–99)
Triglycerides: 91 mg/dL (ref 0–149)
VLDL Cholesterol Cal: 17 mg/dL (ref 5–40)

## 2024-05-05 LAB — HEMOGLOBIN A1C
Est. average glucose Bld gHb Est-mCnc: 100 mg/dL
Hgb A1c MFr Bld: 5.1 % (ref 4.8–5.6)

## 2024-05-05 LAB — TSH: TSH: 4.7 u[IU]/mL — ABNORMAL HIGH (ref 0.450–4.500)

## 2024-05-05 LAB — VITAMIN B12: Vitamin B-12: 624 pg/mL (ref 232–1245)

## 2024-05-05 LAB — VITAMIN D 25 HYDROXY (VIT D DEFICIENCY, FRACTURES): Vit D, 25-Hydroxy: 31.9 ng/mL (ref 30.0–100.0)

## 2024-05-06 ENCOUNTER — Ambulatory Visit: Admitting: Family

## 2024-05-06 VITALS — Wt 296.8 lb

## 2024-05-06 DIAGNOSIS — R5383 Other fatigue: Secondary | ICD-10-CM

## 2024-05-06 DIAGNOSIS — Z131 Encounter for screening for diabetes mellitus: Secondary | ICD-10-CM

## 2024-05-06 DIAGNOSIS — G8929 Other chronic pain: Secondary | ICD-10-CM

## 2024-05-06 DIAGNOSIS — R2241 Localized swelling, mass and lump, right lower limb: Secondary | ICD-10-CM | POA: Diagnosis not present

## 2024-05-06 DIAGNOSIS — M25561 Pain in right knee: Secondary | ICD-10-CM | POA: Diagnosis not present

## 2024-05-08 ENCOUNTER — Encounter: Payer: Self-pay | Admitting: Family

## 2024-05-08 LAB — IRON,TIBC AND FERRITIN PANEL
Ferritin: 19 ng/mL (ref 15–150)
Iron Saturation: 19 % (ref 15–55)
Iron: 74 ug/dL (ref 27–159)
Total Iron Binding Capacity: 385 ug/dL (ref 250–450)
UIBC: 311 ug/dL (ref 131–425)

## 2024-05-08 LAB — SPECIMEN STATUS REPORT

## 2024-05-08 NOTE — Progress Notes (Signed)
 "  Established Patient Office Visit  Subjective:  Patient ID: Heather Jacobs, female    DOB: [birth year]  Age: 29 y.o. MRN: [id number]  No chief complaint on file.   Knee Pain  The incident occurred more than 1 week ago. The injury mechanism is unknown. The pain is present in the right knee. The quality of the pain is described as stabbing and aching. The pain is moderate. The pain has been Fluctuating since onset. Associated symptoms include an inability to bear weight. Associated symptoms comments: Lump on medial surface of knee/upper calf. She reports no foreign bodies present. The symptoms are aggravated by movement, weight bearing and palpation. She has tried acetaminophen , elevation, heat, ice, non-weight bearing, NSAIDs, rest and immobilization for the symptoms. The treatment provided no relief.    No other concerns at this time.   Past Medical History:  Diagnosis Date   Ectopic pregnancy    Family history of ovarian cancer    3/22 cancer genetic testing letter sent   Ruptured right tubal ectopic pregnancy causing hemoperitoneum 07/18/2018   Supervision of normal first pregnancy, antepartum 10/10/2016   RUBELLA non immune, repeat @ 28 weeks      Past Surgical History:  Procedure Laterality Date   ARTHROSCOPIC REPAIR ACL     DILATION AND EVACUATION  06/27/2018   LAPAROSCOPIC BILATERAL SALPINGECTOMY Bilateral 03/07/2021   Procedure: LAPAROSCOPIC BILATERAL SALPINGECTOMY;  Surgeon: Arloa Lamar SQUIBB, MD;  Location: [facility];  Service: Gynecology;  Laterality: Bilateral;   LAPAROSCOPY N/A 07/18/2018   Procedure: LAPAROSCOPY DIAGNOSTIC,right salpingostomy;  Surgeon: Arloa Lamar SQUIBB, MD;  Location: [facility];  Service: Gynecology;  Laterality: N/A;  ectopic supected to be in blood loss    Social History   Socioeconomic History   Marital status: Married    Spouse name: Nature Conservation Officer   Number of children: 2   Years of education: Not on file   Highest education level: Not on file   Occupational History   Not on file  Tobacco Use   Smoking status: Never   Smokeless tobacco: Never  Vaping Use   Vaping status: Never Used  Substance and Sexual Activity   Alcohol use: Never   Drug use: Never   Sexual activity: Yes    Birth control/protection: I.U.D.  Other Topics Concern   Not on file  Social History Narrative   Not on file   Social Drivers of Health   Tobacco Use: Low Risk  (03/31/2024)   Received from [facility]   Patient History    Smoking Tobacco Use: Never    Smokeless Tobacco Use: Never    Passive Exposure: Not on file  Financial Resource Strain: Not on file  Food Insecurity: Not on file  Transportation Needs: Not on file  Physical Activity: Not on file  Stress: Not on file  Social Connections: Not on file  Intimate Partner Violence: Not on file  Depression (PHQ2-9): Medium Risk (06/12/2023)   Depression (PHQ2-9)    PHQ-2 Score: 7  Alcohol Screen: Not on file  Housing: Not on file  Utilities: Not on file  Health Literacy: Not on file    Family History  Problem Relation Age of Onset   Hypothyroidism Mother    Hashimoto's thyroiditis Mother    Diabetes Mellitus I Father    Heart defect Father        Aortic stenosis (born with it)   Ovarian cancer Paternal Aunt 60    Allergies[1]  Review of Systems  All other  systems reviewed and are negative.      Objective:   There were no vitals taken for this visit.  There were no vitals filed for this visit.  Physical Exam Vitals and nursing note reviewed.  Constitutional:      Appearance: Normal appearance. She is normal weight.  HENT:     Head: Normocephalic.  Eyes:     Extraocular Movements: Extraocular movements intact.     Conjunctiva/sclera: Conjunctivae normal.     Pupils: Pupils are equal, round, and reactive to light.  Cardiovascular:     Rate and Rhythm: Normal rate.  Pulmonary:     Effort: Pulmonary effort is normal.  Neurological:     General: No  focal deficit present.     Mental Status: She is alert and oriented to person, place, and time. Mental status is at baseline.  Psychiatric:        Mood and Affect: Mood normal.        Behavior: Behavior normal.        Thought Content: Thought content normal.      No results found for any visits on 05/06/24.  Recent Results (from the past 2160 hours)  CMP14+EGFR     Status: Abnormal   Collection Time: 05/04/24  1:39 PM  Result Value Ref Range   Glucose 90 70 - 99 mg/dL   BUN 6 6 - 20 mg/dL   Creatinine, Ser 9.21 0.57 - 1.00 mg/dL   eGFR 894 >40 fO/fpw/8.26   BUN/Creatinine Ratio 8 (L) 9 - 23   Sodium 139 134 - 144 mmol/L   Potassium 4.4 3.5 - 5.2 mmol/L   Chloride 102 96 - 106 mmol/L   CO2 23 20 - 29 mmol/L   Calcium 9.1 8.7 - 10.2 mg/dL   Total Protein 7.0 6.0 - 8.5 g/dL   Albumin 4.0 4.0 - 5.0 g/dL   Globulin, Total 3.0 1.5 - 4.5 g/dL   Bilirubin Total 0.3 0.0 - 1.2 mg/dL   Alkaline Phosphatase 80 41 - 116 IU/L   AST 21 0 - 40 IU/L   ALT 20 0 - 32 IU/L  Lipid panel     Status: None   Collection Time: 05/04/24  1:39 PM  Result Value Ref Range   Cholesterol, Total 143 100 - 199 mg/dL   Triglycerides 91 0 - 149 mg/dL   HDL 45 >60 mg/dL   VLDL Cholesterol Cal 17 5 - 40 mg/dL   LDL Chol Calc (NIH) 81 0 - 99 mg/dL   Chol/HDL Ratio 3.2 0.0 - 4.4 ratio    Comment:                                   T. Chol/HDL Ratio                                             Men  Women                               1/2 Avg.Risk  3.4    3.3                                   Avg.Risk  5.0    4.4                                2X Avg.Risk  9.6    7.1                                3X Avg.Risk 23.4   11.0   Vitamin D  (25 hydroxy)     Status: None   Collection Time: 05/04/24  1:39 PM  Result Value Ref Range   Vit D, 25-Hydroxy 31.9 30.0 - 100.0 ng/mL    Comment: Vitamin D  deficiency has been defined by the Institute of Medicine and an Endocrine Society practice guideline as a level of serum  25-OH vitamin D  less than 20 ng/mL (1,2). The Endocrine Society went on to further define vitamin D  insufficiency as a level between 21 and 29 ng/mL (2). 1. IOM (Institute of Medicine). 2010. Dietary reference    intakes for calcium and D. [place]: The    [employer]. 2. Holick MF, Binkley [place], Bischoff-Ferrari HA, et al.    Evaluation, treatment, and prevention of vitamin D     deficiency: an Endocrine Society clinical practice    guideline. JCEM. 2011 Jul; 96(7):1911-30.   Vitamin B12     Status: None   Collection Time: 05/04/24  1:39 PM  Result Value Ref Range   Vitamin B-12 624 232 - 1,245 pg/mL  Hemoglobin A1c     Status: None   Collection Time: 05/04/24  1:39 PM  Result Value Ref Range   Hgb A1c MFr Bld 5.1 4.8 - 5.6 %    Comment:          Prediabetes: 5.7 - 6.4          Diabetes: >6.4          Glycemic control for adults with diabetes: <7.0    Est. average glucose Bld gHb Est-mCnc 100 mg/dL  TSH     Status: Abnormal   Collection Time: 05/04/24  1:39 PM  Result Value Ref Range   TSH 4.700 (H) 0.450 - 4.500 uIU/mL       Assessment & Plan Localized swelling, mass and lump, right lower limb Chronic pain of right knee Given continued issues with her knee, despite exercises at home and conservative measures.  Previous imaging has been non-diagnostic, so I think it is time to consider an MRI.  I will order this today and work to get it approved.   Other fatigue Checking iron panel today.  Will call with results when available.     Return in about 2 months (around 07/04/2024).   Total time spent: 20 minutes  ALAN CHRISTELLA ARRANT, FNP  05/06/2024   This document may have been prepared by [facility] Voice Recognition software and as such may include unintentional dictation errors.     [1]  Allergies Allergen Reactions   Chlorhexidine  Hives and Other (See Comments)    States she's allergic   Clindamycin /Lincomycin Rash   "

## 2024-05-08 NOTE — Assessment & Plan Note (Signed)
 Checking iron panel today.  Will call with results when available.

## 2024-05-11 ENCOUNTER — Encounter: Payer: Self-pay | Admitting: Family

## 2024-05-16 ENCOUNTER — Other Ambulatory Visit: Payer: Self-pay

## 2024-05-16 MED ORDER — ALBUTEROL SULFATE HFA 108 (90 BASE) MCG/ACT IN AERS: 2.0000 | INHALATION_SPRAY | Freq: Four times a day (QID) | RESPIRATORY_TRACT | 2 refills | Status: AC | PRN

## 2024-05-23 ENCOUNTER — Ambulatory Visit: Admitting: Family

## 2024-05-31 ENCOUNTER — Ambulatory Visit: Admitting: Family

## 2024-06-01 ENCOUNTER — Inpatient Hospital Stay
Admission: RE | Admit: 2024-06-01 | Discharge: 2024-06-01 | Disposition: A | Source: Ambulatory Visit | Attending: Family | Admitting: Family

## 2024-06-01 DIAGNOSIS — G8929 Other chronic pain: Secondary | ICD-10-CM

## 2024-06-01 DIAGNOSIS — R2241 Localized swelling, mass and lump, right lower limb: Secondary | ICD-10-CM

## 2024-06-03 ENCOUNTER — Encounter: Payer: Self-pay | Admitting: Cardiovascular Disease

## 2024-06-03 ENCOUNTER — Ambulatory Visit: Admitting: Cardiovascular Disease

## 2024-06-03 VITALS — BP 108/70 | HR 74 | Ht 68.0 in | Wt 302.4 lb

## 2024-06-03 DIAGNOSIS — I340 Nonrheumatic mitral (valve) insufficiency: Secondary | ICD-10-CM

## 2024-06-03 DIAGNOSIS — I471 Supraventricular tachycardia, unspecified: Secondary | ICD-10-CM

## 2024-06-03 DIAGNOSIS — I10 Essential (primary) hypertension: Secondary | ICD-10-CM

## 2024-06-03 DIAGNOSIS — R0789 Other chest pain: Secondary | ICD-10-CM

## 2024-06-03 DIAGNOSIS — R002 Palpitations: Secondary | ICD-10-CM

## 2024-06-03 MED ORDER — METOPROLOL SUCCINATE ER 100 MG PO TB24: 100.0000 mg | ORAL_TABLET | Freq: Every day | ORAL | 11 refills | Status: AC

## 2024-06-03 MED ORDER — FLECAINIDE ACETATE 100 MG PO TABS: 100.0000 mg | ORAL_TABLET | Freq: Two times a day (BID) | ORAL | 11 refills | Status: AC

## 2024-06-03 NOTE — Progress Notes (Signed)
 "     Cardiology Office Note   Date:  06/03/2024   ID:  Hetvi Shawhan, DOB 07-Mar-1996, MRN [id number]  PCP:  Orlean Alan HERO, FNP  Cardiologist:  Denyse Bathe, MD      History of Present Illness: Heather Jacobs is a 29 y.o. female who presents for  Chief Complaint  Patient presents with   Follow-up    3 month follow up    No chest pains and palpitation. But had DOE climbing and hiking.      Past Medical History:  Diagnosis Date   Ectopic pregnancy    Family history of ovarian cancer    3/22 cancer genetic testing letter sent   Ruptured right tubal ectopic pregnancy causing hemoperitoneum 07/18/2018   Supervision of normal first pregnancy, antepartum 10/10/2016   RUBELLA non immune, repeat @ 28 weeks       Past Surgical History:  Procedure Laterality Date   ARTHROSCOPIC REPAIR ACL     DILATION AND EVACUATION  06/27/2018   LAPAROSCOPIC BILATERAL SALPINGECTOMY Bilateral 03/07/2021   Procedure: LAPAROSCOPIC BILATERAL SALPINGECTOMY;  Surgeon: Arloa Lamar SQUIBB, MD;  Location: [facility];  Service: Gynecology;  Laterality: Bilateral;   LAPAROSCOPY N/A 07/18/2018   Procedure: LAPAROSCOPY DIAGNOSTIC,right salpingostomy;  Surgeon: Arloa Lamar SQUIBB, MD;  Location: [facility];  Service: Gynecology;  Laterality: N/A;  ectopic supected to be in blood loss     Current Outpatient Medications  Medication Sig Dispense Refill   albuterol  (VENTOLIN  HFA) 108 (90 Base) MCG/ACT inhaler Inhale 2 puffs into the lungs every 6 (six) hours as needed for wheezing or shortness of breath. 8 g 2   hydrOXYzine  (ATARAX ) 10 MG tablet Take 1 tablet (10 mg total) by mouth 3 (three) times daily as needed. 30 tablet 0   levothyroxine  (SYNTHROID ) 75 MCG tablet TAKE 1 TABLET(75 MCG) BY MOUTH DAILY BEFORE BREAKFAST 90 tablet 1   nortriptyline (PAMELOR) 10 MG capsule 10 mg in the morning.     nortriptyline (PAMELOR) 50 MG capsule Take 50 mg by mouth at bedtime.     venlafaxine XR (EFFEXOR-XR) 75 MG 24 hr  capsule Take 150 mg by mouth daily with breakfast.     flecainide  (TAMBOCOR ) 100 MG tablet Take 1 tablet (100 mg total) by mouth 2 (two) times daily. 60 tablet 11   metoprolol  succinate (TOPROL  XL) 100 MG 24 hr tablet Take 1 tablet (100 mg total) by mouth daily. Take with or immediately following a meal. 30 tablet 11   Ubrogepant 50 MG TABS Take 50-100mg  at headache onset. Can repeat after 2 hours if needed. Do not exceed 200mg  in 24 hour     venlafaxine XR (EFFEXOR-XR) 75 MG 24 hr capsule Take 75 mg by mouth daily with breakfast.     No current facility-administered medications for this visit.    Allergies:   Chlorhexidine  and Clindamycin /lincomycin    Social History:   reports that she has never smoked. She has never used smokeless tobacco. She reports that she does not drink alcohol and does not use drugs.   Family History:  family history includes Diabetes Mellitus I in her father; Hashimoto's thyroiditis in her mother; Heart defect in her father; Hypothyroidism in her mother; Ovarian cancer (age of onset: 63) in her paternal aunt.    ROS:     Review of Systems  Constitutional: Negative.   HENT: Negative.    Eyes: Negative.   Respiratory: Negative.    Gastrointestinal: Negative.   Genitourinary: Negative.   Musculoskeletal:  Negative.   Skin: Negative.   Neurological: Negative.   Endo/Heme/Allergies: Negative.   Psychiatric/Behavioral: Negative.    All other systems reviewed and are negative.     All other systems are reviewed and negative.    PHYSICAL EXAM: VS:  BP 108/70   Pulse 74   Ht 5' 8 (1.727 m)   Wt (!) 302 lb 6.4 oz (137.2 kg)   SpO2 98%   BMI 45.98 kg/m  , BMI Body mass index is 45.98 kg/m. Last weight:  Wt Readings from Last 3 Encounters:  06/03/24 (!) 302 lb 6.4 oz (137.2 kg)  05/08/24 296 lb 12.8 oz (134.6 kg)  02/29/24 299 lb 9.6 oz (135.9 kg)     Physical Exam Constitutional:      Appearance: Normal appearance.  Cardiovascular:     Rate  and Rhythm: Normal rate and regular rhythm.     Heart sounds: Normal heart sounds.  Pulmonary:     Effort: Pulmonary effort is normal.     Breath sounds: Normal breath sounds.  Musculoskeletal:     Right lower leg: No edema.     Left lower leg: No edema.  Neurological:     Mental Status: She is alert.       EKG:   Recent Labs: 05/04/2024: ALT 20; BUN 6; Creatinine, Ser 0.78; Potassium 4.4; Sodium 139; TSH 4.700    Lipid Panel    Component Value Date/Time   CHOL 143 05/04/2024 1339   TRIG 91 05/04/2024 1339   HDL 45 05/04/2024 1339   CHOLHDL 3.2 05/04/2024 1339   LDLCALC 81 05/04/2024 1339      Other studies Reviewed: Additional studies/ records that were reviewed today include:  Review of the above records demonstrates:      01/26/2019    8:41 AM  PAD Screen  Previous PAD dx? No  Previous surgical procedure? No  Pain with walking? No  Feet/toe relief with dangling? No  Painful, non-healing ulcers? No  Extremities discolored? No      ASSESSMENT AND PLAN:    ICD-10-CM   1. SVT (supraventricular tachycardia)  I47.10 PCV ECHOCARDIOGRAM COMPLETE    flecainide  (TAMBOCOR ) 100 MG tablet    metoprolol  succinate (TOPROL  XL) 100 MG 24 hr tablet    2. Nonrheumatic mitral valve regurgitation  I34.0 PCV ECHOCARDIOGRAM COMPLETE    flecainide  (TAMBOCOR ) 100 MG tablet    metoprolol  succinate (TOPROL  XL) 100 MG 24 hr tablet    3. Palpitations  R00.2 PCV ECHOCARDIOGRAM COMPLETE    flecainide  (TAMBOCOR ) 100 MG tablet    metoprolol  succinate (TOPROL  XL) 100 MG 24 hr tablet    4. Other chest pain  R07.89 PCV ECHOCARDIOGRAM COMPLETE    flecainide  (TAMBOCOR ) 100 MG tablet    5. SVT (supraventricular tachycardia)  I47.10 PCV ECHOCARDIOGRAM COMPLETE    flecainide  (TAMBOCOR ) 100 MG tablet    metoprolol  succinate (TOPROL  XL) 100 MG 24 hr tablet   In NSR    6. Primary hypertension  I10 metoprolol  succinate (TOPROL  XL) 100 MG 24 hr tablet    7. Palpitations  R00.2 PCV  ECHOCARDIOGRAM COMPLETE    flecainide  (TAMBOCOR ) 100 MG tablet    metoprolol  succinate (TOPROL  XL) 100 MG 24 hr tablet   change metoprolol  100 daily    8. Atypical chest pain  R07.89 metoprolol  succinate (TOPROL  XL) 100 MG 24 hr tablet   less CP with flecanide 100 bid       Problem List Items Addressed This Visit  Other   Palpitations   Relevant Medications   flecainide  (TAMBOCOR ) 100 MG tablet   metoprolol  succinate (TOPROL  XL) 100 MG 24 hr tablet   Other Relevant Orders   PCV ECHOCARDIOGRAM COMPLETE   Atypical chest pain   Relevant Medications   metoprolol  succinate (TOPROL  XL) 100 MG 24 hr tablet   Other Visit Diagnoses       SVT (supraventricular tachycardia)    -  Primary   Relevant Medications   flecainide  (TAMBOCOR ) 100 MG tablet   metoprolol  succinate (TOPROL  XL) 100 MG 24 hr tablet   Other Relevant Orders   PCV ECHOCARDIOGRAM COMPLETE     Nonrheumatic mitral valve regurgitation       Relevant Medications   flecainide  (TAMBOCOR ) 100 MG tablet   metoprolol  succinate (TOPROL  XL) 100 MG 24 hr tablet   Other Relevant Orders   PCV ECHOCARDIOGRAM COMPLETE     Other chest pain       Relevant Medications   flecainide  (TAMBOCOR ) 100 MG tablet   Other Relevant Orders   PCV ECHOCARDIOGRAM COMPLETE     SVT (supraventricular tachycardia)       In NSR   Relevant Medications   flecainide  (TAMBOCOR ) 100 MG tablet   metoprolol  succinate (TOPROL  XL) 100 MG 24 hr tablet   Other Relevant Orders   PCV ECHOCARDIOGRAM COMPLETE     Primary hypertension       Relevant Medications   flecainide  (TAMBOCOR ) 100 MG tablet   metoprolol  succinate (TOPROL  XL) 100 MG 24 hr tablet          Disposition:   Return in about 5 weeks (around 07/08/2024) for echo and f/u.    Total time spent: 35 minutes  Signed,  Denyse Bathe, MD  06/03/2024 10:15 AM    [facility] "

## 2024-06-24 ENCOUNTER — Other Ambulatory Visit

## 2024-07-01 ENCOUNTER — Ambulatory Visit: Admitting: Cardiovascular Disease

## 2024-07-04 ENCOUNTER — Ambulatory Visit: Admitting: Family
